# Patient Record
Sex: Male | Born: 1970 | Race: White | Hispanic: No | Smoking: Current every day smoker
Health system: Southern US, Community
[De-identification: ages and names within clinical notes are randomized; demographics above are authoritative.]

## PROBLEM LIST (undated history)

## (undated) DIAGNOSIS — R42 Dizziness and giddiness: Secondary | ICD-10-CM

## (undated) DIAGNOSIS — M359 Systemic involvement of connective tissue, unspecified: Secondary | ICD-10-CM

## (undated) DIAGNOSIS — T148XXA Other injury of unspecified body region, initial encounter: Secondary | ICD-10-CM

## (undated) DIAGNOSIS — M1990 Unspecified osteoarthritis, unspecified site: Secondary | ICD-10-CM

## (undated) DIAGNOSIS — Z8739 Personal history of other diseases of the musculoskeletal system and connective tissue: Secondary | ICD-10-CM

## (undated) DIAGNOSIS — I219 Acute myocardial infarction, unspecified: Secondary | ICD-10-CM

## (undated) DIAGNOSIS — I2510 Atherosclerotic heart disease of native coronary artery without angina pectoris: Secondary | ICD-10-CM

## (undated) DIAGNOSIS — J439 Emphysema, unspecified: Secondary | ICD-10-CM

## (undated) DIAGNOSIS — G629 Polyneuropathy, unspecified: Secondary | ICD-10-CM

## (undated) DIAGNOSIS — I2699 Other pulmonary embolism without acute cor pulmonale: Secondary | ICD-10-CM

## (undated) DIAGNOSIS — F329 Major depressive disorder, single episode, unspecified: Secondary | ICD-10-CM

## (undated) DIAGNOSIS — IMO0001 Reserved for inherently not codable concepts without codable children: Secondary | ICD-10-CM

## (undated) DIAGNOSIS — F32A Depression, unspecified: Secondary | ICD-10-CM

## (undated) DIAGNOSIS — I739 Peripheral vascular disease, unspecified: Secondary | ICD-10-CM

## (undated) DIAGNOSIS — I82409 Acute embolism and thrombosis of unspecified deep veins of unspecified lower extremity: Secondary | ICD-10-CM

## (undated) DIAGNOSIS — J45909 Unspecified asthma, uncomplicated: Secondary | ICD-10-CM

## (undated) HISTORY — DX: Peripheral vascular disease, unspecified: I73.9

## (undated) HISTORY — DX: Acute embolism and thrombosis of unspecified deep veins of unspecified lower extremity: I82.409

## (undated) HISTORY — PX: CORONARY ANGIOPLASTY WITH STENT PLACEMENT: SHX49

## (undated) HISTORY — PX: INGUINAL HERNIA REPAIR: SUR1180

## (undated) HISTORY — DX: Dizziness and giddiness: R42

## (undated) HISTORY — PX: CHEST TUBE INSERTION: SHX231

---

## 2004-04-23 ENCOUNTER — Emergency Department (HOSPITAL_COMMUNITY): Admission: EM | Admit: 2004-04-23 | Discharge: 2004-04-23 | Payer: Self-pay | Admitting: Emergency Medicine

## 2005-05-05 ENCOUNTER — Emergency Department (HOSPITAL_COMMUNITY): Admission: EM | Admit: 2005-05-05 | Discharge: 2005-05-05 | Payer: Self-pay | Admitting: Emergency Medicine

## 2010-04-16 DIAGNOSIS — I219 Acute myocardial infarction, unspecified: Secondary | ICD-10-CM

## 2010-04-16 HISTORY — PX: CARDIAC CATHETERIZATION: SHX172

## 2010-04-16 HISTORY — DX: Acute myocardial infarction, unspecified: I21.9

## 2010-09-28 ENCOUNTER — Inpatient Hospital Stay: Payer: Self-pay | Admitting: Internal Medicine

## 2010-09-28 DIAGNOSIS — R7989 Other specified abnormal findings of blood chemistry: Secondary | ICD-10-CM

## 2010-09-28 DIAGNOSIS — R079 Chest pain, unspecified: Secondary | ICD-10-CM

## 2010-09-29 DIAGNOSIS — I2510 Atherosclerotic heart disease of native coronary artery without angina pectoris: Secondary | ICD-10-CM

## 2010-10-06 ENCOUNTER — Ambulatory Visit (INDEPENDENT_AMBULATORY_CARE_PROVIDER_SITE_OTHER): Payer: Self-pay | Admitting: *Deleted

## 2010-10-06 ENCOUNTER — Encounter: Payer: Self-pay | Admitting: *Deleted

## 2010-10-06 ENCOUNTER — Telehealth: Payer: Self-pay | Admitting: Emergency Medicine

## 2010-10-06 VITALS — BP 132/88 | HR 59 | Ht 67.0 in | Wt 119.1 lb

## 2010-10-06 DIAGNOSIS — R0602 Shortness of breath: Secondary | ICD-10-CM

## 2010-10-06 DIAGNOSIS — I10 Essential (primary) hypertension: Secondary | ICD-10-CM

## 2010-10-06 NOTE — Telephone Encounter (Signed)
Spoke to pt, he had stent placed 1 week ago today after NSTEMI. Pt at this time c/o mild SOB, incr weakness, arms and legs feel slightly numb. Pt states last night he had chest pain, felt like he was having MI. Denies CP at this time. I have advised him to have his girlfriend bring him in to office now, will discuss with Dr. Shirlee Latch that is in our office today.

## 2010-10-06 NOTE — Patient Instructions (Signed)
Pt's BP WNL, EKG is unchanged from when in hospital NSR HR 59. Pt denies CP, SOB at this time. Does state he has some numbness "all over." Pt states last night he was cold/clammy and SOB. Pt was not on any BP meds prior to hospital. I have advised he purchase a BP machine and monitor over weekend, and if BP/HR low to hold Coreg. Pt states he will do this and will call back on Monday for follow up of symptoms. I have advised pt if CP/SOB return or he is unstable over weekend and NTG does not resolve that he call ambulance. Pt verbalized understanding and will continue ASA, Plavix, Lipitor and BP meds and will hold Coreg if low bp/hr. Pt is scheduled for f/u with Dr. Mariah Milling next Friday 10/13/10.

## 2010-10-06 NOTE — Telephone Encounter (Signed)
Pt stated that last night he felt like he was having another heart attack. Pt had a MI last week and Dr.Gollan saw him in hospital His vision was blurry, felt hot/sweating, chest pain and SOB. Pt stated girlfriend helped him to bed last night and he felt like he couldn't get there, stated may have blacked out. Pt doesn't remember much and he could not communicate with girlfriend on how he was feeling. Pt did not take any nitroglycerin last night.  Today patient feels very weak and SOB. Feels like legs are going numb.

## 2010-10-13 ENCOUNTER — Encounter: Payer: Self-pay | Admitting: Cardiovascular Disease

## 2010-10-13 ENCOUNTER — Ambulatory Visit (INDEPENDENT_AMBULATORY_CARE_PROVIDER_SITE_OTHER): Payer: Self-pay | Admitting: Cardiovascular Disease

## 2010-10-13 VITALS — BP 110/80 | HR 73 | Ht 67.0 in | Wt 121.0 lb

## 2010-10-13 DIAGNOSIS — I2510 Atherosclerotic heart disease of native coronary artery without angina pectoris: Secondary | ICD-10-CM

## 2010-10-13 DIAGNOSIS — F17200 Nicotine dependence, unspecified, uncomplicated: Secondary | ICD-10-CM | POA: Insufficient documentation

## 2010-10-13 MED ORDER — PRAVASTATIN SODIUM 20 MG PO TABS: 20.0000 mg | ORAL_TABLET | Freq: Every evening | ORAL | Status: DC

## 2010-10-13 NOTE — Assessment & Plan Note (Signed)
We have encouraged him to quit smoking. He smokes one half pack per day.

## 2010-10-13 NOTE — Assessment & Plan Note (Signed)
Recent stent placed to his proximal ramus branch. Bare metal stent placed as he does not have insurance. He continues to smoke. He is unable to afford Lipitor. We will start pravastatin 20 mg daily. Continue aspirin 325 for at least one month with Plavix indefinitely.

## 2010-10-13 NOTE — Progress Notes (Signed)
   [name], male    DOB: 04/12/1971, 40 y.o.   MRN: [id number]  HPI Comments: Edward [name] is a 40 year old gentleman who works in [employer], long smoking history who presented to [facility] June 14 with chest pain. Troponin was elevated to 0.78 with repeat troponin greater than 2. He was taken to the cardiac catheterization lab where he was found to have a severely stenotic 80% proximal ramus disease, 50% mid LAD disease. He had a bare-metal stent placed in his ramus vessel. He presents for routine followup to establish care in the clinic.  He reports that he had a difficult time tolerating Coreg and lisinopril. Initially he stopped his Coreg at the advice of our office and his lisinopril was cut in half. He was having dizziness and felt like he was going to pass out. He continued to feel poorly on half dose lisinopril. He stopped this several days ago and since then has felt back to normal. He is taking aspirin, Plavix, no cholesterol medicine as the Lipitor was too expensive.   He continues to smoke though has decreased the smoking from 2 packs a day to one half pack per day.  EKG shows normal sinus rhythm with rate 73 beats per minute with T wave abnormality in leads V3 through V6, one and aVL     Review of Systems  Constitutional: Negative.   HENT: Negative.   Eyes: Negative.   Respiratory: Negative.   Cardiovascular: Negative.   Gastrointestinal: Negative.   Musculoskeletal: Negative.   Skin: Negative.   Neurological: Negative.   Hematological: Negative.   Psychiatric/Behavioral: Negative.   All other systems reviewed and are negative.     BP 110/80  Pulse 73  Ht 5\' 7"  (1.702 m)  Wt 121 lb (54.885 kg)  BMI 18.95 kg/m2  Physical Exam  Nursing note and vitals reviewed. Constitutional: He is oriented to person, place, and time. He appears well-developed and well-nourished.  HENT:  Head: Normocephalic.  Nose: Nose normal.  Mouth/Throat: Oropharynx is  clear and moist.  Eyes: Conjunctivae are normal. Pupils are equal, round, and reactive to light.  Neck: Normal range of motion. Neck supple. No JVD present.  Cardiovascular: Normal rate, regular rhythm, S1 normal, S2 normal, normal heart sounds and intact distal pulses.  Exam reveals no gallop and no friction rub.   No murmur heard. Pulmonary/Chest: Effort normal and breath sounds normal. No respiratory distress. He has no wheezes. He has no rales. He exhibits no tenderness.  Abdominal: Soft. Bowel sounds are normal. He exhibits no distension. There is no tenderness.  Musculoskeletal: Normal range of motion. He exhibits no edema and no tenderness.  Lymphadenopathy:    He has no cervical adenopathy.  Neurological: He is alert and oriented to person, place, and time. Coordination normal.  Skin: Skin is warm and dry. No rash noted. No erythema.  Psychiatric: He has a normal mood and affect. His behavior is normal. Judgment and thought content normal.           Assessment and Plan

## 2010-10-13 NOTE — Patient Instructions (Signed)
You are doing well. Stop lipitor Start pravastatin 20 mg daily Decrease the aspirin to 81 mg x 2 in August, continue plavix Please call us if you have new issues that need to be addressed before your next appt.  We will call you for a follow up Appt. In 6 months

## 2010-10-20 ENCOUNTER — Encounter: Payer: Self-pay | Admitting: Cardiovascular Disease

## 2010-11-03 NOTE — Progress Notes (Signed)
  Pt's BP WNL, EKG is unchanged from when in hospital NSR HR 59. Pt denies CP, SOB at this time. Does state he has some numbness "all over." Pt states last night he was cold/clammy and SOB. Pt was not on any BP meds prior to hospital. I have advised he purchase a BP machine and monitor over weekend, and if BP/HR low to hold Coreg. Pt states he will do this and will call back on Monday for follow up of symptoms. I have advised pt if CP/SOB return or he is unstable over weekend and NTG does not resolve that he call ambulance. Pt verbalized understanding and will continue ASA, Plavix, Lipitor and BP meds and will hold Coreg if low bp/hr. Pt is scheduled for f/u with Dr. Mariah Milling next Friday 10/13/10.

## 2010-11-06 ENCOUNTER — Other Ambulatory Visit: Payer: Self-pay | Admitting: Cardiovascular Disease

## 2010-11-08 ENCOUNTER — Other Ambulatory Visit: Payer: Self-pay | Admitting: Cardiovascular Disease

## 2010-11-08 NOTE — Telephone Encounter (Signed)
Patient is completely out of this medication and states that he requested a refill several days ago.  Please call once this is complete.

## 2010-11-09 ENCOUNTER — Telehealth: Payer: Self-pay

## 2010-11-09 MED ORDER — CLOPIDOGREL BISULFATE 75 MG PO TABS: 75.0000 mg | ORAL_TABLET | Freq: Every day | ORAL | Status: DC

## 2010-11-09 NOTE — Telephone Encounter (Signed)
Needs a refill sent for plavix 75 mg one tablet daily.

## 2010-11-09 NOTE — Telephone Encounter (Signed)
Needs a refill on plavix 75 mg take one tablet daily.

## 2010-11-09 NOTE — Telephone Encounter (Signed)
Needs a refill on plavix.

## 2010-11-10 ENCOUNTER — Encounter: Payer: Self-pay | Admitting: Cardiovascular Disease

## 2011-05-02 ENCOUNTER — Ambulatory Visit (INDEPENDENT_AMBULATORY_CARE_PROVIDER_SITE_OTHER): Payer: Self-pay | Admitting: Cardiovascular Disease

## 2011-05-02 ENCOUNTER — Encounter: Payer: Self-pay | Admitting: Cardiovascular Disease

## 2011-05-02 VITALS — BP 120/82 | HR 74 | Ht 66.0 in | Wt 132.0 lb

## 2011-05-02 DIAGNOSIS — F17200 Nicotine dependence, unspecified, uncomplicated: Secondary | ICD-10-CM

## 2011-05-02 DIAGNOSIS — I2510 Atherosclerotic heart disease of native coronary artery without angina pectoris: Secondary | ICD-10-CM

## 2011-05-02 DIAGNOSIS — M109 Gout, unspecified: Secondary | ICD-10-CM | POA: Insufficient documentation

## 2011-05-02 DIAGNOSIS — I214 Non-ST elevation (NSTEMI) myocardial infarction: Secondary | ICD-10-CM

## 2011-05-02 DIAGNOSIS — E785 Hyperlipidemia, unspecified: Secondary | ICD-10-CM | POA: Insufficient documentation

## 2011-05-02 MED ORDER — COLCHICINE 0.6 MG PO TABS: 0.6000 mg | ORAL_TABLET | Freq: Three times a day (TID) | ORAL | Status: DC | PRN

## 2011-05-02 MED ORDER — CLOPIDOGREL BISULFATE 75 MG PO TABS: 75.0000 mg | ORAL_TABLET | Freq: Every day | ORAL | Status: DC

## 2011-05-02 MED ORDER — PRAVASTATIN SODIUM 20 MG PO TABS: 20.0000 mg | ORAL_TABLET | Freq: Every evening | ORAL | Status: DC

## 2011-05-02 NOTE — Assessment & Plan Note (Signed)
We have suggested we check his cholesterol and LFTs in the next few weeks at his convenience.

## 2011-05-02 NOTE — Assessment & Plan Note (Signed)
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 We have suggested he restart low-dose aspirin 81 mg daily with Plavix.  He does not want to take lisinopril or b-blockers. Risk and benefit was discussed with him.

## 2011-05-02 NOTE — Assessment & Plan Note (Signed)
He does report having a long history of gout in his big toe which is worse in the winter. He does not have a primary care physician. We have given him a prescription for colchicine to take p.r.n.. He did not tolerate NSAIDs such as aspirin in the past secondary to stomach irritation. We will avoid indomethacin. Also avoid steroids.

## 2011-05-02 NOTE — Assessment & Plan Note (Signed)
He continues to smoke one pack per day. We have encouraged him to continue to work on weaning his cigarettes and smoking cessation. He will continue to work on this and does not want any assistance with chantix.

## 2011-05-02 NOTE — Patient Instructions (Signed)
You are doing well. Please restart the aspirin 81 coated.  Please call us if you have new issues that need to be addressed before your next appt.  Your physician wants you to follow-up in: 6 months.  You will receive a reminder letter in the mail two months in advance. If you don't receive a letter, please call our office to schedule the follow-up appointment.

## 2011-05-02 NOTE — Progress Notes (Signed)
[name], male    DOB: Mar 01, 1971, 41 y.o.   MRN: [id number]  HPI Comments: Edward [name] is a 41 year old gentleman who works in [employer], long smoking history who presented to [facility] September 28 2010 with chest pain. Troponin was elevated to 0.78 with repeat troponin greater than 2. He was taken to the cardiac catheterization lab where he was found to have a severely stenotic 80% proximal ramus disease, 50% mid LAD disease. He had a bare-metal stent placed in his ramus vessel. He presents for routine followup.  He continues to smoke one pack per day. He does have occasional episodes of hand numbness and arm discomfort which was his previous anginal equivalent. It is not often but he does continue to have very mild symptoms. He is not very worried about the symptoms at this time. He continues to work long hours in [employer]. He stopped taking lisinopril secondary to not feeling well. He has not been continuing his Coreg despite our advice to continue a low dose.  He stopped aspirin as he reports it hurt his stomach. He has been taking Plavix, pravastatin 20 mg daily.  EKG shows normal sinus rhythm with rate 74 beats per minute with No significant ST or T wave changes   Outpatient Encounter Prescriptions as of 05/02/2011  Medication Sig Dispense Refill  . clopidogrel (PLAVIX) 75 MG tablet Take 1 tablet (75 mg total) by mouth daily.  30 tablet  12  . nitroGLYCERIN (NITROSTAT) 0.4 MG SL tablet Place 0.4 mg under the tongue every 5 (five) minutes as needed.        . pravastatin (PRAVACHOL) 20 MG tablet Take 1 tablet (20 mg total) by mouth every evening.  90 tablet  4     Review of Systems  Constitutional: Negative.   HENT: Negative.   Eyes: Negative.   Respiratory: Negative.   Cardiovascular: Negative.   Gastrointestinal: Negative.   Musculoskeletal: Negative.   Skin: Negative.   Neurological: Negative.   Hematological: Negative.   Psychiatric/Behavioral: Negative.    All other systems reviewed and are negative.     BP 120/82  Pulse 74  Ht 5\' 6"  (1.676 m)  Wt 132 lb (59.875 kg)  BMI 21.31 kg/m2  Physical Exam  Nursing note and vitals reviewed. Constitutional: He is oriented to person, place, and time. He appears well-developed and well-nourished.  HENT:  Head: Normocephalic.  Nose: Nose normal.  Mouth/Throat: Oropharynx is clear and moist.  Eyes: Conjunctivae are normal. Pupils are equal, round, and reactive to light.  Neck: Normal range of motion. Neck supple. No JVD present.  Cardiovascular: Normal rate, regular rhythm, S1 normal, S2 normal, normal heart sounds and intact distal pulses.  Exam reveals no gallop and no friction rub.   No murmur heard. Pulmonary/Chest: Effort normal and breath sounds normal. No respiratory distress. He has no wheezes. He has no rales. He exhibits no tenderness.  Abdominal: Soft. Bowel sounds are normal. He exhibits no distension. There is no tenderness.  Musculoskeletal: Normal range of motion. He exhibits no edema and no tenderness.  Lymphadenopathy:    He has no cervical adenopathy.  Neurological: He is alert and oriented to person, place, and time. Coordination normal.  Skin: Skin is warm and dry. No rash noted. No erythema.  Psychiatric: He has a normal mood and affect. His behavior is normal. Judgment and thought content normal.           Assessment and Plan

## 2011-06-22 ENCOUNTER — Emergency Department: Payer: Self-pay | Admitting: Emergency Medicine

## 2011-06-22 LAB — CBC
HCT: 46.3 % (ref 40.0–52.0)
HGB: 16.2 g/dL (ref 13.0–18.0)
MCH: 34 pg (ref 26.0–34.0)
MCHC: 34.9 g/dL (ref 32.0–36.0)
MCV: 97 fL (ref 80–100)
Platelet: 192 10*3/uL (ref 150–440)
RBC: 4.77 10*6/uL (ref 4.40–5.90)
RDW: 12.4 % (ref 11.5–14.5)
WBC: 6.7 10*3/uL (ref 3.8–10.6)

## 2011-06-22 LAB — BASIC METABOLIC PANEL
Anion Gap: 10 (ref 7–16)
BUN: 16 mg/dL (ref 7–18)
Calcium, Total: 9.1 mg/dL (ref 8.5–10.1)
Chloride: 106 mmol/L (ref 98–107)
Co2: 27 mmol/L (ref 21–32)
Creatinine: 1.11 mg/dL (ref 0.60–1.30)
EGFR (African American): >60
EGFR (Non-African Amer.): >60
Glucose: 84 mg/dL (ref 65–99)
Osmolality: 285 (ref 275–301)
Potassium: 3.9 mmol/L (ref 3.5–5.1)
Sodium: 143 mmol/L (ref 136–145)

## 2011-06-22 LAB — URINALYSIS, COMPLETE
Bilirubin,UR: NEGATIVE
Blood: NEGATIVE
Glucose,UR: NEGATIVE mg/dL (ref 0–75)
Ketone: NEGATIVE
Leukocyte Esterase: NEGATIVE
Nitrite: NEGATIVE
Ph: 7 (ref 4.5–8.0)
Protein: NEGATIVE
RBC,UR: NONE SEEN /HPF (ref 0–5)
Specific Gravity: 1.005 (ref 1.003–1.030)
Squamous Epithelial: <1
WBC UR: NONE SEEN /HPF (ref 0–5)

## 2011-07-03 ENCOUNTER — Telehealth: Payer: Self-pay | Admitting: Cardiovascular Disease

## 2011-07-03 NOTE — Telephone Encounter (Signed)
Will forward to Dr Gollan  

## 2011-07-03 NOTE — Telephone Encounter (Signed)
Pt needs surgical clearance for hernia repair. Per Dr Natale Lay ofc [phone] Fax  [phone]

## 2011-07-03 NOTE — Telephone Encounter (Signed)
Ok to have surgery, acceptable risk Will need to hold his Plavix 5 days before the surgery Would continue on aspirin through the surgery and postoperatively. Restart Plavix after surgery is complete

## 2011-07-04 NOTE — Telephone Encounter (Signed)
Faxed note to [facility].  N/A at pt number.

## 2011-07-05 NOTE — Telephone Encounter (Signed)
Spoke with Edward [name] regarding pt's surgical clearance. Dr. Windell Hummingbird note faxed to [phone].

## 2011-07-12 ENCOUNTER — Ambulatory Visit: Payer: Self-pay | Admitting: Surgery

## 2011-07-20 ENCOUNTER — Ambulatory Visit: Payer: Self-pay | Admitting: Surgery

## 2011-07-25 LAB — PATHOLOGY REPORT

## 2014-04-16 DIAGNOSIS — I2699 Other pulmonary embolism without acute cor pulmonale: Secondary | ICD-10-CM

## 2014-04-16 HISTORY — DX: Other pulmonary embolism without acute cor pulmonale: I26.99

## 2014-07-26 ENCOUNTER — Emergency Department (HOSPITAL_COMMUNITY)
Admission: EM | Admit: 2014-07-26 | Discharge: 2014-07-26 | Disposition: A | Discharge status: Home / Self Care | Payer: Self-pay | Attending: Emergency Medicine | Admitting: Emergency Medicine

## 2014-07-26 ENCOUNTER — Encounter (HOSPITAL_COMMUNITY): Payer: Self-pay | Admitting: Cardiology

## 2014-07-26 DIAGNOSIS — Z7902 Long term (current) use of antithrombotics/antiplatelets: Secondary | ICD-10-CM | POA: Insufficient documentation

## 2014-07-26 DIAGNOSIS — Z720 Tobacco use: Secondary | ICD-10-CM | POA: Insufficient documentation

## 2014-07-26 DIAGNOSIS — R202 Paresthesia of skin: Secondary | ICD-10-CM | POA: Insufficient documentation

## 2014-07-26 DIAGNOSIS — J439 Emphysema, unspecified: Secondary | ICD-10-CM | POA: Insufficient documentation

## 2014-07-26 DIAGNOSIS — Z79899 Other long term (current) drug therapy: Secondary | ICD-10-CM | POA: Insufficient documentation

## 2014-07-26 LAB — CBG MONITORING, ED: Glucose-Capillary: 69 mg/dL — ABNORMAL LOW (ref 70–99)

## 2014-07-26 NOTE — Discharge Instructions (Signed)
Your vital signs are within normal limits. There no gross neurologic or vascular changes noted on your examination at this time. Please see Dr. Romeo AppleHarrison for additional evaluation concerning the numbness sensation in your lower extremity. Paresthesia Paresthesia is a burning or prickling feeling. This feeling can happen in any part of the body. It often happens in the hands, arms, legs, or feet. HOME CARE  Avoid drinking alcohol.  Try massage or needle therapy (acupuncture) to help with your problems.  Keep all doctor visits as told. GET HELP RIGHT AWAY IF:   You feel weak.  You have trouble walking or moving.  You have problems speaking or seeing.  You feel confused.  You cannot control when you poop (bowel movement) or pee (urinate).  You lose feeling (numbness) after an injury.  You pass out (faint).  Your burning or prickling feeling gets worse when you walk.  You have pain, cramps, or feel dizzy.  You have a rash. MAKE SURE YOU:   Understand these instructions.  Will watch your condition.  Will get help right away if you are not doing well or get worse. Document Released: 03/15/2008 Document Revised: 06/25/2011 Document Reviewed: 12/22/2010 [facility] Patient Information 2015 [place]. This information is not intended to replace advice given to you by your health care provider. Make sure you discuss any questions you have with your health care provider.

## 2014-07-26 NOTE — ED Notes (Signed)
Right foot and lower leg pain since Saturday.  Denies any injury.

## 2014-07-26 NOTE — ED Provider Notes (Signed)
CSN: [id number]     Arrival date & time 07/26/14  1419 History  This chart was scribed for non-physician practitioner, Ivery Quale, working with Shon Baton, MD by Richarda Overlie, ED Scribe. This patient was seen in room APFT20/APFT20 and the patient's care was started at 2:44 PM.   Chief Complaint  Patient presents with  . Foot Pain   HPI HPI Comments: Edward [name] is a 44 y.o. male with a history of emphysema and MI who presents to the Emergency Department complaining of gradual, unchanged right foot pain that started 2 days ago. Pt describes his pain as throbbing. He denies any injury. Pt states that he started to experience a numbness sensation in his right calf and right foot at the time of onset that is still present. He reports that he has frequently experienced numbness in his hands and legs in the past but has never been diagnosed. Pt denies bowel incontinence during the onset of his symptoms. Pt denies any numbness in his rectum or pelvis. He reports no history of RLE surgery.   Past Medical History  Diagnosis Date  . Pneumothorax 2006    s/p chest tube placement x 2   . Tobacco abuse     25 year   . Acute bronchitis   . Emphysema    Past Surgical History  Procedure Laterality Date  . Cardiac catheterization  2012    s/p stent placement   History reviewed. No pertinent family history. History  Substance Use Topics  . Smoking status: Current Every Day Smoker -- 0.50 packs/day for 25 years    Types: Cigarettes  . Smokeless tobacco: Never Used  . Alcohol Use: No    Review of Systems  Musculoskeletal: Positive for arthralgias.  Neurological: Positive for numbness.  All other systems reviewed and are negative.  Allergies  Lisinopril  Home Medications   Prior to Admission medications   Medication Sig Start Date End Date Taking? Authorizing Provider  clopidogrel (PLAVIX) 75 MG tablet Take 1 tablet (75 mg total) by mouth daily. 05/02/11   Antonieta Iba, MD   colchicine 0.6 MG tablet Take 1 tablet (0.6 mg total) by mouth 3 (three) times daily as needed. 05/02/11 05/01/12  Antonieta Iba, MD  nitroGLYCERIN (NITROSTAT) 0.4 MG SL tablet Place 0.4 mg under the tongue every 5 (five) minutes as needed.      Historical Provider, MD  pravastatin (PRAVACHOL) 20 MG tablet Take 1 tablet (20 mg total) by mouth every evening. 05/02/11 05/01/12  Antonieta Iba, MD   BP 144/88 mmHg  Pulse 85  Temp(Src) 98.1 F (36.7 C) (Oral)  Resp 18  Ht  (1.676 m)  Wt 124 lb (56.246 kg)  BMI 20.02 kg/m2  SpO2 99%   Physical Exam  Constitutional: He is oriented to person, place, and time. He appears well-developed and well-nourished.  HENT:  Head: Normocephalic and atraumatic.  Eyes: Right eye exhibits no discharge. Left eye exhibits no discharge.  Neck: Neck supple. No tracheal deviation present.  Cardiovascular: Normal rate, regular rhythm and normal heart sounds.   Pulmonary/Chest: Effort normal. No respiratory distress. He has no wheezes. He has no rales.  Coarse breath sounds.   Abdominal: He exhibits no distension.  Musculoskeletal:  Right achilles tendon is intact. There are no temperature changes of RLE. No edema or redness. Good hair growth. Posterior tibial pulse and dorsalis pedis pulses are 2+. Capillary refill is less than 3 seconds. No temperature changes of the foot.  No lesions between the toes. No puncture wounds of the plantar surface.   Neurological: He is alert and oriented to person, place, and time.  Motor strength of lower extremities is symmetrical.  Skin: Skin is warm and dry.  Psychiatric: He has a normal mood and affect. His behavior is normal.  Nursing note and vitals reviewed.  ED Course  Procedures   DIAGNOSTIC STUDIES: Oxygen Saturation is 99% on RA, normal by my interpretation.    COORDINATION OF CARE: 2:56 PM Discussed treatment plan with pt at bedside and pt agreed to plan.   Labs Review Labs Reviewed - No data to  display  Imaging Review No results found.   EKG Interpretation None      MDM  No hx of DM. Hx of ETOH. 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 No medication changes. No injury or trauma. No redness or swelling of the Right Lower ext. Neg Homan's sign. Doubt DVT. DP and PT pulse 2+. No significant change if hair growth pattern.  No color changes or the lower extremities.Doubt circulation issue.  Pt to follow up with orthopedics for additional evaluation. He will return to ED if any changes or problem.    Final diagnoses:  None    **I have reviewed nursing notes, vital signs, and all appropriate lab and imaging results for this patient.     Ivery QualeHobson Jalani Cullifer, PA-C 07/28/14 [id number]  Ivery QualeHobson Duey Liller, PA-C 07/28/14 [id number]  Shon Batonourtney F Horton, MD 07/30/14 [phone]

## 2014-08-08 NOTE — Op Note (Signed)
PATIENT NAME:  Edward [name], Edward [name] MR#:  [id number] DATE OF BIRTH:  03/09/1971  DATE OF PROCEDURE:  07/20/2011  PREOPERATIVE DIAGNOSIS: Right inguinal hernia.   POSTOPERATIVE DIAGNOSIS: Small right indirect inguinal hernia.   PROCEDURE PERFORMED: Right groin exploration with high ligation of small indirect inguinal hernia AND resection of ilioinguinal nerve.   SURGEON: Natale LayMark Blythe Hartshorn, M.[name].   ANESTHESIA: General with LMA and 30 mL of 0.25% plain Marcaine.   SPECIMENS: As described above.   ESTIMATED BLOOD LOSS: Minimal.   DESCRIPTION OF PROCEDURE: With the patient in the supine position, general anesthesia was induced with LMA airway. The right groin was clipped of hair, prepped and draped with ChloraPrep followed by [name]. Time out procedure was observed. A short transversely oriented incision was fashioned in the right groin with scalpel and carried through Scarpa's fascia with electrocautery. Small bridging veins were controlled with point cautery. The external oblique aponeurosis was identified and incised laterally with scalpel. The incision was carried through the external ring. The spermatic cord was diminutive in size and elevated with blunt dissection, at the pubic tubercle. The floor of the inguinal canal was strong; no evidence of a direct inguinal hernia. Dissection of the cord structures demonstrated a diminutive lipoma. This was excised and discarded. Dissection in the area of an indirect inguinal hernia demonstrated a 2 cm hernia sac which was then opened, and approximately 5 mm in width, doubly ligated at its base, transected, and submitted as specimen. The course of the ilioinguinal nerve was identified, and a 4 cm segment was excised with the proximal aspect being suture ligated with 3-0 chromic suture. With lap and needle count correct x2, a total of 30 mL of 0.25% plain Marcaine was utilized during the case, including an ilioinguinal field block. The external oblique aponeurosis was then  reapproximated utilizing a running #0 Vicryl suture. Scarpa's fascia was reapproximated with 2-0 Vicryl suture. Deep dermal 3-0 Vicryl suture was placed in running fashion followed by running 4-0 Vicryl subcuticular in the skin, Benzoin, Steri-Strips, and an occlusive sterile dressing. The patient was then subsequently extubated and sent to the recovery room in stable and satisfactory condition by anesthesia services.  ____________________________ Redge GainerMark A. Egbert GaribaldiBird, MD mab:slb [name]: 07/20/2011 16:40:09 ET T: 07/21/2011 11:04:07 ET JOB#: [id number]  cc: Loraine LericheMark A. Egbert GaribaldiBird, MD, <Dictator> Raynald KempMARK A Zaniah Titterington MD ELECTRONICALLY SIGNED 07/21/2011 16:30

## 2014-09-17 ENCOUNTER — Emergency Department (HOSPITAL_COMMUNITY): Payer: PRIVATE HEALTH INSURANCE

## 2014-09-17 ENCOUNTER — Encounter (HOSPITAL_COMMUNITY): Payer: Self-pay

## 2014-09-17 ENCOUNTER — Inpatient Hospital Stay (HOSPITAL_COMMUNITY)
Admission: EM | Admit: 2014-09-17 | Discharge: 2014-09-18 | DRG: 176 | Disposition: A | Discharge status: Home / Self Care | Payer: PRIVATE HEALTH INSURANCE | Attending: Internal Medicine | Admitting: Internal Medicine

## 2014-09-17 DIAGNOSIS — F17210 Nicotine dependence, cigarettes, uncomplicated: Secondary | ICD-10-CM | POA: Diagnosis present

## 2014-09-17 DIAGNOSIS — E785 Hyperlipidemia, unspecified: Secondary | ICD-10-CM | POA: Diagnosis present

## 2014-09-17 DIAGNOSIS — I2699 Other pulmonary embolism without acute cor pulmonale: Principal | ICD-10-CM | POA: Diagnosis present

## 2014-09-17 DIAGNOSIS — I2510 Atherosclerotic heart disease of native coronary artery without angina pectoris: Secondary | ICD-10-CM | POA: Diagnosis present

## 2014-09-17 DIAGNOSIS — Z955 Presence of coronary angioplasty implant and graft: Secondary | ICD-10-CM | POA: Diagnosis not present

## 2014-09-17 DIAGNOSIS — R079 Chest pain, unspecified: Secondary | ICD-10-CM | POA: Diagnosis not present

## 2014-09-17 DIAGNOSIS — J439 Emphysema, unspecified: Secondary | ICD-10-CM | POA: Diagnosis present

## 2014-09-17 DIAGNOSIS — Z825 Family history of asthma and other chronic lower respiratory diseases: Secondary | ICD-10-CM

## 2014-09-17 DIAGNOSIS — I82409 Acute embolism and thrombosis of unspecified deep veins of unspecified lower extremity: Secondary | ICD-10-CM

## 2014-09-17 LAB — CBC WITH DIFFERENTIAL/PLATELET
Basophils Absolute: 0 10*3/uL (ref 0.0–0.1)
Basophils Relative: 0 % (ref 0–1)
Eosinophils Absolute: 0.2 10*3/uL (ref 0.0–0.7)
Eosinophils Relative: 2 % (ref 0–5)
HCT: 45.5 % (ref 39.0–52.0)
Hemoglobin: 15.2 g/dL (ref 13.0–17.0)
Lymphocytes Relative: 16 % (ref 12–46)
Lymphs Abs: 1.5 10*3/uL (ref 0.7–4.0)
MCH: 33.3 pg (ref 26.0–34.0)
MCHC: 33.4 g/dL (ref 30.0–36.0)
MCV: 99.8 fL (ref 78.0–100.0)
Monocytes Absolute: 1 10*3/uL (ref 0.1–1.0)
Monocytes Relative: 11 % (ref 3–12)
Neutro Abs: 6.8 10*3/uL (ref 1.7–7.7)
Neutrophils Relative %: 71 % (ref 43–77)
Platelets: 145 10*3/uL — ABNORMAL LOW (ref 150–400)
RBC: 4.56 MIL/uL (ref 4.22–5.81)
RDW: 13.5 % (ref 11.5–15.5)
WBC: 9.4 10*3/uL (ref 4.0–10.5)

## 2014-09-17 LAB — BASIC METABOLIC PANEL
Anion gap: 10 (ref 5–15)
BUN: 20 mg/dL (ref 6–20)
CO2: 26 mmol/L (ref 22–32)
Calcium: 8.6 mg/dL — ABNORMAL LOW (ref 8.9–10.3)
Chloride: 98 mmol/L — ABNORMAL LOW (ref 101–111)
Creatinine, Ser: 0.93 mg/dL (ref 0.61–1.24)
GFR calc Af Amer: >60 mL/min (ref >60)
GFR calc non Af Amer: >60 mL/min (ref >60)
Glucose, Bld: 101 mg/dL — ABNORMAL HIGH (ref 65–99)
Potassium: 3.7 mmol/L (ref 3.5–5.1)
Sodium: 134 mmol/L — ABNORMAL LOW (ref 135–145)

## 2014-09-17 LAB — TROPONIN I: Troponin I: <0.03 ng/mL (ref <0.031)

## 2014-09-17 MED ORDER — IOHEXOL 350 MG/ML SOLN
100.0000 mL | Freq: Once | INTRAVENOUS | Status: AC | PRN
  Administered 2014-09-17: 100 mL via INTRAVENOUS

## 2014-09-17 MED ORDER — AZITHROMYCIN 500 MG IV SOLR
500.0000 mg | INTRAVENOUS | Status: DC
  Administered 2014-09-17: 500 mg via INTRAVENOUS
  Filled 2014-09-17: qty 500

## 2014-09-17 MED ORDER — MORPHINE SULFATE 4 MG/ML IJ SOLN
6.0000 mg | Freq: Once | INTRAMUSCULAR | Status: AC
  Administered 2014-09-17: 6 mg via INTRAVENOUS
  Filled 2014-09-17: qty 2

## 2014-09-17 MED ORDER — DEXTROSE 5 % IV SOLN
1.0000 g | Freq: Once | INTRAVENOUS | Status: DC
  Administered 2014-09-17: 1 g via INTRAVENOUS
  Filled 2014-09-17: qty 10

## 2014-09-17 MED ORDER — HYDROMORPHONE HCL 1 MG/ML IJ SOLN
1.0000 mg | Freq: Once | INTRAMUSCULAR | Status: AC
  Administered 2014-09-17: 1 mg via INTRAVENOUS
  Filled 2014-09-17: qty 1

## 2014-09-17 MED ORDER — KETOROLAC TROMETHAMINE 30 MG/ML IJ SOLN
15.0000 mg | Freq: Once | INTRAMUSCULAR | Status: AC
  Administered 2014-09-17: 15 mg via INTRAVENOUS
  Filled 2014-09-17: qty 1

## 2014-09-17 MED ORDER — SODIUM CHLORIDE 0.9 % IV BOLUS (SEPSIS)
1000.0000 mL | Freq: Once | INTRAVENOUS | Status: AC
  Administered 2014-09-17: 1000 mL via INTRAVENOUS

## 2014-09-17 MED ORDER — ENOXAPARIN SODIUM 80 MG/0.8ML ~~LOC~~ SOLN
1.0000 mg/kg | Freq: Once | SUBCUTANEOUS | Status: AC
  Administered 2014-09-17: 55 mg via SUBCUTANEOUS
  Filled 2014-09-17: qty 0.8

## 2014-09-17 NOTE — ED Notes (Signed)
Pt reports was at work yesterday and had sudden onset of r upper back pain and sob.  Reports has had a cough, productive at times for the past week.  Denies fever.   Reports ago spit up blood.   Reports was recently on prednisone for foot pain.   Pt says has history of pneumothorax in left lung and says the pain is similar.

## 2014-09-17 NOTE — ED Notes (Signed)
MD at bedside. 

## 2014-09-17 NOTE — H&P (Signed)
PCP:   No PCP Per Patient   Chief Complaint:  Shortness of breath  HPI:  44 year old male who  has a past medical history of Pneumothorax (2006); Tobacco abuse; Acute bronchitis; and Emphysema. He presents to the ED with a chief complaint of right-sided chest pain and shortness of breath. Patient says the pain is worse on the bleeding. Started sudden onset yesterday. He did cough up blood twice today. Patient also complains of ongoing pain in the right lower extremity. Last months with swelling which has now resolved. Chest x-ray showed small right pleural effusion with small patchy infiltrate at the right lung base posteriorly, as there was a concern for possible PE so CT angiogram of the chest was done which didn't show pulmonary embolism. A shunt started on Lovenox per pharmacy consultation.  Patient denies recent surgery, long distance travel, family history of hypercoagulable state, no history of malignancy in the  patient He denies nausea vomiting diarrhea. Denies dysuria urgency frequency of urination. Denies fever.  Allergies:   Allergies  Allergen Reactions  . Lisinopril     "dropped my blood pressure too low"      Past Medical History  Diagnosis Date  . Pneumothorax 2006    s/p chest tube placement x 2   . Tobacco abuse     25 year   . Acute bronchitis   . Emphysema     Past Surgical History  Procedure Laterality Date  . Cardiac catheterization  2012    s/p stent placement  . Chest tube insertion      Prior to Admission medications   Medication Sig Start Date End Date Taking? Authorizing Provider  HYDROcodone-acetaminophen (NORCO/VICODIN) 5-325 MG per tablet Take 1 tablet by mouth every 4 (four) hours as needed. 08/31/14 09/29/14 Yes Historical Provider, MD  clopidogrel (PLAVIX) 75 MG tablet Take 1 tablet (75 mg total) by mouth daily. Patient not taking: Reported on 09/17/2014 05/02/11   Antonieta Iba, MD  nitroGLYCERIN (NITROSTAT) 0.4 MG SL tablet Place 0.4 mg  under the tongue every 5 (five) minutes as needed.      Historical Provider, MD    Social History:  reports that he has been smoking Cigarettes.  He has a 12.5 pack-year smoking history. He has never used smokeless tobacco. He reports that he does not drink alcohol or use illicit drugs.  Patient's father died of brain tumor, patient's mother has a history of COPD No family history of hypercoagulable state  All the positives are listed in BOLD  Review of Systems:  HEENT: Headache, blurred vision, runny nose, sore throat Neck: Hypothyroidism, hyperthyroidism,,lymphadenopathy Chest : Shortness of breath, history of COPD, Asthma Heart : Chest pain, history of coronary arterey disease GI:  Nausea, vomiting, diarrhea, constipation, GERD GU: Dysuria, urgency, frequency of urination, hematuria Neuro: Stroke, seizures, syncope Psych: Depression, anxiety, hallucinations   Physical Exam: Blood pressure 142/89, pulse 89, temperature 98.5 F (36.9 C), temperature source Oral, resp. rate 21, height  (1.676 m), weight 54.432 kg (120 lb), SpO2 98 %. Constitutional:   Patient is a well-developed and well-nourished male* in no acute distress and cooperative with exam. Head: Normocephalic and atraumatic Mouth: Mucus membranes moist Eyes: PERRL, EOMI, conjunctivae normal Neck: Supple, No Thyromegaly Cardiovascular: RRR, S1 normal, S2 normal Pulmonary/Chest: CTAB, no wheezes, rales, or rhonchi Abdominal: Soft. Non-tender, non-distended, bowel sounds are normal, no masses, organomegaly, or guarding present.  Neurological: A&O x3, Strength is normal and symmetric bilaterally, cranial nerve II-XII are grossly intact,  no focal motor deficit, sensory intact to light touch bilaterally.  Extremities : No Cyanosis, Clubbing or Edema. Homans sign positive in the left calf  Labs on Admission:  Basic Metabolic Panel:  Recent Labs Lab 09/17/14 1835  NA 134*  K 3.7  CL 98*  CO2 26  GLUCOSE 101*    BUN 20  CREATININE 0.93  CALCIUM 8.6*   CBC:  Recent Labs Lab 09/17/14 1835  WBC 9.4  NEUTROABS 6.8  HGB 15.2  HCT 45.5  MCV 99.8  PLT 145*   Cardiac Enzymes:  Recent Labs Lab 09/17/14 1835  TROPONINI <0.03     Radiological Exams on Admission: Dg Chest 2 View  09/17/2014   CLINICAL DATA:  Increasing shortness of breath. Right-sided chest pain. Previous pneumothorax.  EXAM: CHEST  2 VIEW  COMPARISON:  08/30/2005  FINDINGS: There is small right pleural effusion and on the lateral view there appears to be a small infiltrate posteriorly.  There is peribronchial thickening.  Heart size and pulmonary vascularity are normal. Blebs are present at the lung apices. No pneumothorax. No osseous abnormality. No infiltrates in the left lung.  IMPRESSION: Small right pleural effusion with small patchy infiltrate at the right lung base posteriorly. Bronchitic changes.   Electronically Signed   By: Francene Boyers M.D.   On: 09/17/2014 18:14   Ct Angio Chest Pe W/cm &/or Wo Cm  09/17/2014   CLINICAL DATA:  Right upper back pain.  Shortness of breath.  EXAM: CT ANGIOGRAPHY CHEST WITH CONTRAST  TECHNIQUE: Multidetector CT imaging of the chest was performed using the standard protocol during bolus administration of intravenous contrast. Multiplanar CT image reconstructions and MIPs were obtained to evaluate the vascular anatomy.  CONTRAST:  OMNIPAQUE IOHEXOL 350 MG/ML SOLN  COMPARISON:  Two-view chest x-ray from the same day.  FINDINGS: Pulmonary arterial opacification is satisfactory. Pulmonary emboli are noted within the posterior basilar pulmonary arteries in the right lower lobe. No other significant emboli are present on the right. There are no significant emboli on the left. Heart size is normal. There is no evidence for cardiac strain. No significant mediastinal or axillary adenopathy is present. Limited imaging of the upper abdomen is unremarkable.  Ill-defined airspace disease in the  posterior basilar segment of the right lower lobe is likely related to the embolus and ischemia. A small right pleural effusion is noted. A 3 mm nodule is present along the right major fissure. No other focal nodule, mass, or airspace disease is present.  The bone windows demonstrate no focal lytic or blastic lesions.  Review of the MIP images confirms the above findings.  IMPRESSION: 1. Posterior basilar segmental pulmonary emboli. 2. Ill-defined airspace disease within the posterior basilar segment of the right lower lobe likely represents ischemic change. 3. Small right pleural effusion. 4. 3 mm pleural-based nodule along the right major fissure. If the patient is at high risk for bronchogenic carcinoma, follow-up chest CT at 1 year is recommended. If the patient is at low risk, no follow-up is needed. This recommendation follows the consensus statement: Guidelines for Management of Small Pulmonary Nodules Detected on CT Scans: A Statement from the Fleischner Society as published in Radiology 2005; 237:395-400. These results were called by telephone at the time of interpretation on 09/17/2014 at 7:33 pm to Dr. Raeford Razor , who verbally acknowledged these results.   Electronically Signed   By: Marin Roberts M.D.   On: 09/17/2014 19:35    EKG: Independently reviewed. Sinus rhythm  Assessment/Plan Active Problems:   CAD (coronary artery disease)   Hyperlipidemia   Pulmonary embolism  Pulmonary embolism Patient come in with chest pain and CT angiogram showing posterior basilar segmental pulmonary emboli. Will start the patient on Lovenox per pharmacy consultation. As it is a concern for possible DVT would also obtain bilateral venous duplex and a.m. Hypercoagulable workup has been initiated in the ED. Follow labs Will need to discuss the pros and cons of starting Novel anticoagulant versus Coumadin.  Pleural-based nodule C deep ulcer shows 4.3 mm pleural-based nodule along the right major  fissure. Patient will need a follow-up CT chest in one year to assess the nodule.  Coronary artery disease Patient has a coronary stent in place, continue Plavix  Code status: Full code  Family discussion: No family present at bedside   Time Spent on Admission: 60 minutes  Jury Caserta S [facility] Pager: [phone] 09/17/2014, 9:25 PM  If 7PM-7AM, please contact night-coverage  [email or web address]  Password [email or web address]

## 2014-09-17 NOTE — ED Provider Notes (Signed)
CSN: [id number]     Arrival date & time 09/17/14  1720 History   First MD Initiated Contact with Patient 09/17/14 1730     Chief Complaint  Patient presents with  . Shortness of Breath     (Consider location/radiation/quality/duration/timing/severity/associated sxs/prior Treatment) HPI   44 year old male with chest pain. Right sided. Describes a sharp. Worse with coughing and deep breathing. Sudden onset yesterday. Persistent since then. Radiates to right upper back. Mild shortness of breath. No fevers or chills. Did cough up a small amount of blood shortly before arrival. Patient has a past history of spontaneous pneumothorax and states current symptoms feel pretty similar. He is a smoker. No history of DVT/PE. He does report ongoing right lower extremity pain for the past 2 months without clear etiology. Pain primarily in the foot and radiates up into his calf. He intermittently has some swelling in his right foot and ankle.  Past Medical History  Diagnosis Date  . Pneumothorax 2006    s/p chest tube placement x 2   . Tobacco abuse     27 year   . Acute bronchitis   . Emphysema    Past Surgical History  Procedure Laterality Date  . Cardiac catheterization  2012    s/p stent placement  . Chest tube insertion     No family history on file. History  Substance Use Topics  . Smoking status: Current Every Day Smoker -- 0.50 packs/day for 25 years    Types: Cigarettes  . Smokeless tobacco: Never Used  . Alcohol Use: No    Review of Systems  All systems reviewed and negative, other than as noted in HPI.   Allergies  Lisinopril  Home Medications   Prior to Admission medications   Medication Sig Start Date End Date Taking? Authorizing Provider  clopidogrel (PLAVIX) 75 MG tablet Take 1 tablet (75 mg total) by mouth daily. 05/02/11   Antonieta Ibaimothy J Gollan, MD  colchicine 0.6 MG tablet Take 1 tablet (0.6 mg total) by mouth 3 (three) times daily as needed. 05/02/11 05/01/12  Antonieta Ibaimothy J  Gollan, MD  nitroGLYCERIN (NITROSTAT) 0.4 MG SL tablet Place 0.4 mg under the tongue every 5 (five) minutes as needed.      Historical Provider, MD  pravastatin (PRAVACHOL) 20 MG tablet Take 1 tablet (20 mg total) by mouth every evening. 05/02/11 05/01/12  Antonieta Ibaimothy J Gollan, MD   BP 139/87 mmHg  Pulse 94  Temp(Src) 98.5 F (36.9 C) (Oral)  Resp 24  Ht 5\' 6"  (1.676 m)  Wt 120 lb (54.432 kg)  BMI 19.38 kg/m2  SpO2 100% Physical Exam  Constitutional: He appears well-developed and well-nourished. No distress.  HENT:  Head: Normocephalic and atraumatic.  Eyes: Conjunctivae are normal. Right eye exhibits no discharge. Left eye exhibits no discharge.  Neck: Neck supple.  Cardiovascular: Regular rhythm and normal heart sounds.  Exam reveals no gallop and no friction rub.   No murmur heard. Mild tachcyardia  Pulmonary/Chest: Effort normal and breath sounds normal. No respiratory distress.  Abdominal: Soft. He exhibits no distension. There is no tenderness.  Musculoskeletal: He exhibits no edema or tenderness.  Right lower extremity is grossly normal in appearance and symmetric as compared to the left. He does have some right calf tenderness. No palpable cords. Circumference R calf roughly the same as compared to the left.   Neurological: He is alert.  Skin: Skin is warm and dry.  Psychiatric: He has a normal mood and affect. His behavior is  normal. Thought content normal.  Nursing note and vitals reviewed.   ED Course  Procedures (including critical care time)  CRITICAL CARE Performed by: Raeford Razor Total critical care time: 35 minutes Critical care time was exclusive of separately billable procedures and treating other patients. Critical care was necessary to treat or prevent imminent or life-threatening deterioration. Critical care was time spent personally by me on the following activities: development of treatment plan with patient and/or surrogate as well as nursing, discussions  with consultants, evaluation of patient's response to treatment, examination of patient, obtaining history from patient or surrogate, ordering and performing treatments and interventions, ordering and review of laboratory studies, ordering and review of radiographic studies, pulse oximetry and re-evaluation of patient's condition.  Labs Review Labs Reviewed - No data to display  Imaging Review No results found.   EKG Interpretation   Date/Time:  Friday September 17 2014 17:37:52 EDT Ventricular Rate:  95 PR Interval:  119 QRS Duration: 81 QT Interval:  345 QTC Calculation: 434 R Axis:   102 Text Interpretation:  Sinus rhythm Borderline short PR interval Confirmed  by Juleen China  MD, Susano Cleckler ([id number]) on 09/17/2014 6:22:34 PM      MDM   Final diagnoses:  Pulmonary embolism    44 year old male with pleuritic right-sided chest pain. Past history of pneumothorax. Chest x-ray today without evidence of pneumothorax. Small right pleural effusion and patchy right-sided infiltrate. Consider pneumonia. Afebrile though and no leukocytosis. Also some concern for possible pulmonary embolism. Patient reports hemoptysis today. Can be seen with pneumonia as well though. He has a resting heart rate around 100. He also reports a two-month history of right foot and calf pain and intermittent swelling in his foot. This raises the question of possible DVT. Will CT to evaluate for possible PE.  His chest pain is atypical for ACS although he does have a known history of coronary artery disease. EKG does not show any acute changes. Will check a troponin.   CT angio does show PE. Infiltrate probably is from ischemic changes. Corresponds to location of PE. No fever or leukocytosis. Abx deferred at this time.    Raeford Razor, MD 09/17/14 [phone]

## 2014-09-18 ENCOUNTER — Encounter (HOSPITAL_COMMUNITY): Payer: Self-pay | Admitting: *Deleted

## 2014-09-18 DIAGNOSIS — I2699 Other pulmonary embolism without acute cor pulmonale: Principal | ICD-10-CM

## 2014-09-18 LAB — COMPREHENSIVE METABOLIC PANEL
ALT: 12 U/L — ABNORMAL LOW (ref 17–63)
AST: 14 U/L — ABNORMAL LOW (ref 15–41)
Albumin: 3.2 g/dL — ABNORMAL LOW (ref 3.5–5.0)
Alkaline Phosphatase: 62 U/L (ref 38–126)
Anion gap: 9 (ref 5–15)
BUN: 16 mg/dL (ref 6–20)
CO2: 27 mmol/L (ref 22–32)
Calcium: 8.1 mg/dL — ABNORMAL LOW (ref 8.9–10.3)
Chloride: 100 mmol/L — ABNORMAL LOW (ref 101–111)
Creatinine, Ser: 0.93 mg/dL (ref 0.61–1.24)
GFR calc Af Amer: >60 mL/min (ref >60)
GFR calc non Af Amer: >60 mL/min (ref >60)
Glucose, Bld: 93 mg/dL (ref 65–99)
Potassium: 4.1 mmol/L (ref 3.5–5.1)
Sodium: 136 mmol/L (ref 135–145)
Total Bilirubin: 0.8 mg/dL (ref 0.3–1.2)
Total Protein: 6.1 g/dL — ABNORMAL LOW (ref 6.5–8.1)

## 2014-09-18 LAB — CBC
HCT: 43.9 % (ref 39.0–52.0)
Hemoglobin: 14.4 g/dL (ref 13.0–17.0)
MCH: 33.1 pg (ref 26.0–34.0)
MCHC: 32.8 g/dL (ref 30.0–36.0)
MCV: 100.9 fL — ABNORMAL HIGH (ref 78.0–100.0)
Platelets: 169 10*3/uL (ref 150–400)
RBC: 4.35 MIL/uL (ref 4.22–5.81)
RDW: 13.5 % (ref 11.5–15.5)
WBC: 8 10*3/uL (ref 4.0–10.5)

## 2014-09-18 LAB — ANTITHROMBIN III: AntiThromb III Func: 91 % (ref 75–120)

## 2014-09-18 MED ORDER — RIVAROXABAN (XARELTO) VTE STARTER PACK (15 & 20 MG): ORAL_TABLET | ORAL | Status: DC

## 2014-09-18 MED ORDER — HYDROMORPHONE HCL 1 MG/ML IJ SOLN
1.0000 mg | INTRAMUSCULAR | Status: DC | PRN
  Administered 2014-09-18 (×3): 1 mg via INTRAVENOUS
  Filled 2014-09-18 (×3): qty 1

## 2014-09-18 MED ORDER — CLOPIDOGREL BISULFATE 75 MG PO TABS
75.0000 mg | ORAL_TABLET | Freq: Every day | ORAL | Status: DC
  Administered 2014-09-18: 75 mg via ORAL
  Filled 2014-09-18: qty 1

## 2014-09-18 MED ORDER — ACETAMINOPHEN 650 MG RE SUPP: 650.0000 mg | Freq: Four times a day (QID) | RECTAL | Status: DC | PRN

## 2014-09-18 MED ORDER — ACETAMINOPHEN 325 MG PO TABS: 650.0000 mg | ORAL_TABLET | Freq: Four times a day (QID) | ORAL | Status: DC | PRN

## 2014-09-18 MED ORDER — HYDROCODONE-ACETAMINOPHEN 5-325 MG PO TABS: 1.0000 | ORAL_TABLET | ORAL | Status: AC | PRN

## 2014-09-18 MED ORDER — ENOXAPARIN SODIUM 60 MG/0.6ML ~~LOC~~ SOLN
1.0000 mg/kg | Freq: Two times a day (BID) | SUBCUTANEOUS | Status: DC
  Administered 2014-09-18: 55 mg via SUBCUTANEOUS
  Filled 2014-09-18 (×2): qty 0.6

## 2014-09-18 MED ORDER — RIVAROXABAN 15 MG PO TABS: 15.0000 mg | ORAL_TABLET | Freq: Two times a day (BID) | ORAL | Status: DC

## 2014-09-18 MED ORDER — SODIUM CHLORIDE 0.9 % IV SOLN
INTRAVENOUS | Status: DC
  Administered 2014-09-18: 01:00:00 via INTRAVENOUS

## 2014-09-18 NOTE — Progress Notes (Signed)
NURSING PROGRESS NOTE  Bernette RedbirdKevin Alcala [id number] Discharge Data: 09/18/2014 1:30 PM Attending Provider: No att. providers found PCP:No PCP Per Patient   Bernette RedbirdKevin Welchel to be D/C'd Home per MD order.    All IV's discontinued and monitored for bleeding.  All belongings returned to patient for patient to take home.  AVS summary and prescriptions reviewed with patient.   Pharmacy came down to discuss Xarelto information with patient.  Patient left floor via wheelchair, escorted by NT.  Last Documented Vital Signs:  Blood pressure 123/66, pulse 102, temperature 99.4 F (37.4 C), temperature source Oral, resp. rate 20, height 5\' 6"  (1.676 m), weight 55.43 kg (122 lb 3.2 oz), SpO2 97 %.  Mertha BaarsHorine, Chrishon Martino D

## 2014-09-18 NOTE — Discharge Summary (Signed)
Physician Discharge Summary  Edward [name] ZOX:[id number] DOB: 03/30/1971 DOA: 09/17/2014  PCP: No PCP Per Patient  Admit date: 09/17/2014 Discharge date: 09/18/2014  Time spent: 45 minutes  Recommendations for Outpatient Follow-up:  -Will be discharged home today. -Will need follow-up with PCP in 2 weeks.   Discharge Diagnoses:  Active Problems:   CAD (coronary artery disease)   Hyperlipidemia   Pulmonary embolism   Discharge Condition: Stable and improved  Filed Weights   09/17/14 1731 09/17/14 2316  Weight: 54.432 kg (120 lb) 55.43 kg (122 lb 3.2 oz)    History of present illness:  Patient is a 44 year old man who comes to the ED with complaints of right-sided chest pain or shortness of breath. Pain is worse with deep inspiration. Started last evening all of a sudden, has had hemoptysis 2 today. Has also had ongoing pain in the right lower leg for the past 3 weeks with intermittent edema and calf pain. CT of the chest shows evidence for PE and he was admitted to the hospitalist service for further evaluation.  Hospital Course:   Pulmonary embolism -Patient is hemodynamically stable and does not have any current oxygen requirements. -Patient was admitted to the hospital and placed on therapeutic doses of Lovenox. -Modalities of anticoagulation discussed with patient and he has elected a novel oral anticoagulate instead of Coumadin. -I discussed with case management who confirms that patient is eligible and can afford Xarelto so this has been the blood thinner of choice. -Patient is agreeable to discharge today has not had further hemoptysis, is not short of breath. -Hypercoagulable panel has been drawn and results are pending at time of discharge.  Procedures:  None   Consultations:  None  Discharge Instructions  Discharge Instructions    Increase activity slowly    Complete by:  As directed             Medication List    STOP taking these medications          clopidogrel 75 MG tablet  Commonly known as:  PLAVIX      TAKE these medications        HYDROcodone-acetaminophen 5-325 MG per tablet  Commonly known as:  NORCO/VICODIN  Take 1 tablet by mouth every 4 (four) hours as needed.     nitroGLYCERIN 0.4 MG SL tablet  Commonly known as:  NITROSTAT  Place 0.4 mg under the tongue every 5 (five) minutes as needed.     Rivaroxaban 15 & 20 MG Tbpk  Commonly known as:  XARELTO STARTER PACK  Take as directed on package: Start with one  tablet by mouth twice a day with food. On Day 22, switch to one  tablet once a day with food.       Allergies  Allergen Reactions  . Lisinopril     "dropped my blood pressure too low"       Follow-up Information    Schedule an appointment as soon as possible for a visit in 2 weeks to follow up.   Why:  With your primary care provider       The results of significant diagnostics from this hospitalization (including imaging, microbiology, ancillary and laboratory) are listed below for reference.    Significant Diagnostic Studies: Dg Chest 2 View  09/17/2014   CLINICAL DATA:  Increasing shortness of breath. Right-sided chest pain. Previous pneumothorax.  EXAM: CHEST  2 VIEW  COMPARISON:  08/30/2005  FINDINGS: There is small right pleural effusion and on  the lateral view there appears to be a small infiltrate posteriorly.  There is peribronchial thickening.  Heart size and pulmonary vascularity are normal. Blebs are present at the lung apices. No pneumothorax. No osseous abnormality. No infiltrates in the left lung.  IMPRESSION: Small right pleural effusion with small patchy infiltrate at the right lung base posteriorly. Bronchitic changes.   Electronically Signed   By: Francene BoyersJames  Maxwell M.D.   On: 09/17/2014 18:14   Ct Angio Chest Pe W/cm &/or Wo Cm  09/17/2014   CLINICAL DATA:  Right upper back pain.  Shortness of breath.  EXAM: CT ANGIOGRAPHY CHEST WITH CONTRAST  TECHNIQUE: Multidetector CT imaging  of the chest was performed using the standard protocol during bolus administration of intravenous contrast. Multiplanar CT image reconstructions and MIPs were obtained to evaluate the vascular anatomy.  CONTRAST:  100mL OMNIPAQUE IOHEXOL 350 MG/ML SOLN  COMPARISON:  Two-view chest x-ray from the same day.  FINDINGS: Pulmonary arterial opacification is satisfactory. Pulmonary emboli are noted within the posterior basilar pulmonary arteries in the right lower lobe. No other significant emboli are present on the right. There are no significant emboli on the left. Heart size is normal. There is no evidence for cardiac strain. No significant mediastinal or axillary adenopathy is present. Limited imaging of the upper abdomen is unremarkable.  Ill-defined airspace disease in the posterior basilar segment of the right lower lobe is likely related to the embolus and ischemia. A small right pleural effusion is noted. A 3 mm nodule is present along the right major fissure. No other focal nodule, mass, or airspace disease is present.  The bone windows demonstrate no focal lytic or blastic lesions.  Review of the MIP images confirms the above findings.  IMPRESSION: 1. Posterior basilar segmental pulmonary emboli. 2. Ill-defined airspace disease within the posterior basilar segment of the right lower lobe likely represents ischemic change. 3. Small right pleural effusion. 4. 3 mm pleural-based nodule along the right major fissure. If the patient is at high risk for bronchogenic carcinoma, follow-up chest CT at 1 year is recommended. If the patient is at low risk, no follow-up is needed. This recommendation follows the consensus statement: Guidelines for Management of Small Pulmonary Nodules Detected on CT Scans: A Statement from the Fleischner Society as published in Radiology 2005; 237:395-400. These results were called by telephone at the time of interpretation on 09/17/2014 at 7:33 pm to Dr. Raeford RazorSTEPHEN KOHUT , who verbally  acknowledged these results.   Electronically Signed   By: Marin Robertshristopher  Mattern M.D.   On: 09/17/2014 19:35    Microbiology: No results found for this or any previous visit (from the past 240 hour(s)).   Labs: Basic Metabolic Panel:  Recent Labs Lab 09/17/14 1835 09/18/14 0557  NA 134* 136  K 3.7 4.1  CL 98* 100*  CO2 26 27  GLUCOSE 101* 93  BUN 20 16  CREATININE 0.93 0.93  CALCIUM 8.6* 8.1*   Liver Function Tests:  Recent Labs Lab 09/18/14 0557  AST 14*  ALT 12*  ALKPHOS 62  BILITOT 0.8  PROT 6.1*  ALBUMIN 3.2*   No results for input(s): LIPASE, AMYLASE in the last 168 hours. No results for input(s): AMMONIA in the last 168 hours. CBC:  Recent Labs Lab 09/17/14 1835 09/18/14 0557  WBC 9.4 8.0  NEUTROABS 6.8  --   HGB 15.2 14.4  HCT 45.5 43.9  MCV 99.8 100.9*  PLT 145* 169   Cardiac Enzymes:  Recent Labs Lab 09/17/14  1835  TROPONINI <0.03   BNP: BNP (last 3 results) No results for input(s): BNP in the last 8760 hours.  ProBNP (last 3 results) No results for input(s): PROBNP in the last 8760 hours.  CBG: No results for input(s): GLUCAP in the last 168 hours.     SignedChaya Jan  [facility] Pager: [phone] 09/18/2014, 4:16 PM

## 2014-09-18 NOTE — Progress Notes (Signed)
Patient c/o pain, doesn't have pain medication ordered at this time. Paged the admitting MD, will follow new orders given and continue to monitor patient.

## 2014-09-18 NOTE — Progress Notes (Signed)
CM communicated with patient regarding his insurance benefits coverage for medication coverage for Xarelto.  Patient has [employer] coverage plan provided by [employer].   Contacted [employer] [phone]  3 mos supply $90.00 1 mos supply will be  $45.00  [facility] [place] stated wanted to try 1 month.  Also, looking needing a primary care physician to follow up with. Also provided patient with Xarelto discount card to take to pharmacy for prescription. Communicated information with doctor regarding prescription and benefits.

## 2014-09-18 NOTE — Discharge Instructions (Signed)
Information on my medicine - XARELTO (rivaroxaban)  This medication education was reviewed with me or my healthcare representative as part of my discharge preparation.  The pharmacist that spoke with me during my hospital stay was:  Mady GemmaHayes, [place] Fan[place] R, [facility]  WHY WAS Carlena HurlXARELTO PRESCRIBED FOR YOU? Xarelto was prescribed to treat blood clots that may have been found in the veins of your legs (deep vein thrombosis) or in your lungs (pulmonary embolism) and to reduce the risk of them occurring again.  What do you need to know about Xarelto? The starting dose is one 15 mg tablet taken TWICE daily with food for the FIRST 21 DAYS then on (enter date)  10/09/14  the dose is changed to one 20 mg tablet taken ONCE A DAY with your evening meal.  DO NOT stop taking Xarelto without talking to the health care provider who prescribed the medication.  Refill your prescription for 20 mg tablets before you run out.  After discharge, you should have regular check-up appointments with your healthcare provider that is prescribing your Xarelto.  In the future your dose may need to be changed if your kidney function changes by a significant amount.  What do you do if you miss a dose? If you are taking Xarelto TWICE DAILY and you miss a dose, take it as soon as you remember. You may take two 15 mg tablets (total 30 mg) at the same time then resume your regularly scheduled 15 mg twice daily the next day.  If you are taking Xarelto ONCE DAILY and you miss a dose, take it as soon as you remember on the same day then continue your regularly scheduled once daily regimen the next day. Do not take two doses of Xarelto at the same time.   Important Safety Information Xarelto is a blood thinner medicine that can cause bleeding. You should call your healthcare provider right away if you experience any of the following: ? Bleeding from an injury or your nose that does not stop. ? Unusual colored urine (red or dark brown) or  unusual colored stools (red or black). ? Unusual bruising for unknown reasons. ? A serious fall or if you hit your head (even if there is no bleeding).  Some medicines may interact with Xarelto and might increase your risk of bleeding while on Xarelto. To help avoid this, consult your healthcare provider or pharmacist prior to using any new prescription or non-prescription medications, including herbals, vitamins, non-steroidal anti-inflammatory drugs (NSAIDs) and supplements.  This website has more information on Xarelto: [email or web address].xarelto.[email or web address].

## 2014-09-19 LAB — BETA-2-GLYCOPROTEIN I ABS, IGG/M/A
Beta-2 Glyco I IgG: <9 GPI IgG units (ref 0–20)
Beta-2-Glycoprotein I IgA: <9 GPI IgA units (ref 0–25)
Beta-2-Glycoprotein I IgM: <9 GPI IgM units (ref 0–32)

## 2014-09-19 LAB — HOMOCYSTEINE: Homocysteine: 7.8 umol/L (ref 0.0–15.0)

## 2014-09-21 LAB — CARDIOLIPIN ANTIBODIES, IGG, IGM, IGA
Anticardiolipin IgA: <9 APL U/mL (ref 0–11)
Anticardiolipin IgG: <9 GPL U/mL (ref 0–14)
Anticardiolipin IgM: <9 MPL U/mL (ref 0–12)

## 2014-09-22 LAB — PROTEIN C, TOTAL: Protein C, Total: 64 % — ABNORMAL LOW (ref 70–140)

## 2014-09-22 LAB — PROTHROMBIN GENE MUTATION

## 2014-09-23 LAB — FACTOR 5 LEIDEN

## 2014-09-24 LAB — PROTEIN S, TOTAL: Protein S Ag, Total: 112 % (ref 58–150)

## 2014-09-24 LAB — LUPUS ANTICOAGULANT PANEL
DRVVT: 59.3 s — ABNORMAL HIGH (ref 0.0–55.1)
PTT Lupus Anticoagulant: 59.3 s — ABNORMAL HIGH (ref 0.0–50.0)

## 2014-09-24 LAB — HEXAGONAL PHASE PHOSPHOLIPID: Hexagonal Phase Phospholipid: 19.7 s — ABNORMAL HIGH (ref 0.0–8.0)

## 2014-09-24 LAB — PROTEIN S ACTIVITY: Protein S Activity: 67 % (ref 60–145)

## 2014-09-24 LAB — PTT-LA INCUB MIX: PTT-LA Incub Mix: 53.9 s — ABNORMAL HIGH (ref 0.0–50.0)

## 2014-09-24 LAB — PTT-LA MIX: PTT-LA Mix: 47.4 s (ref 0.0–50.0)

## 2014-09-24 LAB — PROTEIN C ACTIVITY: Protein C Activity: 92 % (ref 74–151)

## 2014-09-24 LAB — DRVVT CONFIRM: dRVVT Confirm: 1.4 ratio (ref 0.0–1.4)

## 2014-09-24 LAB — DRVVT MIX: dRVVT Mix: 46 s — ABNORMAL HIGH (ref 0.0–45.4)

## 2014-10-25 ENCOUNTER — Other Ambulatory Visit (HOSPITAL_COMMUNITY): Payer: Self-pay | Admitting: Orthopaedic Surgery

## 2014-10-25 DIAGNOSIS — M545 Low back pain, unspecified: Secondary | ICD-10-CM

## 2014-10-27 ENCOUNTER — Telehealth: Payer: Self-pay | Admitting: *Deleted

## 2014-10-27 NOTE — Telephone Encounter (Signed)
lmov for patient for [facility] Nolene BernheimSamatha Jackson PA  So left message for us to schedule him an apt, has seen Dr Mariah MillingGollan in past.

## 2014-11-04 ENCOUNTER — Ambulatory Visit (HOSPITAL_COMMUNITY)
Admission: RE | Admit: 2014-11-04 | Discharge: 2014-11-04 | Disposition: A | Discharge status: Home / Self Care | Payer: PRIVATE HEALTH INSURANCE | Source: Ambulatory Visit | Attending: Orthopaedic Surgery | Admitting: Orthopaedic Surgery

## 2014-11-04 ENCOUNTER — Other Ambulatory Visit (HOSPITAL_COMMUNITY): Payer: Self-pay | Admitting: Orthopaedic Surgery

## 2014-11-04 DIAGNOSIS — M545 Low back pain, unspecified: Secondary | ICD-10-CM

## 2014-11-04 DIAGNOSIS — S0550XA Penetrating wound with foreign body of unspecified eyeball, initial encounter: Secondary | ICD-10-CM

## 2014-11-04 DIAGNOSIS — Z0389 Encounter for observation for other suspected diseases and conditions ruled out: Secondary | ICD-10-CM | POA: Diagnosis not present

## 2015-05-05 ENCOUNTER — Encounter (HOSPITAL_COMMUNITY): Payer: Self-pay | Admitting: Family Medicine

## 2015-05-05 ENCOUNTER — Emergency Department (HOSPITAL_COMMUNITY)
Admission: EM | Admit: 2015-05-05 | Discharge: 2015-05-05 | Disposition: A | Discharge status: Home / Self Care | Payer: PRIVATE HEALTH INSURANCE | Attending: Emergency Medicine | Admitting: Emergency Medicine

## 2015-05-05 ENCOUNTER — Other Ambulatory Visit: Payer: Self-pay

## 2015-05-05 ENCOUNTER — Emergency Department (EMERGENCY_DEPARTMENT_HOSPITAL)
Admit: 2015-05-05 | Discharge: 2015-05-05 | Disposition: A | Discharge status: Home / Self Care | Payer: PRIVATE HEALTH INSURANCE

## 2015-05-05 ENCOUNTER — Emergency Department (EMERGENCY_DEPARTMENT_HOSPITAL)
Admission: EM | Admit: 2015-05-05 | Discharge: 2015-05-05 | Disposition: A | Discharge status: Home / Self Care | Payer: PRIVATE HEALTH INSURANCE | Source: Home / Self Care | Attending: Emergency Medicine | Admitting: Emergency Medicine

## 2015-05-05 DIAGNOSIS — R6889 Other general symptoms and signs: Secondary | ICD-10-CM

## 2015-05-05 DIAGNOSIS — M79609 Pain in unspecified limb: Secondary | ICD-10-CM

## 2015-05-05 DIAGNOSIS — I739 Peripheral vascular disease, unspecified: Secondary | ICD-10-CM | POA: Insufficient documentation

## 2015-05-05 DIAGNOSIS — J439 Emphysema, unspecified: Secondary | ICD-10-CM | POA: Diagnosis not present

## 2015-05-05 DIAGNOSIS — R52 Pain, unspecified: Secondary | ICD-10-CM

## 2015-05-05 DIAGNOSIS — F17210 Nicotine dependence, cigarettes, uncomplicated: Secondary | ICD-10-CM | POA: Insufficient documentation

## 2015-05-05 DIAGNOSIS — R9439 Abnormal result of other cardiovascular function study: Secondary | ICD-10-CM | POA: Insufficient documentation

## 2015-05-05 DIAGNOSIS — M79661 Pain in right lower leg: Secondary | ICD-10-CM | POA: Diagnosis present

## 2015-05-05 LAB — CBC WITH DIFFERENTIAL/PLATELET
Basophils Absolute: 0 10*3/uL (ref 0.0–0.1)
Basophils Relative: 1 %
Eosinophils Absolute: 0.2 10*3/uL (ref 0.0–0.7)
Eosinophils Relative: 3 %
HCT: 44.9 % (ref 39.0–52.0)
Hemoglobin: 15 g/dL (ref 13.0–17.0)
Lymphocytes Relative: 41 %
Lymphs Abs: 2.2 10*3/uL (ref 0.7–4.0)
MCH: 32.7 pg (ref 26.0–34.0)
MCHC: 33.4 g/dL (ref 30.0–36.0)
MCV: 97.8 fL (ref 78.0–100.0)
Monocytes Absolute: 0.4 10*3/uL (ref 0.1–1.0)
Monocytes Relative: 7 %
Neutro Abs: 2.5 10*3/uL (ref 1.7–7.7)
Neutrophils Relative %: 48 %
Platelets: 171 10*3/uL (ref 150–400)
RBC: 4.59 MIL/uL (ref 4.22–5.81)
RDW: 13.2 % (ref 11.5–15.5)
WBC: 5.3 10*3/uL (ref 4.0–10.5)

## 2015-05-05 LAB — BASIC METABOLIC PANEL
Anion gap: 11 (ref 5–15)
BUN: 13 mg/dL (ref 6–20)
CO2: 28 mmol/L (ref 22–32)
Calcium: 9.2 mg/dL (ref 8.9–10.3)
Chloride: 102 mmol/L (ref 101–111)
Creatinine, Ser: 0.92 mg/dL (ref 0.61–1.24)
GFR calc Af Amer: >60 mL/min (ref >60)
GFR calc non Af Amer: >60 mL/min (ref >60)
Glucose, Bld: 91 mg/dL (ref 65–99)
Potassium: 4.4 mmol/L (ref 3.5–5.1)
Sodium: 141 mmol/L (ref 135–145)

## 2015-05-05 LAB — PROTIME-INR
INR: 1.12 (ref 0.00–1.49)
Prothrombin Time: 14.6 seconds (ref 11.6–15.2)

## 2015-05-05 MED ORDER — HYDROCODONE-ACETAMINOPHEN 5-325 MG PO TABS
2.0000 | ORAL_TABLET | ORAL | Status: DC | PRN
Start: 2015-05-05 — End: 2015-05-13

## 2015-05-05 MED ORDER — MORPHINE SULFATE (PF) 4 MG/ML IV SOLN
4.0000 mg | Freq: Once | INTRAVENOUS | Status: AC
  Administered 2015-05-05: 4 mg via INTRAVENOUS
  Filled 2015-05-05: qty 1

## 2015-05-05 NOTE — Progress Notes (Addendum)
VASCULAR LAB PRELIMINARY  PRELIMINARY  PRELIMINARY  PRELIMINARY  Right lower extremity venous duplex completed.    Preliminary report:  There is no DVT or SVT noted in the right lower extremity.  Incidentally, there is collateral flow noted in the distal femoral artery with >50% stenosis.  Severely dampened monophasic waveforms noted in the popliteal, posterior tibial, and anterior tibial arteries.   Austyn Seier, RVT 05/05/2015, 3:50 PM

## 2015-05-05 NOTE — ED Provider Notes (Signed)
CSN: [id number]     Arrival date & time 05/05/15  1435 History   First MD Initiated Contact with Patient 05/05/15 1701     Chief Complaint  Patient presents with  . Leg Pain     (Consider location/radiation/quality/duration/timing/severity/associated sxs/prior Treatment) HPI   58 y Judie Petit w PMH PE last June on xarelto who has been having RLE pain with ambulation since last April which has gradually worsened.  No injuries, no previous dvt, no sig leg swelling  The pain with ambulation comes on after he ambulates approx 200 ft at which point he has to stop and rest, this hasn't changed significantly as of recently.  Past Medical History  Diagnosis Date  . Pneumothorax 2006    s/p chest tube placement x 2   . Tobacco abuse     25 year   . Acute bronchitis   . Emphysema    Past Surgical History  Procedure Laterality Date  . Cardiac catheterization  2012    s/p stent placement  . Chest tube insertion    . Coronary angioplasty with stent placement     History reviewed. No pertinent family history. Social History  Substance Use Topics  . Smoking status: Current Every Day Smoker -- 0.50 packs/day for 25 years    Types: Cigarettes  . Smokeless tobacco: Never Used  . Alcohol Use: No    Review of Systems  Constitutional: Negative for fever and chills.  Eyes: Negative for redness.  Respiratory: Negative for cough and shortness of breath.   Cardiovascular: Negative for chest pain.  Gastrointestinal: Negative for nausea, vomiting, abdominal pain and diarrhea.  Genitourinary: Negative for dysuria.  Musculoskeletal:       RLE pain with ambulation   Skin: Negative for rash.  Neurological: Negative for headaches.  All other systems reviewed and are negative.     Allergies  Lisinopril  Home Medications   Prior to Admission medications   Medication Sig Start Date End Date Taking? Authorizing Provider  HYDROcodone-acetaminophen (NORCO/VICODIN) 5-325 MG tablet Take 2 tablets by  mouth every 4 (four) hours as needed. 05/05/15   Silas Flood, MD  nitroGLYCERIN (NITROSTAT) 0.4 MG SL tablet Place 0.4 mg under the tongue every 5 (five) minutes as needed.      Historical Provider, MD  Rivaroxaban (XARELTO STARTER PACK) 15 & 20 MG TBPK Take as directed on package: Start with one  tablet by mouth twice a day with food. On Day 22, switch to one  tablet once a day with food. 09/18/14   Henderson Cloud, MD   BP 118/84 mmHg  Pulse 72  Temp(Src) 98.7 F (37.1 C) (Oral)  Resp 11  SpO2 95% Physical Exam  Constitutional: He is oriented to person, place, and time. No distress.  HENT:  Head: Normocephalic and atraumatic.  Eyes: EOM are normal. Pupils are equal, round, and reactive to light.  Neck: Normal range of motion. Neck supple.  Cardiovascular: Normal rate.   Pulmonary/Chest: Effort normal. No respiratory distress.  Abdominal: Soft. There is no tenderness.  Musculoskeletal: Normal range of motion.  R foot is warm, unable to palpate R dp pulse but can palpate R femoral pulse.  Motor/sensory function intact in R foot  Neurological: He is alert and oriented to person, place, and time.  Skin: No rash noted. He is not diaphoretic.  Psychiatric: He has a normal mood and affect.    ED Course  Procedures (including critical care time) Labs Review Labs Reviewed  CBC  WITH DIFFERENTIAL/PLATELET  BASIC METABOLIC PANEL  PROTIME-INR    Imaging Review No results found. I have personally reviewed and evaluated these images and lab results as part of my medical decision-making.   EKG Interpretation None      MDM   Final diagnoses:  Claudication (HCC)  Abnormal ankle brachial index    27 y M w PMH PE last June on xarelto who has been having RLE pain with ambulation since last April which has gradually worsened.  No injuries, no previous dvt, no sig leg swelling  Exam as above. Unable to palpate R dp pulse but good femoral pulse and the R foot is warm.   Intact motor/sensory in the R foot.  ABI obtained and 0.39 on the R with dopplerable R dp pulses.  [place] neg for dvt.  I feel that patients sx are 2/2 claudication.l Spoke with vascular attending over the phone who recommended follow up in his office for further evaluation.  Will refer patient to vascular clinic  I have discussed the results, Dx and Tx plan with the patient. They expressed understanding and agree with the plan and were told to return to ED with any worsening of condition or concern.    Disposition: Discharge  Condition: Good  Discharge Medication List as of 05/05/2015  7:14 PM    START taking these medications   Details  HYDROcodone-acetaminophen (NORCO/VICODIN) 5-325 MG tablet Take 2 tablets by mouth every 4 (four) hours as needed., Starting 05/05/2015, Until Discontinued, Print        Follow Up: Chuck Hint, MD [address] [place] [address] [phone]   Please call the above number to schedule a follow up appointment in the next few weeks.  If that number doesn't work, you may call:  [phone]   Pt seen in conjunction with Dr. Marga Hoots, MD 05/06/15 0020  Arby Barrette, MD 05/09/15 1122

## 2015-05-05 NOTE — ED Notes (Signed)
Patient able to dress and ambulate independently 

## 2015-05-05 NOTE — ED Notes (Signed)
Pt here for RLE pain, swelling and burning. sts since April. sts toe nails are discolored. sts he thinks its a DVT.

## 2015-05-05 NOTE — Discharge Instructions (Signed)
Please follow up with vascular surgery within the next few weeks. Intermittent Claudication Intermittent claudication is pain in your leg that occurs when you walk or exercise and goes away when you rest. The pain can occur in one or both legs. CAUSES Intermittent claudication is caused by the buildup of plaque within the major arteries in the body (atherosclerosis). The plaque, which makes arteries stiff and narrow, prevents enough blood from reaching your leg muscles. The pain occurs when you walk or exercise because your muscles need more blood when you are moving and exercising. RISK FACTORS Risk factors include:  A family history of atherosclerosis.  A personal history of stroke or heart disease.  Older age.  Being inactive or overweight.  Smoking cigarettes.  Having another health condition such as:  Diabetes.  High blood pressure.  High cholesterol. SIGNS AND SYMPTOMS  Your hip or leg may:   Ache.  Cramp.  Feel tight.  Feel weak.  Feel heavy. Over time, you may feel pain in your calf, thigh, or hip. DIAGNOSIS  Your health care provider may diagnose intermittent claudication based on your symptoms and medical history. Your health care provider may also do tests to learn more about your condition. These may include:  Blood tests.  An ultrasound.  Imaging tests such as angiography, magnetic resonance angiography (MRA), and computed tomography angiography (CTA). TREATMENT You may be treated for problems such as:  High blood pressure.  High cholesterol.  Diabetes. Other treatments may include:  Lifestyle changes such as:  Starting an exercise program.  Losing weight.  Quitting smoking.  Medicines to help restore blood flow through your legs.  Blood vessel surgery (angioplasty) to restore blood flow if your intermittent claudication is caused by severe peripheral artery disease. HOME CARE INSTRUCTIONS  Manage any other health conditions you  have.  Eat a diet low in saturated fats and calories to maintain a healthy weight.  Quit smoking, if you smoke.  Take medicines only as directed by your health care provider.  If your health care provider recommended an exercise program for you, follow it as directed. Your exercise program may involve:  Walking three or more times a week.  Walking until you have certain symptoms of intermittent claudication.  Resting until symptoms go away.  Gradually increasing walking time to about 50 minutes a day. SEEK MEDICAL CARE IF: Your condition is not getting better or is getting worse. SEEK IMMEDIATE MEDICAL CARE IF:   You have chest pain.  You have difficulty breathing.  You develop arm weakness.  You have trouble speaking.  Your face begins to droop. MAKE SURE YOU:  Understand these instructions.  Will watch your condition.  Will get help if you are not doing well or get worse.   This information is not intended to replace advice given to you by your health care provider. Make sure you discuss any questions you have with your health care provider.   Document Released: 02/03/2004 Document Revised: 04/23/2014 Document Reviewed: 07/09/2013 Elsevier Interactive Patient Education 2016 Elsevier Inc.  Ankle-Brachial Index Test The ankle-brachial index (ABI) test is used to find peripheral vascular disease (PVD). PVD is also known as peripheral arterial disease (PAD). PVD is the blocking or hardening of the arteries anywhere within the circulatory system beyond the heart. PVD is caused by cholesterol deposits in your blood vessels (atherosclerosis). These deposits cause arteries to narrow. The delivery of oxygen to your tissues is impaired as a result. This can cause muscle pain and fatigue.  This is called claudication. PVD means there may also be buildup of cholesterol in your:  Heart. This increases the risk of heart attacks.  Brain. This increases the risk of strokes. The  ankle-brachial index test measures the blood flow in your arms and legs. This test also determines if blood vessels in your leg are narrowed by cholesterol deposits. There are additional causes of a reduced ankle-brachial index, such as inflammation of vessels or a clot in the vessels. However, these are much less common than narrowing due to cholesterol deposits. HOW IS THE ANKLE-BRACHIAL INDEX TEST DONE? The test is done while you are lying down and resting. Measurements are taken of the systolic pressure:  In your arm (brachial).  In your ankle at several points along your leg. Systolic pressure is the pressure inside your arteries when your heart pumps. The measurements are taken several times on both sides. Then, the highest systolic pressure of the ankle is divided by the highest brachial systolic pressure. The result is the ankle-brachial pressure ratio, or ABI. Sometimes this test is repeated after you have exercised on a treadmill for five minutes. You may have leg pain during the exercise portion of the test if you suffer from PAD. If the index number drops after exercise, this may show that PAD is present. A normal ABI ratio is between 0.9 and 1.4. A value below 0.9 is considered abnormal.    This information is not intended to replace advice given to you by your health care provider. Make sure you discuss any questions you have with your health care provider.   Document Released: 04/06/2004 Document Revised: 04/23/2014 Document Reviewed: 11/06/2013 Elsevier Interactive Patient Education [employer]

## 2015-05-05 NOTE — Progress Notes (Signed)
VASCULAR LAB PRELIMINARY  ARTERIAL  ABI completed:Right ABI indicates a SEVERE reduction in arterial blood flow to the right lower extremity at rest, with abnormal waveforms.  Left ABI is WNL.    RIGHT    LEFT    PRESSURE WAVEFORM  PRESSURE WAVEFORM  BRACHIAL 132 T BRACHIAL 131 T  DP 52 Severely Dampened DP 134 T  AT   AT    PT 51 Severely  Dampened PT 136 T  PER   PER    GREAT TOE  NA GREAT TOE  NA    RIGHT LEFT  ABI 0.39 1.03     Rowdy Guerrini, RVT 05/05/2015, 4:18 PM

## 2015-05-06 ENCOUNTER — Telehealth: Payer: Self-pay | Admitting: Vascular Surgery

## 2015-05-06 NOTE — Telephone Encounter (Signed)
-----   Message from Chuck Hint, MD sent at 05/05/2015  7:11 PM EST ----- Regarding: office visit This pt came to ED with claudication. He will call the office to arrange an outpt visit. I did not see him. He can be seen in 2-3 weeks. Thanks CD

## 2015-05-12 ENCOUNTER — Ambulatory Visit (INDEPENDENT_AMBULATORY_CARE_PROVIDER_SITE_OTHER): Payer: PRIVATE HEALTH INSURANCE | Admitting: Vascular Surgery

## 2015-05-12 ENCOUNTER — Encounter: Payer: Self-pay | Admitting: Vascular Surgery

## 2015-05-12 VITALS — BP 134/79 | HR 96 | Temp 97.9°F | Ht 66.0 in | Wt 122.0 lb

## 2015-05-12 DIAGNOSIS — I739 Peripheral vascular disease, unspecified: Secondary | ICD-10-CM

## 2015-05-12 MED ORDER — ENOXAPARIN SODIUM 40 MG/0.4ML ~~LOC~~ SOLN: 80.0000 mg | SUBCUTANEOUS | Status: DC

## 2015-05-12 NOTE — Progress Notes (Signed)
[facility] HISTORY AND PHYSICAL   Referring Physician: Samantha Jackson PA History of Present Illness:  Patient is a 44 y.o. male who presents for evaluation of rest pain right foot with ulceration.   The patient presented to the emergency room several weeks ago complaining of right foot pain. He had ABIs performed at that time which showed an ABI on the right side 0.39. Subsequently he is now developed ulcerations between his fourth and fifth toe on the right foot. He also has claudication symptoms after walking 50-60 feet. He is somewhat limited at work due to his claudication symptoms. He has also had pain at rest in the right foot about 8 months.  Other medical problems include  Coronary artery disease status post stenting several years ago. COPD which has been stable. Patient is a current everyday smoker. Greater than 3 minutes today spent regarding smoking cessation counseling.   He is also on Xarelto from a pulmonary embolus that he had in June 2016.  Past Medical History  Diagnosis Date  . Pneumothorax 2006    s/p chest tube placement x 2   . Tobacco abuse     25 year   . Acute bronchitis   . Emphysema     Past Surgical History  Procedure Laterality Date  . Cardiac catheterization  2012    s/p stent placement  . Chest tube insertion    . Coronary angioplasty with stent placement      Social History Social History  Substance Use Topics  . Smoking status: Current Every Day Smoker -- 0.50 packs/day for 25 years    Types: Cigarettes  . Smokeless tobacco: Never Used  . 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  Allergies  Allergies  Allergen Reactions  . Lisinopril     "dropped my blood pressure too low"     Current Outpatient Prescriptions  Medication Sig Dispense Refill  . Rivaroxaban (XARELTO  STARTER PACK) 15 & 20 MG TBPK Take as directed on package: Start with one 15mg tablet by mouth twice a day with food. On Day 22, switch to one 20mg tablet once a day with food. 51 each 0  . HYDROcodone-acetaminophen (NORCO/VICODIN) 5-325 MG tablet Take 2 tablets by mouth every 4 (four) hours as needed. (Patient not taking: Reported on 05/12/2015) 20 tablet 0  . nitroGLYCERIN (NITROSTAT) 0.4 MG SL tablet Place 0.4 mg under the tongue every 5 (five) minutes as needed. Reported on 05/12/2015     No current facility-administered medications for this visit.    ROS:   General:  No weight loss, Fever, chills  HEENT: No recent headaches, no nasal bleeding, no visual changes, no sore throat  Neurologic: No dizziness, blackouts, seizures. No recent symptoms of stroke or mini- stroke.  He does occasionally get slurred speech. Cardiac:  He gets chest pain at rest or with work approximate 1 time per month  Vascular: + history of rest pain in feet.  + history of claudication.  + history of non-healing ulcer  Pulmonary: No home oxygen, + productive cough, no hemoptysis,  No asthma or wheezing  Musculoskeletal:  [ ] Arthritis, [ ] Low back pain,  [ ] Joint pain  Hematologic:No history of hypercoagulable state.  No history of easy bleeding.  No history of anemia  Gastrointestinal: No   hematochezia or melena,  No gastroesophageal reflux, no trouble swallowing  Urinary:  chronic Kidney disease,  on HD -  MWF or  TTHS,  Burning with urination,  Frequent urination,  Difficulty urinating;   Skin: No rashes  Psychological: No history of anxiety,  No history of depression   Physical Examination  Filed Vitals:   05/12/15 1558  BP: 134/79  Pulse: 96  Temp: 97.9 F (36.6 C)  TempSrc: Oral  Height:  (1.676 m)  Weight: 122 lb (55.339 kg)  SpO2: 98%    Body mass index is 19.7 kg/(m^2).  General:  Alert and oriented, no acute distress HEENT: Normal Neck: No bruit or  JVD Pulmonary: Clear to auscultation bilaterally Cardiac: Regular Rate and Rhythm without murmur Abdomen: Soft, non-tender, non-distended, no mass, no scars Skin: No rash,  Kissing ulcers between toes 4 and 5 right foot Extremity Pulses:  2+ radial, brachial, 2+ bilateral femoral, 2+ left dorsalis pedis, posterior tibial pulses  Absent right popliteal and pedal pulses Musculoskeletal: No deformity or edema  Neurologic: Upper and lower extremity motor 5/5 and symmetric  DATA:   DVT ultrasound was performed at the same time as his ABIs which showed no evidence of DVT in the right or left leg. ABIs 0.39 on the right 1.03 on the left   ASSESSMENT:   Patient with rest pain and nonhealing wound right foot with decreased ABIs at risk for limb loss   PLAN:   Patient will be scheduled for aortogram bilateral extremity runoff possible intervention by my partner Dr. Myra Gianotti on 05/18/2015. Risks benefits possible palpitations and procedure details were discussed with the patient today. Since he is on Xarelto we will stop this on January 30. He will take 1 dose of therapeutic Lovenox on January 31. We will then proceed with his arteriogram on Wednesday, February 1. If the patient has a lesion that is not amenable to percutaneous intervention and requires open operation he will need cardiac risk stratification preoperatively. He probably would benefit from a cardiac evaluation anyway since he has had no follow-up with a cardiologist in several years and still gets chest pain.   the patient does not have a bruit on exam but does describe several episodes of slurred speech that have been witnessed. He probably would benefit from carotid duplex scan to make sure he does not have significant carotid stenosis as well. We will defer this until after his arteriogram.  Fabienne Bruns, MD [facility] Office: [phone] Pager: [phone]

## 2015-05-13 ENCOUNTER — Other Ambulatory Visit: Payer: Self-pay

## 2015-05-17 MED ORDER — SODIUM CHLORIDE 0.9 % IV SOLN
INTRAVENOUS | Status: DC
  Administered 2015-05-18: 07:00:00 via INTRAVENOUS

## 2015-05-18 ENCOUNTER — Ambulatory Visit (HOSPITAL_COMMUNITY)
Admission: RE | Admit: 2015-05-18 | Discharge: 2015-05-18 | Disposition: A | Discharge status: Home / Self Care | Payer: PRIVATE HEALTH INSURANCE | Source: Ambulatory Visit | Attending: Surgery | Admitting: Surgery

## 2015-05-18 ENCOUNTER — Encounter (HOSPITAL_COMMUNITY): Payer: Self-pay | Admitting: Surgery

## 2015-05-18 ENCOUNTER — Encounter (HOSPITAL_COMMUNITY)
Admission: RE | Disposition: A | Discharge status: Home / Self Care | Payer: PRIVATE HEALTH INSURANCE | Source: Ambulatory Visit | Attending: Surgery

## 2015-05-18 DIAGNOSIS — Z7901 Long term (current) use of anticoagulants: Secondary | ICD-10-CM | POA: Insufficient documentation

## 2015-05-18 DIAGNOSIS — Z86711 Personal history of pulmonary embolism: Secondary | ICD-10-CM | POA: Insufficient documentation

## 2015-05-18 DIAGNOSIS — J449 Chronic obstructive pulmonary disease, unspecified: Secondary | ICD-10-CM | POA: Insufficient documentation

## 2015-05-18 DIAGNOSIS — L97519 Non-pressure chronic ulcer of other part of right foot with unspecified severity: Secondary | ICD-10-CM | POA: Diagnosis present

## 2015-05-18 DIAGNOSIS — F17210 Nicotine dependence, cigarettes, uncomplicated: Secondary | ICD-10-CM | POA: Insufficient documentation

## 2015-05-18 DIAGNOSIS — I2510 Atherosclerotic heart disease of native coronary artery without angina pectoris: Secondary | ICD-10-CM | POA: Diagnosis not present

## 2015-05-18 DIAGNOSIS — I70235 Atherosclerosis of native arteries of right leg with ulceration of other part of foot: Secondary | ICD-10-CM | POA: Insufficient documentation

## 2015-05-18 DIAGNOSIS — Z955 Presence of coronary angioplasty implant and graft: Secondary | ICD-10-CM | POA: Insufficient documentation

## 2015-05-18 HISTORY — PX: PERIPHERAL VASCULAR CATHETERIZATION: SHX172C

## 2015-05-18 LAB — POCT I-STAT, CHEM 8
BUN: 19 mg/dL (ref 6–20)
Calcium, Ion: 1.13 mmol/L (ref 1.12–1.23)
Chloride: 103 mmol/L (ref 101–111)
Creatinine, Ser: 1 mg/dL (ref 0.61–1.24)
Glucose, Bld: 83 mg/dL (ref 65–99)
HCT: 48 % (ref 39.0–52.0)
Hemoglobin: 16.3 g/dL (ref 13.0–17.0)
Potassium: 4 mmol/L (ref 3.5–5.1)
Sodium: 140 mmol/L (ref 135–145)
TCO2: 29 mmol/L (ref 0–100)

## 2015-05-18 SURGERY — ABDOMINAL AORTOGRAM
Anesthesia: LOCAL

## 2015-05-18 MED ORDER — PHENOL 1.4 % MT LIQD: 1.0000 | OROMUCOSAL | Status: DC | PRN

## 2015-05-18 MED ORDER — LIDOCAINE HCL (PF) 1 % IJ SOLN
INTRAMUSCULAR | Status: DC | PRN
  Administered 2015-05-18: 09:00:00

## 2015-05-18 MED ORDER — ONDANSETRON HCL 4 MG/2ML IJ SOLN: 4.0000 mg | Freq: Four times a day (QID) | INTRAMUSCULAR | Status: DC | PRN

## 2015-05-18 MED ORDER — FENTANYL CITRATE (PF) 100 MCG/2ML IJ SOLN
INTRAMUSCULAR | Status: DC | PRN
  Administered 2015-05-18: 50 ug via INTRAVENOUS
  Administered 2015-05-18: 25 ug via INTRAVENOUS

## 2015-05-18 MED ORDER — LABETALOL HCL 5 MG/ML IV SOLN: 10.0000 mg | INTRAVENOUS | Status: DC | PRN

## 2015-05-18 MED ORDER — MIDAZOLAM HCL 2 MG/2ML IJ SOLN
INTRAMUSCULAR | Status: DC | PRN
  Administered 2015-05-18 (×2): 1 mg via INTRAVENOUS

## 2015-05-18 MED ORDER — HEPARIN (PORCINE) IN NACL 2-0.9 UNIT/ML-% IJ SOLN
INTRAMUSCULAR | Status: AC
  Filled 2015-05-18: qty 1000

## 2015-05-18 MED ORDER — GUAIFENESIN-DM 100-10 MG/5ML PO SYRP: 15.0000 mL | ORAL_SOLUTION | ORAL | Status: DC | PRN

## 2015-05-18 MED ORDER — IODIXANOL 320 MG/ML IV SOLN
INTRAVENOUS | Status: DC | PRN
  Administered 2015-05-18: 95 mL via INTRA_ARTERIAL

## 2015-05-18 MED ORDER — FENTANYL CITRATE (PF) 100 MCG/2ML IJ SOLN
INTRAMUSCULAR | Status: AC
  Filled 2015-05-18: qty 2

## 2015-05-18 MED ORDER — HYDRALAZINE HCL 20 MG/ML IJ SOLN: 5.0000 mg | INTRAMUSCULAR | Status: DC | PRN

## 2015-05-18 MED ORDER — ALUM & MAG HYDROXIDE-SIMETH 200-200-20 MG/5ML PO SUSP: 15.0000 mL | ORAL | Status: DC | PRN

## 2015-05-18 MED ORDER — MIDAZOLAM HCL 2 MG/2ML IJ SOLN
INTRAMUSCULAR | Status: AC
  Filled 2015-05-18: qty 2

## 2015-05-18 MED ORDER — LIDOCAINE HCL (PF) 1 % IJ SOLN
INTRAMUSCULAR | Status: AC
  Filled 2015-05-18: qty 30

## 2015-05-18 MED ORDER — ACETAMINOPHEN 325 MG RE SUPP: 325.0000 mg | RECTAL | Status: DC | PRN

## 2015-05-18 MED ORDER — ACETAMINOPHEN 325 MG PO TABS: 325.0000 mg | ORAL_TABLET | ORAL | Status: DC | PRN

## 2015-05-18 MED ORDER — MORPHINE SULFATE (PF) 4 MG/ML IV SOLN
INTRAVENOUS | Status: AC
  Filled 2015-05-18: qty 1

## 2015-05-18 MED ORDER — SODIUM CHLORIDE 0.9 % IV SOLN: 1.0000 mL/kg/h | INTRAVENOUS | Status: DC

## 2015-05-18 MED ORDER — MORPHINE SULFATE (PF) 10 MG/ML IV SOLN
2.0000 mg | INTRAVENOUS | Status: DC | PRN
  Administered 2015-05-18: 4 mg via INTRAVENOUS

## 2015-05-18 MED ORDER — OXYCODONE HCL 5 MG PO TABS
ORAL_TABLET | ORAL | Status: AC
  Filled 2015-05-18: qty 2

## 2015-05-18 MED ORDER — OXYCODONE HCL 5 MG PO TABS
5.0000 mg | ORAL_TABLET | ORAL | Status: DC | PRN
  Administered 2015-05-18: 10 mg via ORAL

## 2015-05-18 MED ORDER — DOCUSATE SODIUM 100 MG PO CAPS: 100.0000 mg | ORAL_CAPSULE | Freq: Every day | ORAL | Status: DC

## 2015-05-18 MED ORDER — METOPROLOL TARTRATE 1 MG/ML IV SOLN: 2.0000 mg | INTRAVENOUS | Status: DC | PRN

## 2015-05-18 SURGICAL SUPPLY — 11 items
CATH OMNI FLUSH 5F 65CM (CATHETERS) ×2 IMPLANT
COVER PRB 48X5XTLSCP FOLD TPE (BAG) ×1 IMPLANT
COVER PROBE 5X48 (BAG) ×1
DRAPE ZERO GRAVITY STERILE (DRAPES) ×2 IMPLANT
KIT MICROINTRODUCER STIFF 5F (SHEATH) ×2 IMPLANT
KIT PV (KITS) ×2 IMPLANT
SHEATH PINNACLE 5F 10CM (SHEATH) ×2 IMPLANT
SYR MEDRAD MARK V 150ML (SYRINGE) ×2 IMPLANT
TRANSDUCER W/STOPCOCK (MISCELLANEOUS) ×2 IMPLANT
TRAY PV CATH (CUSTOM PROCEDURE TRAY) ×2 IMPLANT
WIRE BENTSON .035X145CM (WIRE) ×2 IMPLANT

## 2015-05-18 NOTE — Op Note (Addendum)
    Patient name: Edward [name] MRN: [id number] DOB: 1970/10/30 Sex: male  05/18/2015 Pre-operative Diagnosis: Right Leg ulcer Post-operative diagnosis:  Same Surgeon:  Durene Cal Procedure Performed:  1.  Ultrasound-guided access, left femoral artery  2.  Abdominal aortogram  3.  Bilateral lower extremity runoff   4.  Second order catheterization    Indications:  This is a 45 year old gentleman with right foot ulcer.  He is here for angiogram  Procedure:  The patient was identified in the holding area and taken to room 8.  The patient was then placed supine on the table and prepped and draped in the usual sterile fashion.  A time out was called.  Ultrasound was used to evaluate the left common femoral artery.  It was patent .  A digital ultrasound image was acquired.  A micropuncture needle was used to access the left common femoral artery under ultrasound guidance.  An 018 wire was advanced without resistance and a micropuncture sheath was placed.  The 018 wire was removed and a benson wire was placed.  The micropuncture sheath was exchanged for a 5 french sheath.  An omniflush catheter was advanced over the wire to the level of L-1.  An abdominal angiogram was obtained.  Next, using the omniflush catheter and a benson wire, the aortic bifurcation was crossed and the catheter was placed into theright external iliac artery and right runoff was obtained.  left runoff was performed via retrograde sheath injections.  Findings:   Aortogram:  No significant renal artery stenosis.  Luminal irregularity within the mid aorta, possible retraining ulcer.  There is no pressure gradient across this area.  Bilateral common and external iliac arteries are widely patent  Right Lower Extremity:  The right common femoral and profunda femoral artery are widely patent.  The superficial femoral artery is patent however it occludes at the adductor canal.  There is a 10 cm segment occlusion with reconstitution of  the above-knee popliteal artery just proximal to the joint space.  The below knee popliteal artery is patent throughout it's course with 2 vessel runoff via the posterior tibial and peroneal artery  Left Lower Extremity:  The left common femoral profunda femoral and superficial femoral artery are widely patent.  The popliteal artery is widely patent.  There is three-vessel runoff.  Intervention:  None  Impression:  #1  luminal irregularity within the mid abdominal aorta, unable to be fully characterized by catheter-based angiography.  CT scan would be beneficial.  There is no pressure gradient across this lesion  #2  occluded right popliteal artery with reconstitution of the above-knee popliteal artery and two-vessel runoff via the posterior tibial and peroneal artery  #3  no significant left lower extremity stenosis    V. Durene Cal, M.D. [facility] Office: [phone] Pager:  [phone]

## 2015-05-18 NOTE — Discharge Instructions (Signed)

## 2015-05-18 NOTE — Progress Notes (Signed)
Site area: lt groin fa sheath Site Prior to Removal:  Level  0 Pressure Applied For:  25 minutes Manual:   yes Patient Status During Pull:  stable Post Pull Site:  Level  0 Post Pull Instructions Given:  yes Post Pull Pulses Present: yes Dressing Applied:  tegaderm Bedrest begins @  1005 Comments:

## 2015-05-18 NOTE — Interval H&P Note (Signed)
History and Physical Interval Note:  05/18/2015 8:33 AM  Edward [name]  has presented today for surgery, with the diagnosis of pvd with right foot ulcer  The various methods of treatment have been discussed with the patient and family. After consideration of risks, benefits and other options for treatment, the patient has consented to  Procedure(s): Abdominal Aortogram (N/A) as a surgical intervention .  The patient's history has been reviewed, patient examined, no change in status, stable for surgery.  I have reviewed the patient's chart and labs.  Questions were answered to the patient's satisfaction.     Durene Cal

## 2015-05-18 NOTE — H&P (View-Only) (Signed)
[facility] [place] HISTORY AND PHYSICAL   Referring Physician: Terie Purser PA History of Present Illness:  Patient is a 45 y.o. male who presents for evaluation of rest pain right foot with ulceration.   The patient presented to the emergency room several weeks ago complaining of right foot pain. He had ABIs performed at that time which showed an ABI on the right side 0.39. Subsequently he is now developed ulcerations between his fourth and fifth toe on the right foot. He also has claudication symptoms after walking 50-60 feet. He is somewhat limited at work due to his claudication symptoms. He has also had pain at rest in the right foot about 8 months.  Other medical problems include  Coronary artery disease status post stenting several years ago. COPD which has been stable. Patient is a current everyday smoker. Greater than 3 minutes today spent regarding smoking cessation counseling.   He is also on Xarelto from a pulmonary embolus that he had in June 2016.  Past Medical History  Diagnosis Date  . Pneumothorax 2006    s/p chest tube placement x 2   . Tobacco abuse     25 year   . Acute bronchitis   . Emphysema     Past Surgical History  Procedure Laterality Date  . Cardiac catheterization  2012    s/p stent placement  . Chest tube insertion    . Coronary angioplasty with stent placement      Social History Social History  Substance Use Topics  . Smoking status: Current Every Day Smoker -- 0.50 packs/day for 25 years    Types: Cigarettes  . Smokeless tobacco: Never Used  . 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  Allergies  Allergies  Allergen Reactions  . Lisinopril     "dropped my blood pressure too low"     Current Outpatient Prescriptions  Medication Sig Dispense Refill  . Rivaroxaban (XARELTO  STARTER PACK) 15 & 20 MG TBPK Take as directed on package: Start with one  tablet by mouth twice a day with food. On Day 22, switch to one  tablet once a day with food. 51 each 0  . HYDROcodone-acetaminophen (NORCO/VICODIN) 5-325 MG tablet Take 2 tablets by mouth every 4 (four) hours as needed. (Patient not taking: Reported on 05/12/2015) 20 tablet 0  . nitroGLYCERIN (NITROSTAT) 0.4 MG SL tablet Place 0.4 mg under the tongue every 5 (five) minutes as needed. Reported on 05/12/2015     No current facility-administered medications for this visit.    ROS:   General:  No weight loss, Fever, chills  HEENT: No recent headaches, no nasal bleeding, no visual changes, no sore throat  Neurologic: No dizziness, blackouts, seizures. No recent symptoms of stroke or mini- stroke.  He does occasionally get slurred speech. Cardiac:  He gets chest pain at rest or with work approximate 1 time per month  Vascular: + history of rest pain in feet.  + history of claudication.  + history of non-healing ulcer  Pulmonary: No home oxygen, + productive cough, no hemoptysis,  No asthma or wheezing  Musculoskeletal:   Arthritis,  Low back pain,   Joint pain  Hematologic:No history of hypercoagulable state.  No history of easy bleeding.  No history of anemia  Gastrointestinal: No  hematochezia or melena,  No gastroesophageal reflux, no trouble swallowing  Urinary:  chronic Kidney disease,  on HD -  MWF or  TTHS,  Burning with urination,  Frequent urination,  Difficulty urinating;   Skin: No rashes  Psychological: No history of anxiety,  No history of depression   Physical Examination  Filed Vitals:   05/12/15 1558  BP: 134/79  Pulse: 96  Temp: 97.9 F (36.6 C)  TempSrc: Oral  Height:  (1.676 m)  Weight: 122 lb (55.339 kg)  SpO2: 98%    Body mass index is 19.7 kg/(m^2).  General:  Alert and oriented, no acute distress HEENT: Normal Neck: No bruit or  JVD Pulmonary: Clear to auscultation bilaterally Cardiac: Regular Rate and Rhythm without murmur Abdomen: Soft, non-tender, non-distended, no mass, no scars Skin: No rash,  Kissing ulcers between toes 4 and 5 right foot Extremity Pulses:  2+ radial, brachial, 2+ bilateral femoral, 2+ left dorsalis pedis, posterior tibial pulses  Absent right popliteal and pedal pulses Musculoskeletal: No deformity or edema  Neurologic: Upper and lower extremity motor 5/5 and symmetric  DATA:   DVT ultrasound was performed at the same time as his ABIs which showed no evidence of DVT in the right or left leg. ABIs 0.39 on the right 1.03 on the left   ASSESSMENT:   Patient with rest pain and nonhealing wound right foot with decreased ABIs at risk for limb loss   PLAN:   Patient will be scheduled for aortogram bilateral extremity runoff possible intervention by my partner Dr. Myra Gianotti on 05/18/2015. Risks benefits possible palpitations and procedure details were discussed with the patient today. Since he is on Xarelto we will stop this on January 30. He will take 1 dose of therapeutic Lovenox on January 31. We will then proceed with his arteriogram on Wednesday, February 1. If the patient has a lesion that is not amenable to percutaneous intervention and requires open operation he will need cardiac risk stratification preoperatively. He probably would benefit from a cardiac evaluation anyway since he has had no follow-up with a cardiologist in several years and still gets chest pain.   the patient does not have a bruit on exam but does describe several episodes of slurred speech that have been witnessed. He probably would benefit from carotid duplex scan to make sure he does not have significant carotid stenosis as well. We will defer this until after his arteriogram.  Fabienne Bruns, MD [facility] [place] Office: [phone] Pager: [phone]

## 2015-05-19 ENCOUNTER — Other Ambulatory Visit: Payer: Self-pay | Admitting: *Deleted

## 2015-05-19 DIAGNOSIS — Z01810 Encounter for preprocedural cardiovascular examination: Secondary | ICD-10-CM

## 2015-05-19 DIAGNOSIS — I70219 Atherosclerosis of native arteries of extremities with intermittent claudication, unspecified extremity: Secondary | ICD-10-CM

## 2015-05-19 MED FILL — Morphine Sulfate Inj 4 MG/ML: INTRAMUSCULAR | Qty: 1 | Status: AC

## 2015-05-20 ENCOUNTER — Other Ambulatory Visit: Payer: Self-pay | Admitting: *Deleted

## 2015-05-23 ENCOUNTER — Encounter: Payer: Self-pay | Admitting: Cardiovascular Disease

## 2015-05-23 ENCOUNTER — Ambulatory Visit (INDEPENDENT_AMBULATORY_CARE_PROVIDER_SITE_OTHER): Payer: PRIVATE HEALTH INSURANCE | Admitting: Cardiovascular Disease

## 2015-05-23 VITALS — BP 110/80 | HR 75 | Ht 66.0 in | Wt 117.5 lb

## 2015-05-23 DIAGNOSIS — I214 Non-ST elevation (NSTEMI) myocardial infarction: Secondary | ICD-10-CM

## 2015-05-23 DIAGNOSIS — I2510 Atherosclerotic heart disease of native coronary artery without angina pectoris: Secondary | ICD-10-CM | POA: Diagnosis not present

## 2015-05-23 DIAGNOSIS — Z01810 Encounter for preprocedural cardiovascular examination: Secondary | ICD-10-CM

## 2015-05-23 DIAGNOSIS — R079 Chest pain, unspecified: Secondary | ICD-10-CM

## 2015-05-23 DIAGNOSIS — Z720 Tobacco use: Secondary | ICD-10-CM

## 2015-05-23 DIAGNOSIS — F17200 Nicotine dependence, unspecified, uncomplicated: Secondary | ICD-10-CM

## 2015-05-23 NOTE — Patient Instructions (Addendum)
Medication Instructions:  Your physician recommends that you continue on your current medications as directed. Please refer to the Current Medication list given to you today.   Labwork: none  Testing/Procedures: Your physician has requested that you have a lexiscan myoview. For further information please visit [email or web address] Please follow instruction sheet, as given.  [facility] MYOVIEW  Your caregiver has ordered a Stress Test with nuclear imaging. The purpose of this test is to evaluate the blood supply to your heart muscle. This procedure is referred to as a "Non-Invasive Stress Test." This is because other than having an IV started in your vein, nothing is inserted or "invades" your body. Cardiac stress tests are done to find areas of poor blood flow to the heart by determining the extent of coronary artery disease (CAD). Some patients exercise on a treadmill, which naturally increases the blood flow to your heart, while others who are  unable to walk on a treadmill due to physical limitations have a pharmacologic/chemical stress agent called Lexiscan . This medicine will mimic walking on a treadmill by temporarily increasing your coronary blood flow.   Please note: these test may take anywhere between 2-4 hours to complete  PLEASE REPORT TO [facility] MEDICAL MALL ENTRANCE  THE VOLUNTEERS AT THE FIRST DESK WILL DIRECT YOU WHERE TO GO  Date of Procedure:_______Wed, Feb 8_____________________  Arrival Time for Procedure:______7:45am_____________________  Instructions regarding medication:   You may take your morning medications with a sip of water.  PLEASE NOTIFY THE OFFICE AT LEAST 24 HOURS IN ADVANCE IF YOU ARE UNABLE TO KEEP YOUR APPOINTMENT.  [phone] AND  PLEASE NOTIFY NUCLEAR MEDICINE AT [facility] AT LEAST 24 HOURS IN ADVANCE IF YOU ARE UNABLE TO KEEP YOUR APPOINTMENT. [phone]  How to prepare for your Myoview test:  1. Do not eat or drink after midnight 2. No caffeine for 24  hours prior to test 3. No smoking 24 hours prior to test. 4. Your medication may be taken with water.  If your doctor stopped a medication because of this test, do not take that medication. 5. Ladies, please do not wear dresses.  Skirts or pants are appropriate. Please wear a short sleeve shirt. 6. No perfume, cologne or lotion. 7. Wear comfortable walking shoes. No heels!            Follow-Up: Your physician recommends that you schedule a follow-up appointment in: two months at [facility] office   Any Other Special Instructions Will Be Listed Below (If Applicable).     If you need a refill on your cardiac medications before your next appointment, please call your pharmacy.

## 2015-05-25 ENCOUNTER — Encounter: Payer: PRIVATE HEALTH INSURANCE | Admitting: Vascular Surgery

## 2015-05-25 ENCOUNTER — Encounter
Admission: RE | Admit: 2015-05-25 | Discharge: 2015-05-25 | Disposition: A | Discharge status: Home / Self Care | Payer: PRIVATE HEALTH INSURANCE | Source: Ambulatory Visit | Attending: Cardiovascular Disease | Admitting: Cardiovascular Disease

## 2015-05-25 DIAGNOSIS — R079 Chest pain, unspecified: Secondary | ICD-10-CM

## 2015-05-26 ENCOUNTER — Encounter (HOSPITAL_COMMUNITY)
Admission: RE | Admit: 2015-05-26 | Discharge: 2015-05-26 | Disposition: A | Discharge status: Home / Self Care | Payer: PRIVATE HEALTH INSURANCE | Source: Ambulatory Visit | Attending: Vascular Surgery | Admitting: Vascular Surgery

## 2015-05-26 ENCOUNTER — Encounter (HOSPITAL_COMMUNITY): Payer: Self-pay

## 2015-05-26 ENCOUNTER — Other Ambulatory Visit: Payer: Self-pay

## 2015-05-26 ENCOUNTER — Telehealth: Payer: Self-pay

## 2015-05-26 DIAGNOSIS — I739 Peripheral vascular disease, unspecified: Secondary | ICD-10-CM | POA: Diagnosis not present

## 2015-05-26 DIAGNOSIS — Z01810 Encounter for preprocedural cardiovascular examination: Secondary | ICD-10-CM | POA: Insufficient documentation

## 2015-05-26 DIAGNOSIS — Z79899 Other long term (current) drug therapy: Secondary | ICD-10-CM | POA: Insufficient documentation

## 2015-05-26 DIAGNOSIS — Z01818 Encounter for other preprocedural examination: Secondary | ICD-10-CM | POA: Diagnosis present

## 2015-05-26 DIAGNOSIS — Z0183 Encounter for blood typing: Secondary | ICD-10-CM | POA: Diagnosis not present

## 2015-05-26 DIAGNOSIS — Z7902 Long term (current) use of antithrombotics/antiplatelets: Secondary | ICD-10-CM | POA: Diagnosis not present

## 2015-05-26 DIAGNOSIS — Z955 Presence of coronary angioplasty implant and graft: Secondary | ICD-10-CM | POA: Diagnosis not present

## 2015-05-26 DIAGNOSIS — G629 Polyneuropathy, unspecified: Secondary | ICD-10-CM | POA: Diagnosis not present

## 2015-05-26 DIAGNOSIS — I252 Old myocardial infarction: Secondary | ICD-10-CM | POA: Insufficient documentation

## 2015-05-26 DIAGNOSIS — J439 Emphysema, unspecified: Secondary | ICD-10-CM | POA: Insufficient documentation

## 2015-05-26 DIAGNOSIS — Z86718 Personal history of other venous thrombosis and embolism: Secondary | ICD-10-CM | POA: Insufficient documentation

## 2015-05-26 DIAGNOSIS — Z86711 Personal history of pulmonary embolism: Secondary | ICD-10-CM

## 2015-05-26 DIAGNOSIS — I2510 Atherosclerotic heart disease of native coronary artery without angina pectoris: Secondary | ICD-10-CM | POA: Insufficient documentation

## 2015-05-26 DIAGNOSIS — Z01812 Encounter for preprocedural laboratory examination: Secondary | ICD-10-CM | POA: Insufficient documentation

## 2015-05-26 HISTORY — DX: Personal history of other diseases of the musculoskeletal system and connective tissue: Z87.39

## 2015-05-26 HISTORY — DX: Polyneuropathy, unspecified: G62.9

## 2015-05-26 HISTORY — DX: Other pulmonary embolism without acute cor pulmonale: I26.99

## 2015-05-26 HISTORY — DX: Reserved for inherently not codable concepts without codable children: IMO0001

## 2015-05-26 HISTORY — DX: Acute myocardial infarction, unspecified: I21.9

## 2015-05-26 HISTORY — DX: Dizziness and giddiness: R42

## 2015-05-26 HISTORY — DX: Peripheral vascular disease, unspecified: I73.9

## 2015-05-26 HISTORY — DX: Emphysema, unspecified: J43.9

## 2015-05-26 HISTORY — DX: Atherosclerotic heart disease of native coronary artery without angina pectoris: I25.10

## 2015-05-26 HISTORY — DX: Major depressive disorder, single episode, unspecified: F32.9

## 2015-05-26 HISTORY — DX: Depression, unspecified: F32.A

## 2015-05-26 LAB — TYPE AND SCREEN
ABO/RH(D): A POS
Antibody Screen: NEGATIVE

## 2015-05-26 LAB — BASIC METABOLIC PANEL
Anion gap: 9 (ref 5–15)
BUN: 17 mg/dL (ref 6–20)
CO2: 27 mmol/L (ref 22–32)
Calcium: 9.4 mg/dL (ref 8.9–10.3)
Chloride: 102 mmol/L (ref 101–111)
Creatinine, Ser: 0.97 mg/dL (ref 0.61–1.24)
GFR calc Af Amer: >60 mL/min (ref >60)
GFR calc non Af Amer: >60 mL/min (ref >60)
Glucose, Bld: 92 mg/dL (ref 65–99)
Potassium: 4.4 mmol/L (ref 3.5–5.1)
Sodium: 138 mmol/L (ref 135–145)

## 2015-05-26 LAB — APTT: aPTT: 29 seconds (ref 24–37)

## 2015-05-26 LAB — CBC
HCT: 46.4 % (ref 39.0–52.0)
Hemoglobin: 16 g/dL (ref 13.0–17.0)
MCH: 33.2 pg (ref 26.0–34.0)
MCHC: 34.5 g/dL (ref 30.0–36.0)
MCV: 96.3 fL (ref 78.0–100.0)
Platelets: 242 10*3/uL (ref 150–400)
RBC: 4.82 MIL/uL (ref 4.22–5.81)
RDW: 13 % (ref 11.5–15.5)
WBC: 7.7 10*3/uL (ref 4.0–10.5)

## 2015-05-26 LAB — PROTIME-INR
INR: 1.02 (ref 0.00–1.49)
Prothrombin Time: 13.6 seconds (ref 11.6–15.2)

## 2015-05-26 LAB — SURGICAL PCR SCREEN
MRSA, PCR: NEGATIVE
Staphylococcus aureus: NEGATIVE

## 2015-05-26 LAB — ABO/RH: ABO/RH(D): A POS

## 2015-05-26 MED ORDER — CHLORHEXIDINE GLUCONATE CLOTH 2 % EX PADS: 6.0000 | MEDICATED_PAD | Freq: Once | CUTANEOUS | Status: DC

## 2015-05-26 MED ORDER — ENOXAPARIN SODIUM 60 MG/0.6ML ~~LOC~~ SOLN: SUBCUTANEOUS | Status: DC

## 2015-05-26 NOTE — Progress Notes (Addendum)
Dr.Arida at [facility] is cardiologist and last visit in epic from 05-26-15  Heart cath 2012  Echo denies  Stress test has never had one but does have one scheduled for 05/27/15-to be done at [facility]  EKG in epic from 05-05-15  Denies CXR in past yr

## 2015-05-26 NOTE — Assessment & Plan Note (Signed)
The patient needs aggressive treatment of risk factors. He currently lives in [place] and thus I'm going to arrange for his follow-up in our [place] office. He reports no history of hyperlipidemia but given his coronary artery disease and peripheral arterial disease, there is a strong indication for treatment with a statin.

## 2015-05-26 NOTE — Assessment & Plan Note (Signed)
The patient has known history of coronary artery disease with previous bare-metal stent placement to the ramus and moderate mid LAD disease. He has atypical chest pain but does complain of exertional dyspnea which might be related to his tobacco use. I recommend evaluation with a pharmacologic nuclear stress test for risk stratification before surgery. He is not able to exercise on a treadmill due to peripheral arterial disease.

## 2015-05-26 NOTE — Telephone Encounter (Signed)
Rec'd verbal order from Dr. Darrick Penna to have pt. hold Xarelto starting 05/26/15, and to take Lovenox /kg, 1 dose, subcutaneously AM of 05/29/15, prior to surgery on 05/30/15.  Notified pt. Per phone of recommendation.  Verb. understanding of instructions and of how to inject the Lovenox.  Advised will send an order to the pharmacy for Lovenox.  Agreed.

## 2015-05-26 NOTE — Progress Notes (Signed)
Left a message with Kay,Dr.Fields nurse.Pt is having a fem-pop and no order for T&S in epic.Just wanting to verify that they do not want this.Awaiting return call.

## 2015-05-26 NOTE — Progress Notes (Signed)
HPI  This is a 45 year old male who was referred by Dr. Darrick Penna for preoperative cardiovascular evaluation before femoropopliteal bypass for nonhealing ulcers. The patient has known history of coronary artery disease and was most recently seen by Dr.Gollan in January 2013. He had a small myocardial infarction in June 2012. Cardiac catheterization showed 80% stenosis in the ramus artery and 50% stenosis in the mid LAD. He underwent a bare-metal stent placement to the ramus. He has prolonged history of tobacco use but is trying to cut down. He is down to half a pack per day but used to smoke 2 packs per day. He was diagnosed with pulmonary embolism in July 2016. He reports no hyperlipidemia. He has occasional left-sided chest aching at rest which is usually brief. He does complain of significant exertional dyspnea.  Allergies  Allergen Reactions  . Lisinopril     "dropped my blood pressure too low"     Current Outpatient Prescriptions on File Prior to Visit  Medication Sig Dispense Refill  . gabapentin (NEURONTIN) 300 MG capsule Take 600 mg by mouth daily.     . nitroGLYCERIN (NITROSTAT) 0.4 MG SL tablet Place 0.4 mg under the tongue every 5 (five) minutes as needed. Reported on 05/12/2015    . rivaroxaban (XARELTO) 20 MG TABS tablet Take 20 mg by mouth daily with supper.     No current facility-administered medications on file prior to visit.     Past Medical History  Diagnosis Date  . Pneumothorax 2006    s/p chest tube placement x 2   . Emphysema   . DVT (deep venous thrombosis) (HCC)     right leg-takes Xarelto daily bridging with Lovenox for surgery  . Coronary artery disease   . Peripheral vascular disease (HCC)   . Myocardial infarction (HCC) 2012  . Pulmonary embolism (HCC)   . Emphysema lung (HCC)   . Shortness of breath dyspnea     with exertion  . History of bronchitis   . Vertigo     rarely but doesn't take any meds  . Peripheral neuropathy (HCC)     takes  Gabapentin daily  . History of gout     not on any meds  . Depression     not on any meds     Past Surgical History  Procedure Laterality Date  . Chest tube insertion    . Peripheral vascular catheterization N/A 05/18/2015    Procedure: Abdominal Aortogram;  Surgeon: Nada Libman, MD;  Location: [facility] INVASIVE CV LAB;  Service: Cardiovascular;  Laterality: N/A;  . Cardiac catheterization  2012    s/p stent placement  . Coronary angioplasty with stent placement      1 stent  . Hernia repair Right     inguinal     Family History  Problem Relation Age of Onset  . Family history unknown: Yes     Social History   Social History  . Marital Status: Single    Spouse Name: N/A  . Number of Children: N/A  . Years of Education: N/A   Occupational History  . Not on file.   Social History Main Topics  . Smoking status: Current Every Day Smoker -- 0.50 packs/day for 25 years    Types: Cigarettes  . Smokeless tobacco: Never Used  . Alcohol Use: No  . Drug Use: No  . Sexual Activity: Not on file   Other Topics Concern  . Not on file   Social History Narrative  ROS A 10 point review of system was performed. It is negative other than that mentioned in the history of present illness.   PHYSICAL EXAM   BP 110/80 mmHg  Pulse 75  Ht  (1.676 m)  Wt 117 lb 8 oz (53.298 kg)  BMI 18.97 kg/m2 Constitutional: He is oriented to person, place, and time. He appears well-developed and well-nourished. No distress.  HENT: No nasal discharge.  Head: Normocephalic and atraumatic.  Eyes: Pupils are equal and round.  No discharge. Neck: Normal range of motion. Neck supple. No JVD present. No thyromegaly present.  Cardiovascular: Normal rate, regular rhythm, normal heart sounds. Exam reveals no gallop and no friction rub. No murmur heard.  Pulmonary/Chest: Effort normal and breath sounds normal. No stridor. No respiratory distress. He has no wheezes. He has no rales. He  exhibits no tenderness.  Abdominal: Soft. Bowel sounds are normal. He exhibits no distension. There is no tenderness. There is no rebound and no guarding.  Musculoskeletal: Normal range of motion. He exhibits no edema and no tenderness.  Neurological: He is alert and oriented to person, place, and time. Coordination normal.  Skin: Skin is warm and dry. No rash noted. He is not diaphoretic. No erythema. No pallor.  Psychiatric: He has a normal mood and affect. His behavior is normal. Judgment and thought content normal.       DGU:YQIHKV sinus rhythm with no significant ST or T wave changes.   ASSESSMENT AND PLAN

## 2015-05-26 NOTE — Assessment & Plan Note (Signed)
I had a prolonged discussion with him about the detrimental effect of continued smoking especially on his peripheral arterial disease with risk of progression to more advanced degree of ischemia requiring amputation. The patient is trying to quit smoking gradually and he is down to a half a pack per day. I explained to him that he needs to quit completely.

## 2015-05-27 ENCOUNTER — Encounter
Admission: RE | Admit: 2015-05-27 | Discharge: 2015-05-27 | Disposition: A | Discharge status: Home / Self Care | Payer: PRIVATE HEALTH INSURANCE | Source: Ambulatory Visit | Attending: Cardiovascular Disease | Admitting: Cardiovascular Disease

## 2015-05-27 ENCOUNTER — Other Ambulatory Visit (HOSPITAL_COMMUNITY): Payer: Self-pay | Admitting: *Deleted

## 2015-05-27 DIAGNOSIS — R079 Chest pain, unspecified: Secondary | ICD-10-CM | POA: Diagnosis present

## 2015-05-27 LAB — NM MYOCAR MULTI W/SPECT W/WALL MOTION / EF
Estimated workload: 1 METS
Exercise duration (min): 0 min
Exercise duration (sec): 0 s
LV dias vol: 84 mL
LV sys vol: 38 mL
MPHR: 178 {beats}/min
Peak HR: 105 {beats}/min
Percent HR: 58 %
Rest HR: 70 {beats}/min
SDS: 3
SRS: 4
SSS: 6
TID: 0.84

## 2015-05-27 MED ORDER — TECHNETIUM TC 99M SESTAMIBI GENERIC - CARDIOLITE
13.0000 | Freq: Once | INTRAVENOUS | Status: AC | PRN
  Administered 2015-05-27: 13.633 via INTRAVENOUS

## 2015-05-27 MED ORDER — SODIUM CHLORIDE 0.9 % IV SOLN
INTRAVENOUS | Status: DC
  Administered 2015-05-30: 16:00:00 via INTRAVENOUS

## 2015-05-27 MED ORDER — TECHNETIUM TC 99M SESTAMIBI - CARDIOLITE
32.6100 | Freq: Once | INTRAVENOUS | Status: AC | PRN
  Administered 2015-05-27: 32.61 via INTRAVENOUS

## 2015-05-27 MED ORDER — DEXTROSE 5 % IV SOLN
1.5000 g | INTRAVENOUS | Status: AC
  Administered 2015-05-30 (×2): 1.5 g via INTRAVENOUS
  Filled 2015-05-27: qty 1.5

## 2015-05-27 MED ORDER — REGADENOSON 0.4 MG/5ML IV SOLN
0.4000 mg | Freq: Once | INTRAVENOUS | Status: AC
  Administered 2015-05-27: 0.4 mg via INTRAVENOUS

## 2015-05-27 NOTE — Progress Notes (Signed)
Anesthesia chart review: Patient is a 45 year old male scheduled for right femoral to popliteal bypass graft on 05/30/2015 by Dr. Darrick Penna.  History includes smoking, emphysema, PTX s/p chest tube '06, PAD, RLE DVT/PE 09/2014, CAD/MI s/p BMS Ramus '12, exertional dyspnea, peripheral neuropathy, gout, depression, vertigo, right inguinal hernia repair. PCP is listed as Terie Purser, PA-C. He was sent to cardiologist Dr. Kirke Corin for a pre-operative evaluation. Stress test ordered for 05/27/15.  Meds include Neurontin, Nitro, Xarelto (last dose 05/25/15), Lovenox bridge (last dose 05/29/15).   05/05/15 EKG: SR, consider RVH.  05/27/15 Nuclear stress test:  There was no ST segment deviation noted during stress.  Defect 1: There is a small defect of mild severity present in the apical lateral and apex location. Wall motion is normal. This is likely due to artifact.  No T wave inversion was noted during stress.  The study is normal. The study is suboptimal due to extracardiac activity.  This is a low risk study.  The left ventricular ejection fraction is normal (55-65%).  09/29/2010 Cardiac cath [facility]): 50% mid LAD. 80% ramus. 80% proximal ramus. S/P BMS RAMUS with 0% residual.  09/17/14 Chest CTA: IMPRESSION: 1. Posterior basilar segmental pulmonary emboli. 2. Ill-defined airspace disease within the posterior basilar segment of the right lower lobe likely represents ischemic change. 3. Small right pleural effusion. 4. 3 mm pleural-based nodule along the right major fissure. If the patient is at high risk for bronchogenic carcinoma, follow-up chest CT at 1 year is recommended. If the patient is at low risk, no follow-up is needed. This recommendation follows the consensus statement: Guidelines for Management of Small Pulmonary Nodules Detected on CT Scans: A Statement from the Fleischner Society as published in Radiology 2005; 237:395-400.  Preoperative labs noted.   Today's stress test was  low risk with normal EF. Unless otherwise specified by Dr. Kirke Corin, I would anticipate that he could proceed as planned.  Velna Ochs [facility] Short Stay Center/Anesthesiology Phone [phone] 05/27/2015 3:52 PM

## 2015-05-29 NOTE — Anesthesia Preprocedure Evaluation (Addendum)
Anesthesia Evaluation  Patient identified by MRN, date of birth, ID band Patient awake    Reviewed: Allergy & Precautions, NPO status , Patient's Chart, lab work & pertinent test results  History of Anesthesia Complications Negative for: history of anesthetic complications  Airway Mallampati: II  TM Distance: >3 FB Neck ROM: Full    Dental  (+) Teeth Intact, Dental Advisory Given, Poor Dentition, Chipped,    Pulmonary shortness of breath, COPD, Current Smoker,    breath sounds clear to auscultation       Cardiovascular + CAD, + Past MI and + Peripheral Vascular Disease   Rhythm:Regular Rate:Normal     Neuro/Psych  Neuromuscular disease    GI/Hepatic negative GI ROS, Neg liver ROS,   Endo/Other  negative endocrine ROS  Renal/GU negative Renal ROS     Musculoskeletal   Abdominal Normal abdominal exam  (+)   Peds  Hematology negative hematology ROS (+)   Anesthesia Other Findings   Reproductive/Obstetrics                           Anesthesia Physical Anesthesia Plan  ASA: III  Anesthesia Plan: General   Post-op Pain Management:    Induction: Intravenous  Airway Management Planned: Oral ETT  Additional Equipment:   Intra-op Plan:   Post-operative Plan: Extubation in OR  Informed Consent: I have reviewed the patients History and Physical, chart, labs and discussed the procedure including the risks, benefits and alternatives for the proposed anesthesia with the patient or authorized representative who has indicated his/her understanding and acceptance.   Dental advisory given  Plan Discussed with: CRNA and Surgeon  Anesthesia Plan Comments:         Anesthesia Quick Evaluation

## 2015-05-30 ENCOUNTER — Inpatient Hospital Stay (HOSPITAL_COMMUNITY): Payer: PRIVATE HEALTH INSURANCE | Admitting: Vascular Surgery

## 2015-05-30 ENCOUNTER — Encounter (HOSPITAL_COMMUNITY)
Admission: RE | Disposition: A | Discharge status: Home Health | Payer: Self-pay | Source: Ambulatory Visit | Attending: Vascular Surgery

## 2015-05-30 ENCOUNTER — Inpatient Hospital Stay (HOSPITAL_COMMUNITY): Payer: PRIVATE HEALTH INSURANCE | Admitting: Anesthesiology

## 2015-05-30 ENCOUNTER — Inpatient Hospital Stay (HOSPITAL_COMMUNITY): Payer: PRIVATE HEALTH INSURANCE

## 2015-05-30 ENCOUNTER — Inpatient Hospital Stay (HOSPITAL_COMMUNITY)
Admission: RE | Admit: 2015-05-30 | Discharge: 2015-06-03 | DRG: 253 | Disposition: A | Discharge status: Home Health | Payer: PRIVATE HEALTH INSURANCE | Source: Ambulatory Visit | Attending: Vascular Surgery | Admitting: Vascular Surgery

## 2015-05-30 ENCOUNTER — Encounter (HOSPITAL_COMMUNITY): Payer: Self-pay | Admitting: *Deleted

## 2015-05-30 DIAGNOSIS — F17210 Nicotine dependence, cigarettes, uncomplicated: Secondary | ICD-10-CM | POA: Diagnosis present

## 2015-05-30 DIAGNOSIS — J449 Chronic obstructive pulmonary disease, unspecified: Secondary | ICD-10-CM | POA: Diagnosis present

## 2015-05-30 DIAGNOSIS — L97519 Non-pressure chronic ulcer of other part of right foot with unspecified severity: Secondary | ICD-10-CM | POA: Diagnosis present

## 2015-05-30 DIAGNOSIS — Z86711 Personal history of pulmonary embolism: Secondary | ICD-10-CM | POA: Diagnosis not present

## 2015-05-30 DIAGNOSIS — Y832 Surgical operation with anastomosis, bypass or graft as the cause of abnormal reaction of the patient, or of later complication, without mention of misadventure at the time of the procedure: Secondary | ICD-10-CM | POA: Diagnosis not present

## 2015-05-30 DIAGNOSIS — M79671 Pain in right foot: Secondary | ICD-10-CM | POA: Diagnosis present

## 2015-05-30 DIAGNOSIS — I70209 Unspecified atherosclerosis of native arteries of extremities, unspecified extremity: Secondary | ICD-10-CM | POA: Diagnosis present

## 2015-05-30 DIAGNOSIS — T82898A Other specified complication of vascular prosthetic devices, implants and grafts, initial encounter: Secondary | ICD-10-CM | POA: Diagnosis not present

## 2015-05-30 DIAGNOSIS — Z7901 Long term (current) use of anticoagulants: Secondary | ICD-10-CM

## 2015-05-30 DIAGNOSIS — I252 Old myocardial infarction: Secondary | ICD-10-CM | POA: Diagnosis not present

## 2015-05-30 DIAGNOSIS — I70235 Atherosclerosis of native arteries of right leg with ulceration of other part of foot: Principal | ICD-10-CM

## 2015-05-30 DIAGNOSIS — Z955 Presence of coronary angioplasty implant and graft: Secondary | ICD-10-CM

## 2015-05-30 DIAGNOSIS — I70238 Atherosclerosis of native arteries of right leg with ulceration of other part of lower right leg: Secondary | ICD-10-CM

## 2015-05-30 DIAGNOSIS — I70239 Atherosclerosis of native arteries of right leg with ulceration of unspecified site: Secondary | ICD-10-CM

## 2015-05-30 DIAGNOSIS — I70249 Atherosclerosis of native arteries of left leg with ulceration of unspecified site: Secondary | ICD-10-CM

## 2015-05-30 DIAGNOSIS — I2510 Atherosclerotic heart disease of native coronary artery without angina pectoris: Secondary | ICD-10-CM | POA: Diagnosis present

## 2015-05-30 DIAGNOSIS — T82868A Thrombosis of vascular prosthetic devices, implants and grafts, initial encounter: Secondary | ICD-10-CM

## 2015-05-30 HISTORY — PX: INTRAOPERATIVE ARTERIOGRAM: SHX5157

## 2015-05-30 HISTORY — PX: FEMORAL-POPLITEAL BYPASS GRAFT: SHX937

## 2015-05-30 HISTORY — PX: VEIN HARVEST: SHX6363

## 2015-05-30 SURGERY — BYPASS GRAFT FEMORAL-POPLITEAL ARTERY
Anesthesia: General | Site: Leg Upper | Laterality: Right

## 2015-05-30 MED ORDER — ROCURONIUM BROMIDE 50 MG/5ML IV SOLN
INTRAVENOUS | Status: AC
  Filled 2015-05-30: qty 1

## 2015-05-30 MED ORDER — THROMBIN 20000 UNITS EX SOLR
CUTANEOUS | Status: AC
  Filled 2015-05-30: qty 20000

## 2015-05-30 MED ORDER — GUAIFENESIN-DM 100-10 MG/5ML PO SYRP: 15.0000 mL | ORAL_SOLUTION | ORAL | Status: DC | PRN

## 2015-05-30 MED ORDER — ALUM & MAG HYDROXIDE-SIMETH 200-200-20 MG/5ML PO SUSP: 15.0000 mL | ORAL | Status: DC | PRN

## 2015-05-30 MED ORDER — FENTANYL CITRATE (PF) 250 MCG/5ML IJ SOLN
INTRAMUSCULAR | Status: AC
  Filled 2015-05-30: qty 5

## 2015-05-30 MED ORDER — ONDANSETRON HCL 4 MG/2ML IJ SOLN: 4.0000 mg | Freq: Four times a day (QID) | INTRAMUSCULAR | Status: DC | PRN

## 2015-05-30 MED ORDER — PANTOPRAZOLE SODIUM 40 MG PO TBEC
40.0000 mg | DELAYED_RELEASE_TABLET | Freq: Every day | ORAL | Status: DC
  Administered 2015-05-30 – 2015-06-02 (×4): 40 mg via ORAL
  Filled 2015-05-30 (×4): qty 1

## 2015-05-30 MED ORDER — LIDOCAINE HCL (CARDIAC) 20 MG/ML IV SOLN
INTRAVENOUS | Status: AC
  Filled 2015-05-30: qty 5

## 2015-05-30 MED ORDER — ONDANSETRON HCL 4 MG/2ML IJ SOLN
INTRAMUSCULAR | Status: AC
  Filled 2015-05-30: qty 2

## 2015-05-30 MED ORDER — ROCURONIUM BROMIDE 100 MG/10ML IV SOLN
INTRAVENOUS | Status: DC | PRN
  Administered 2015-05-30: 50 mg via INTRAVENOUS

## 2015-05-30 MED ORDER — NEOSTIGMINE METHYLSULFATE 10 MG/10ML IV SOLN
INTRAVENOUS | Status: AC
  Filled 2015-05-30: qty 1

## 2015-05-30 MED ORDER — MIDAZOLAM HCL 2 MG/2ML IJ SOLN: 0.5000 mg | Freq: Once | INTRAMUSCULAR | Status: DC | PRN

## 2015-05-30 MED ORDER — OXYCODONE-ACETAMINOPHEN 5-325 MG PO TABS
ORAL_TABLET | ORAL | Status: AC
Start: 2015-05-30 — End: 2015-05-30
  Administered 2015-05-30: 2 via ORAL
  Filled 2015-05-30: qty 2

## 2015-05-30 MED ORDER — PROPOFOL 10 MG/ML IV BOLUS
INTRAVENOUS | Status: DC | PRN
  Administered 2015-05-30: 150 mg via INTRAVENOUS

## 2015-05-30 MED ORDER — PHENOL 1.4 % MT LIQD: 1.0000 | OROMUCOSAL | Status: DC | PRN

## 2015-05-30 MED ORDER — PROPOFOL 10 MG/ML IV BOLUS
INTRAVENOUS | Status: DC | PRN
  Administered 2015-05-30: 80 mg via INTRAVENOUS
  Administered 2015-05-30: 120 mg via INTRAVENOUS

## 2015-05-30 MED ORDER — MIDAZOLAM HCL 5 MG/5ML IJ SOLN
INTRAMUSCULAR | Status: DC | PRN
  Administered 2015-05-30: 2 mg via INTRAVENOUS

## 2015-05-30 MED ORDER — ONDANSETRON HCL 4 MG/2ML IJ SOLN
INTRAMUSCULAR | Status: DC | PRN
  Administered 2015-05-30: 4 mg via INTRAVENOUS

## 2015-05-30 MED ORDER — LACTATED RINGERS IV SOLN
INTRAVENOUS | Status: DC | PRN
  Administered 2015-05-30: 07:00:00 via INTRAVENOUS

## 2015-05-30 MED ORDER — GLYCOPYRROLATE 0.2 MG/ML IJ SOLN
INTRAMUSCULAR | Status: AC
  Filled 2015-05-30: qty 2

## 2015-05-30 MED ORDER — NITROGLYCERIN 0.4 MG SL SUBL: 0.4000 mg | SUBLINGUAL_TABLET | SUBLINGUAL | Status: DC | PRN

## 2015-05-30 MED ORDER — ACETAMINOPHEN 325 MG RE SUPP
325.0000 mg | RECTAL | Status: DC | PRN
  Filled 2015-05-30: qty 2

## 2015-05-30 MED ORDER — DEXTROSE 5 % IV SOLN
10.0000 mg | INTRAVENOUS | Status: DC | PRN
  Administered 2015-05-30: 30 ug/min via INTRAVENOUS

## 2015-05-30 MED ORDER — GABAPENTIN 300 MG PO CAPS
600.0000 mg | ORAL_CAPSULE | Freq: Every day | ORAL | Status: DC
Start: 2015-05-30 — End: 2015-06-03
  Administered 2015-06-01 – 2015-06-02 (×2): 600 mg via ORAL
  Filled 2015-05-30 (×3): qty 2

## 2015-05-30 MED ORDER — HYDROMORPHONE HCL 1 MG/ML IJ SOLN
INTRAMUSCULAR | Status: AC
  Filled 2015-05-30: qty 1

## 2015-05-30 MED ORDER — FENTANYL CITRATE (PF) 100 MCG/2ML IJ SOLN
INTRAMUSCULAR | Status: DC | PRN
  Administered 2015-05-30: 50 ug via INTRAVENOUS
  Administered 2015-05-30: 100 ug via INTRAVENOUS
  Administered 2015-05-30 (×3): 50 ug via INTRAVENOUS
  Administered 2015-05-30: 100 ug via INTRAVENOUS
  Administered 2015-05-30 (×2): 50 ug via INTRAVENOUS

## 2015-05-30 MED ORDER — ALBUTEROL SULFATE HFA 108 (90 BASE) MCG/ACT IN AERS
INHALATION_SPRAY | RESPIRATORY_TRACT | Status: AC
  Filled 2015-05-30: qty 6.7

## 2015-05-30 MED ORDER — PROMETHAZINE HCL 25 MG/ML IJ SOLN: 6.2500 mg | INTRAMUSCULAR | Status: DC | PRN

## 2015-05-30 MED ORDER — SODIUM CHLORIDE 0.9 % IV SOLN
INTRAVENOUS | Status: DC | PRN
  Administered 2015-05-30: 500 mL

## 2015-05-30 MED ORDER — DEXTROSE 5 % IV SOLN
1.5000 g | Freq: Two times a day (BID) | INTRAVENOUS | Status: AC
  Administered 2015-05-30 – 2015-05-31 (×2): 1.5 g via INTRAVENOUS
  Filled 2015-05-30 (×2): qty 1.5

## 2015-05-30 MED ORDER — IOHEXOL 300 MG/ML  SOLN
INTRAMUSCULAR | Status: DC | PRN
Start: 2015-05-30 — End: 2015-05-30
  Administered 2015-05-30: 35 mL via INTRA_ARTERIAL

## 2015-05-30 MED ORDER — HYDRALAZINE HCL 20 MG/ML IJ SOLN: 5.0000 mg | INTRAMUSCULAR | Status: DC | PRN

## 2015-05-30 MED ORDER — MAGNESIUM SULFATE 2 GM/50ML IV SOLN
2.0000 g | Freq: Every day | INTRAVENOUS | Status: DC | PRN
  Filled 2015-05-30: qty 50

## 2015-05-30 MED ORDER — ACETAMINOPHEN 325 MG PO TABS: 325.0000 mg | ORAL_TABLET | ORAL | Status: DC | PRN

## 2015-05-30 MED ORDER — OXYCODONE-ACETAMINOPHEN 5-325 MG PO TABS
1.0000 | ORAL_TABLET | ORAL | Status: DC | PRN
  Administered 2015-05-30 – 2015-06-03 (×14): 2 via ORAL
  Filled 2015-05-30 (×14): qty 2

## 2015-05-30 MED ORDER — FENTANYL CITRATE (PF) 100 MCG/2ML IJ SOLN
INTRAMUSCULAR | Status: DC | PRN
  Administered 2015-05-30 (×2): 50 ug via INTRAVENOUS

## 2015-05-30 MED ORDER — GLYCOPYRROLATE 0.2 MG/ML IJ SOLN
INTRAMUSCULAR | Status: DC | PRN
  Administered 2015-05-30: 0.3 mg via INTRAVENOUS

## 2015-05-30 MED ORDER — DEXTROSE 5 % IV SOLN
1.5000 g | INTRAVENOUS | Status: DC
  Filled 2015-05-30: qty 1.5

## 2015-05-30 MED ORDER — POTASSIUM CHLORIDE CRYS ER 20 MEQ PO TBCR: 20.0000 meq | EXTENDED_RELEASE_TABLET | Freq: Every day | ORAL | Status: DC | PRN

## 2015-05-30 MED ORDER — LIDOCAINE HCL (CARDIAC) 20 MG/ML IV SOLN
INTRAVENOUS | Status: AC
Start: 2015-05-30 — End: 2015-05-30
  Filled 2015-05-30: qty 5

## 2015-05-30 MED ORDER — HYDROMORPHONE HCL 1 MG/ML IJ SOLN
0.2500 mg | INTRAMUSCULAR | Status: DC | PRN
Start: 2015-05-30 — End: 2015-05-30
  Administered 2015-05-30 (×6): 0.5 mg via INTRAVENOUS

## 2015-05-30 MED ORDER — MORPHINE SULFATE (PF) 2 MG/ML IV SOLN
INTRAVENOUS | Status: AC
  Filled 2015-05-30: qty 1

## 2015-05-30 MED ORDER — DEXAMETHASONE SODIUM PHOSPHATE 10 MG/ML IJ SOLN
INTRAMUSCULAR | Status: AC
  Filled 2015-05-30: qty 1

## 2015-05-30 MED ORDER — MEPERIDINE HCL 25 MG/ML IJ SOLN: 6.2500 mg | INTRAMUSCULAR | Status: DC | PRN

## 2015-05-30 MED ORDER — HYDROMORPHONE HCL 1 MG/ML IJ SOLN: 0.2500 mg | INTRAMUSCULAR | Status: DC | PRN

## 2015-05-30 MED ORDER — CEFAZOLIN SODIUM 1-5 GM-% IV SOLN
INTRAVENOUS | Status: DC | PRN
  Administered 2015-05-30: 2 g via INTRAVENOUS

## 2015-05-30 MED ORDER — DEXAMETHASONE SODIUM PHOSPHATE 10 MG/ML IJ SOLN
INTRAMUSCULAR | Status: DC | PRN
  Administered 2015-05-30: 10 mg via INTRAVENOUS

## 2015-05-30 MED ORDER — LACTATED RINGERS IV SOLN
INTRAVENOUS | Status: DC | PRN
  Administered 2015-05-30 (×2): via INTRAVENOUS

## 2015-05-30 MED ORDER — ARTIFICIAL TEARS OP OINT
TOPICAL_OINTMENT | OPHTHALMIC | Status: AC
  Filled 2015-05-30: qty 3.5

## 2015-05-30 MED ORDER — BISACODYL 10 MG RE SUPP: 10.0000 mg | Freq: Every day | RECTAL | Status: DC | PRN

## 2015-05-30 MED ORDER — DOCUSATE SODIUM 100 MG PO CAPS
100.0000 mg | ORAL_CAPSULE | Freq: Every day | ORAL | Status: DC
  Administered 2015-05-31 – 2015-06-03 (×4): 100 mg via ORAL
  Filled 2015-05-30 (×4): qty 1

## 2015-05-30 MED ORDER — HEPARIN SODIUM (PORCINE) 1000 UNIT/ML IJ SOLN
INTRAMUSCULAR | Status: DC | PRN
  Administered 2015-05-30: 6 mL via INTRAVENOUS

## 2015-05-30 MED ORDER — MORPHINE SULFATE (PF) 2 MG/ML IV SOLN
1.0000 mg | INTRAVENOUS | Status: DC | PRN
  Administered 2015-05-30 – 2015-06-01 (×8): 2 mg via INTRAVENOUS
  Filled 2015-05-30 (×8): qty 1

## 2015-05-30 MED ORDER — NEOSTIGMINE METHYLSULFATE 10 MG/10ML IV SOLN
INTRAVENOUS | Status: DC | PRN
  Administered 2015-05-30: 2 mg via INTRAVENOUS

## 2015-05-30 MED ORDER — HYDROMORPHONE HCL 1 MG/ML IJ SOLN
INTRAMUSCULAR | Status: AC
  Administered 2015-05-30: 0.5 mg via INTRAVENOUS
  Filled 2015-05-30: qty 1

## 2015-05-30 MED ORDER — LIDOCAINE HCL (CARDIAC) 20 MG/ML IV SOLN
INTRAVENOUS | Status: DC | PRN
  Administered 2015-05-30: 100 mg via INTRAVENOUS

## 2015-05-30 MED ORDER — PROPOFOL 10 MG/ML IV BOLUS
INTRAVENOUS | Status: AC
  Filled 2015-05-30: qty 40

## 2015-05-30 MED ORDER — SODIUM CHLORIDE 0.9 % IV SOLN
INTRAVENOUS | Status: DC
  Administered 2015-05-31 (×2): via INTRAVENOUS

## 2015-05-30 MED ORDER — METOPROLOL TARTRATE 1 MG/ML IV SOLN: 2.0000 mg | INTRAVENOUS | Status: DC | PRN

## 2015-05-30 MED ORDER — CEFAZOLIN SODIUM-DEXTROSE 2-3 GM-% IV SOLR
INTRAVENOUS | Status: AC
  Filled 2015-05-30: qty 50

## 2015-05-30 MED ORDER — THROMBIN 20000 UNITS EX SOLR
CUTANEOUS | Status: DC | PRN
  Administered 2015-05-30: 20 mL via TOPICAL

## 2015-05-30 MED ORDER — PHENYLEPHRINE HCL 10 MG/ML IJ SOLN
INTRAMUSCULAR | Status: DC | PRN
  Administered 2015-05-30: 40 ug via INTRAVENOUS
  Administered 2015-05-30: 80 ug via INTRAVENOUS
  Administered 2015-05-30 (×4): 40 ug via INTRAVENOUS

## 2015-05-30 MED ORDER — LABETALOL HCL 5 MG/ML IV SOLN
10.0000 mg | INTRAVENOUS | Status: DC | PRN
Start: 2015-05-30 — End: 2015-06-03

## 2015-05-30 MED ORDER — ALBUTEROL SULFATE HFA 108 (90 BASE) MCG/ACT IN AERS
INHALATION_SPRAY | RESPIRATORY_TRACT | Status: DC | PRN
  Administered 2015-05-30: 5 via RESPIRATORY_TRACT

## 2015-05-30 MED ORDER — HEPARIN SODIUM (PORCINE) 1000 UNIT/ML IJ SOLN
INTRAMUSCULAR | Status: AC
  Filled 2015-05-30: qty 1

## 2015-05-30 MED ORDER — LIDOCAINE HCL (CARDIAC) 20 MG/ML IV SOLN
INTRAVENOUS | Status: DC | PRN
  Administered 2015-05-30: 20 mg via INTRAVENOUS
  Administered 2015-05-30 (×2): 50 mg via INTRAVENOUS

## 2015-05-30 MED ORDER — PHENYLEPHRINE 40 MCG/ML (10ML) SYRINGE FOR IV PUSH (FOR BLOOD PRESSURE SUPPORT)
PREFILLED_SYRINGE | INTRAVENOUS | Status: AC
  Filled 2015-05-30: qty 10

## 2015-05-30 MED ORDER — 0.9 % SODIUM CHLORIDE (POUR BTL) OPTIME
TOPICAL | Status: DC | PRN
  Administered 2015-05-30: 1000 mL

## 2015-05-30 MED ORDER — IOHEXOL 300 MG/ML  SOLN
INTRAMUSCULAR | Status: DC | PRN
  Administered 2015-05-30: 38 mL via INTRAVENOUS

## 2015-05-30 MED ORDER — 0.9 % SODIUM CHLORIDE (POUR BTL) OPTIME
TOPICAL | Status: DC | PRN
  Administered 2015-05-30: 2000 mL

## 2015-05-30 MED ORDER — SUCCINYLCHOLINE CHLORIDE 20 MG/ML IJ SOLN
INTRAMUSCULAR | Status: DC | PRN
  Administered 2015-05-30: 100 mg via INTRAVENOUS

## 2015-05-30 MED ORDER — GLYCOPYRROLATE 0.2 MG/ML IJ SOLN
INTRAMUSCULAR | Status: AC
  Filled 2015-05-30: qty 3

## 2015-05-30 MED ORDER — PROPOFOL 10 MG/ML IV BOLUS
INTRAVENOUS | Status: AC
  Filled 2015-05-30: qty 20

## 2015-05-30 MED ORDER — PHENYLEPHRINE HCL 10 MG/ML IJ SOLN
INTRAMUSCULAR | Status: DC | PRN
  Administered 2015-05-30 (×2): 80 ug via INTRAVENOUS

## 2015-05-30 MED ORDER — MAGNESIUM HYDROXIDE 400 MG/5ML PO SUSP
30.0000 mL | Freq: Every day | ORAL | Status: DC | PRN
  Administered 2015-06-02: 30 mL via ORAL
  Filled 2015-05-30: qty 30

## 2015-05-30 MED ORDER — ARTIFICIAL TEARS OP OINT
TOPICAL_OINTMENT | OPHTHALMIC | Status: DC | PRN
  Administered 2015-05-30: 1 via OPHTHALMIC

## 2015-05-30 MED ORDER — SODIUM CHLORIDE 0.9 % IV SOLN: 500.0000 mL | Freq: Once | INTRAVENOUS | Status: DC | PRN

## 2015-05-30 MED ORDER — MIDAZOLAM HCL 2 MG/2ML IJ SOLN
INTRAMUSCULAR | Status: AC
  Filled 2015-05-30: qty 2

## 2015-05-30 MED ORDER — HEPARIN SODIUM (PORCINE) 1000 UNIT/ML IJ SOLN
INTRAMUSCULAR | Status: DC | PRN
  Administered 2015-05-30 (×2): 6 mL via INTRAVENOUS

## 2015-05-30 SURGICAL SUPPLY — 62 items
BAG ISOLATION DRAPE 18X18 (DRAPES) ×1 IMPLANT
BANDAGE ESMARK 6X9 LF (GAUZE/BANDAGES/DRESSINGS) IMPLANT
BNDG ESMARK 6X9 LF (GAUZE/BANDAGES/DRESSINGS)
CANISTER SUCTION 2500CC (MISCELLANEOUS) ×2 IMPLANT
CANNULA VESSEL 3MM 2 BLNT TIP (CANNULA) ×6 IMPLANT
CATH EMB 3FR 80CM (CATHETERS) ×2 IMPLANT
CLIP TI MEDIUM 24 (CLIP) ×4 IMPLANT
CLIP TI WIDE RED SMALL 24 (CLIP) ×4 IMPLANT
COVER DOME SNAP 22 D (MISCELLANEOUS) ×2 IMPLANT
CUFF TOURNIQUET SINGLE 24IN (TOURNIQUET CUFF) IMPLANT
CUFF TOURNIQUET SINGLE 34IN LL (TOURNIQUET CUFF) IMPLANT
CUFF TOURNIQUET SINGLE 44IN (TOURNIQUET CUFF) IMPLANT
DERMABOND ADVANCED (GAUZE/BANDAGES/DRESSINGS) ×2
DERMABOND ADVANCED .7 DNX12 (GAUZE/BANDAGES/DRESSINGS) ×2 IMPLANT
DRAIN SNY WOU (WOUND CARE) IMPLANT
DRAPE ISOLATION BAG 18X18 (DRAPES) ×1
DRAPE PROXIMA HALF (DRAPES) ×2 IMPLANT
DRAPE X-RAY CASS 24X20 (DRAPES) IMPLANT
ELECT REM PT RETURN 9FT ADLT (ELECTROSURGICAL) ×2
ELECTRODE REM PT RTRN 9FT ADLT (ELECTROSURGICAL) ×1 IMPLANT
EVACUATOR SILICONE 100CC (DRAIN) IMPLANT
GLOVE BIO SURGEON STRL SZ 6.5 (GLOVE) ×4 IMPLANT
GLOVE BIO SURGEON STRL SZ7.5 (GLOVE) ×2 IMPLANT
GLOVE BIOGEL PI IND STRL 6.5 (GLOVE) ×3 IMPLANT
GLOVE BIOGEL PI IND STRL 7.0 (GLOVE) ×1 IMPLANT
GLOVE BIOGEL PI INDICATOR 6.5 (GLOVE) ×3
GLOVE BIOGEL PI INDICATOR 7.0 (GLOVE) ×1
GLOVE ECLIPSE 6.5 STRL STRAW (GLOVE) ×4 IMPLANT
GLOVE SURG SS PI 6.0 STRL IVOR (GLOVE) ×2 IMPLANT
GOWN STRL REUS W/ TWL LRG LVL3 (GOWN DISPOSABLE) ×4 IMPLANT
GOWN STRL REUS W/TWL LRG LVL3 (GOWN DISPOSABLE) ×4
GRAFT PROPATEN THIN WALL 6X80 (Vascular Products) ×2 IMPLANT
KIT BASIN OR (CUSTOM PROCEDURE TRAY) ×2 IMPLANT
KIT ROOM TURNOVER OR (KITS) ×2 IMPLANT
LIQUID BAND (GAUZE/BANDAGES/DRESSINGS) ×2 IMPLANT
NS IRRIG 1000ML POUR BTL (IV SOLUTION) ×4 IMPLANT
PACK PERIPHERAL VASCULAR (CUSTOM PROCEDURE TRAY) ×2 IMPLANT
PAD ARMBOARD 7.5X6 YLW CONV (MISCELLANEOUS) ×4 IMPLANT
PADDING CAST COTTON 6X4 STRL (CAST SUPPLIES) IMPLANT
SET COLLECT BLD 21X3/4 12 (NEEDLE) ×2 IMPLANT
SLEEVE SURGEON STRL (DRAPES) ×2 IMPLANT
SPONGE SURGIFOAM ABS GEL 100 (HEMOSTASIS) IMPLANT
STAPLER VISISTAT 35W (STAPLE) IMPLANT
STOPCOCK 4 WAY LG BORE MALE ST (IV SETS) ×2 IMPLANT
SUT PROLENE 5 0 C 1 24 (SUTURE) ×2 IMPLANT
SUT PROLENE 6 0 CC (SUTURE) ×6 IMPLANT
SUT PROLENE 7 0 BV 1 (SUTURE) ×4 IMPLANT
SUT PROLENE 7 0 BV1 MDA (SUTURE) IMPLANT
SUT SILK 2 0 SH (SUTURE) ×4 IMPLANT
SUT SILK 3 0 (SUTURE)
SUT SILK 3-0 18XBRD TIE 12 (SUTURE) IMPLANT
SUT VIC AB 2-0 SH 27 (SUTURE) ×1
SUT VIC AB 2-0 SH 27XBRD (SUTURE) ×1 IMPLANT
SUT VIC AB 3-0 SH 27 (SUTURE) ×3
SUT VIC AB 3-0 SH 27X BRD (SUTURE) ×3 IMPLANT
SUT VIC AB 4-0 PS2 27 (SUTURE) ×2 IMPLANT
SYR 3ML LL SCALE MARK (SYRINGE) ×2 IMPLANT
TAPE UMBILICAL COTTON 1/8X30 (MISCELLANEOUS) IMPLANT
TRAY FOLEY W/METER SILVER 16FR (SET/KITS/TRAYS/PACK) IMPLANT
TUBING EXTENTION W/L.L. (IV SETS) ×2 IMPLANT
UNDERPAD 30X30 INCONTINENT (UNDERPADS AND DIAPERS) ×2 IMPLANT
WATER STERILE IRR 1000ML POUR (IV SOLUTION) ×2 IMPLANT

## 2015-05-30 SURGICAL SUPPLY — 55 items
BANDAGE ESMARK 6X9 LF (GAUZE/BANDAGES/DRESSINGS) IMPLANT
BNDG ESMARK 6X9 LF (GAUZE/BANDAGES/DRESSINGS)
CANISTER SUCTION 2500CC (MISCELLANEOUS) ×3 IMPLANT
CANNULA VESSEL 3MM 2 BLNT TIP (CANNULA) ×6 IMPLANT
CLIP TI MEDIUM 24 (CLIP) ×3 IMPLANT
CLIP TI WIDE RED SMALL 24 (CLIP) ×3 IMPLANT
CUFF TOURNIQUET SINGLE 24IN (TOURNIQUET CUFF) IMPLANT
CUFF TOURNIQUET SINGLE 34IN LL (TOURNIQUET CUFF) IMPLANT
CUFF TOURNIQUET SINGLE 44IN (TOURNIQUET CUFF) IMPLANT
DRAIN SNY WOU (WOUND CARE) IMPLANT
DRAPE PROXIMA HALF (DRAPES) IMPLANT
DRAPE X-RAY CASS 24X20 (DRAPES) IMPLANT
ELECT REM PT RETURN 9FT ADLT (ELECTROSURGICAL) ×3
ELECTRODE REM PT RTRN 9FT ADLT (ELECTROSURGICAL) ×2 IMPLANT
EVACUATOR SILICONE 100CC (DRAIN) IMPLANT
GLOVE BIO SURGEON STRL SZ 6.5 (GLOVE) ×6 IMPLANT
GLOVE BIO SURGEON STRL SZ7.5 (GLOVE) ×6 IMPLANT
GLOVE BIOGEL PI IND STRL 6.5 (GLOVE) ×6 IMPLANT
GLOVE BIOGEL PI IND STRL 7.0 (GLOVE) ×6 IMPLANT
GLOVE BIOGEL PI INDICATOR 6.5 (GLOVE) ×3
GLOVE BIOGEL PI INDICATOR 7.0 (GLOVE) ×3
GLOVE ECLIPSE 6.5 STRL STRAW (GLOVE) ×6 IMPLANT
GLOVE SURG SS PI 7.0 STRL IVOR (GLOVE) ×6 IMPLANT
GOWN STRL REUS W/ TWL LRG LVL3 (GOWN DISPOSABLE) ×10 IMPLANT
GOWN STRL REUS W/TWL LRG LVL3 (GOWN DISPOSABLE) ×5
GOWN STRL REUS W/TWL XL LVL3 (GOWN DISPOSABLE) ×6 IMPLANT
KIT BASIN OR (CUSTOM PROCEDURE TRAY) ×3 IMPLANT
KIT ROOM TURNOVER OR (KITS) ×3 IMPLANT
LIQUID BAND (GAUZE/BANDAGES/DRESSINGS) ×15 IMPLANT
NS IRRIG 1000ML POUR BTL (IV SOLUTION) ×6 IMPLANT
PACK PERIPHERAL VASCULAR (CUSTOM PROCEDURE TRAY) ×3 IMPLANT
PAD ARMBOARD 7.5X6 YLW CONV (MISCELLANEOUS) ×6 IMPLANT
PADDING CAST COTTON 6X4 STRL (CAST SUPPLIES) IMPLANT
SET COLLECT BLD 21X3/4 12 (NEEDLE) IMPLANT
SET COLLECT BLD 21X3/4 12 PB (MISCELLANEOUS) ×3 IMPLANT
SPONGE SURGIFOAM ABS GEL 100 (HEMOSTASIS) IMPLANT
STAPLER VISISTAT 35W (STAPLE) IMPLANT
STOPCOCK 4 WAY LG BORE MALE ST (IV SETS) ×3 IMPLANT
SUT PROLENE 5 0 C 1 24 (SUTURE) IMPLANT
SUT PROLENE 6 0 CC (SUTURE) ×6 IMPLANT
SUT PROLENE 7 0 BV 1 (SUTURE) ×12 IMPLANT
SUT PROLENE 7 0 BV1 MDA (SUTURE) IMPLANT
SUT SILK 2 0 SH (SUTURE) ×3 IMPLANT
SUT SILK 3 0 (SUTURE) ×2
SUT SILK 3-0 18XBRD TIE 12 (SUTURE) ×4 IMPLANT
SUT VIC AB 2-0 SH 27 (SUTURE) ×1
SUT VIC AB 2-0 SH 27XBRD (SUTURE) ×2 IMPLANT
SUT VIC AB 3-0 SH 27 (SUTURE) ×5
SUT VIC AB 3-0 SH 27X BRD (SUTURE) ×10 IMPLANT
SUT VIC AB 4-0 PS2 27 (SUTURE) ×12 IMPLANT
TAPE UMBILICAL COTTON 1/8X30 (MISCELLANEOUS) ×3 IMPLANT
TRAY FOLEY W/METER SILVER 16FR (SET/KITS/TRAYS/PACK) ×3 IMPLANT
TUBING EXTENTION W/L.L. (IV SETS) ×3 IMPLANT
UNDERPAD 30X30 INCONTINENT (UNDERPADS AND DIAPERS) ×3 IMPLANT
WATER STERILE IRR 1000ML POUR (IV SOLUTION) ×3 IMPLANT

## 2015-05-30 NOTE — Op Note (Signed)
Procedure: Redo right SFA to below knee popliteal bypass with 6 mm Propaten distal vein patch, angiogram  Preop: acute occlusion right fem pop bypass Postop: same  Anesthesia: General Findings: occluded bypass  Asst: Wyvonnia Dusky, Waynetta Sandy PA student  Operative details: After obtaining consent from the patient's family.  He was taken back to the operating room.  After induction of anesthesia and endotracheal intubation, the right lower extremity was prepped and draped in usual fashion.  The below knee incision was reopened down to the level of the popliteal artery.  The graft was occluded. The patient was given 6000 units of heparin. The anastomosis was taken down.  There was some backbleeding from the distal anastomosis and some from the native proximal popliteal.  This was controlled with fine bulldog clamps.  I then passed a #3 Fogarty catheter into the graft but could not get this to pass all the way proximally.  I then opened the proximal thigh incision and attempted to thrombectomize it from the proximal aspect.  This was also unsuccessful.  At this point I assumed there was a problem with the vein and I removed it.  The native SFA was controlled proximally and distally with fine bulldog clamps.  A 6 mm propaten graft was brought on the field and spatulated and sewn end to side to the SFA with a running 6 0 prolene suture.  The graft was clamped at the anastomosis and flow restored to the native SFA. A 3 fogarty was passed down the distal popliteal and no clot was retrieved.  A piece of the old vein was then fashioned into a patch by opening it longitudinally and sewing it as a patch to the below knee popliteal artery using a running 6 0 prolene suture.  The patient was given an additional 6000 units of heparin.  The propaten was then cut to length and spatulated and sewn end of graft to side of patch using a running 6 0 prolene suture.  Everything was forebled backbled and thoroughly  flushed.  The anastomosis was secured and clamps released.  There was a pulse in the PT at the ankle and distal to the anastomosis.  There was monophasic DP and biphasic PT flow.  A completion angio was obtained through the proximal graft with a butterfly needle.  This showed a patent distal anastomosis with dominant PT runoff but with filling of the peroneal and AT.  Hemostasis was obtained.  Both incisions were closed with multiple layers of 3 0 vicryl with a 4 0 vicryl subcuticular in the skin.  The instrument sponge and needle counts were correct.  The patient was taken to PACU stable  Fabienne Bruns, MD [facility] [place] Office: [phone] Pager: [phone]

## 2015-05-30 NOTE — Anesthesia Postprocedure Evaluation (Signed)
Anesthesia Post Note  Patient: Marshel Golubski  Procedure(s) Performed: Procedure(s) (LRB): RIGHT BYPASS GRAFT FEMORAL-POPLITEAL ARTERY USING PROPATEN 75mmx80cm  GRAFT; WITH VEIN CUFF (Right) INTRA OPERATIVE ARTERIOGRAM (Right)  Patient location during evaluation: PACU Anesthesia Type: General Level of consciousness: awake and alert, oriented and patient cooperative Pain management: pain level controlled Vital Signs Assessment: post-procedure vital signs reviewed and stable Respiratory status: spontaneous breathing, nonlabored ventilation and respiratory function stable Cardiovascular status: blood pressure returned to baseline and stable Postop Assessment: no signs of nausea or vomiting Anesthetic complications: no    Last Vitals:  Filed Vitals:   05/30/15 2015 05/30/15 2030  BP: 118/80 108/62  Pulse: 76 71  Temp:    Resp: 13 11    Last Pain:  Filed Vitals:   05/30/15 2038  PainSc: Asleep                 [place] Ramone,E. [place]

## 2015-05-30 NOTE — Progress Notes (Signed)
Bypass looks occluded on duplex.  Probably bad spot in vein jor redundant/kinked as angio looked good at completion.  Family updated.  Fabienne Bruns, MD [facility] [place] Office: [phone] Pager: [phone]

## 2015-05-30 NOTE — Anesthesia Preprocedure Evaluation (Signed)
Anesthesia Evaluation  Patient identified by MRN, date of birth, ID band Patient awake    Reviewed: Allergy & Precautions, NPO status , Patient's Chart, lab work & pertinent test results  History of Anesthesia Complications Negative for: history of anesthetic complications  Airway Mallampati: II  TM Distance: >3 FB Neck ROM: Full    Dental  (+) Teeth Intact, Dental Advisory Given, Poor Dentition, Chipped,    Pulmonary shortness of breath, COPD, Current Smoker,    breath sounds clear to auscultation       Cardiovascular + CAD, + Past MI and + Peripheral Vascular Disease   Rhythm:Regular Rate:Normal     Neuro/Psych  Neuromuscular disease    GI/Hepatic negative GI ROS, Neg liver ROS,   Endo/Other  negative endocrine ROS  Renal/GU negative Renal ROS     Musculoskeletal   Abdominal Normal abdominal exam  (+)   Peds  Hematology negative hematology ROS (+)   Anesthesia Other Findings   Reproductive/Obstetrics                             Anesthesia Physical Anesthesia Plan  ASA: III  Anesthesia Plan: General   Post-op Pain Management:    Induction: Intravenous  Airway Management Planned: Oral ETT  Additional Equipment:   Intra-op Plan:   Post-operative Plan: Extubation in OR  Informed Consent: I have reviewed the patients History and Physical, chart, labs and discussed the procedure including the risks, benefits and alternatives for the proposed anesthesia with the patient or authorized representative who has indicated his/her understanding and acceptance.     Plan Discussed with: CRNA and Surgeon  Anesthesia Plan Comments: (This surgery just done this am , now for redo )        Anesthesia Quick Evaluation

## 2015-05-30 NOTE — Transfer of Care (Signed)
Immediate Anesthesia Transfer of Care Note  Patient: Edward [name]  Procedure(s) Performed: Procedure(s): RIGHT SUPERFICIAL FEMORAL TO BELOW KNEE POPLITEAL ARTERY BYPASS GRAFT (Right) RIGHT GREAT SAPHENOUS VEIN HARVEST (Right) INTRA OPERATIVE ARTERIOGRAM (Right)  Patient Location: PACU  Anesthesia Type:General  Level of Consciousness: awake, oriented, patient cooperative and responds to stimulation  Airway & Oxygen Therapy: Patient Spontanous Breathing and Patient connected to nasal cannula oxygen  Post-op Assessment: Report given to RN and Post -op Vital signs reviewed and stable  Post vital signs: Reviewed and stable  Last Vitals:  Filed Vitals:   05/30/15 0553  BP: 134/90  Pulse: 73  Temp: 36.3 C  Resp: 18    Complications: No apparent anesthesia complications

## 2015-05-30 NOTE — Progress Notes (Signed)
VASCULAR LAB PRELIMINARY  PRELIMINARY  PRELIMINARY  PRELIMINARY  Right GSV fem-pop graft ultrasound completed.    Preliminary report:  Real time imaging with Dr Darrick Penna present.  Probable occlusion of graft at the origin.  Adi Doro, RVT 05/30/2015, 4:05 PM

## 2015-05-30 NOTE — Transfer of Care (Signed)
Immediate Anesthesia Transfer of Care Note  Patient: Edward [name]  Procedure(s) Performed: Procedure(s): RIGHT BYPASS GRAFT FEMORAL-POPLITEAL ARTERY USING PROPATEN 51mmx80cm  GRAFT; WITH VEIN CUFF (Right) INTRA OPERATIVE ARTERIOGRAM (Right)  Patient Location: PACU  Anesthesia Type:General  Level of Consciousness: awake, alert , oriented and patient cooperative  Airway & Oxygen Therapy: Patient Spontanous Breathing and Patient connected to nasal cannula oxygen  Post-op Assessment: Report given to RN, Post -op Vital signs reviewed and stable and Patient moving all extremities  Post vital signs: Reviewed and stable  Last Vitals:  Filed Vitals:   05/30/15 1600 05/30/15 1615  BP: 109/83 116/81  Pulse: 91 61  Temp:    Resp: 17 13    Complications: No apparent anesthesia complications

## 2015-05-30 NOTE — Op Note (Signed)
Procedure: Right superficial femoral artery to below knee popliteal bypass with reversed ipsilateral great saphenous vein, intraop agram  Preoperative diagnosis: Non healing wound right foot  Postoperative diagnosis: Same  Anesthesia: General  Asst.: Karsten Ro, PA-C, Waynetta Sandy PA student  Operative findings:     saphenous vein 3 mm, 3 vessel runoff PT dominant  Operative details: After obtaining informed consent, the patient was taken to the operating room. The patient was placed in supine position on the operating room table. After induction of general anesthesia and endotracheal intubation, a Foley catheter was placed. Next, the patient's entire right lower extremity was prepped and draped in the usual sterile fashion. A longitudinal incision was then made in the right mid thigh and carried down through the subcutaneous tissues to expose the right superficial femoral artery.   The superficial femoral artery was dissected free circumferentially. There was a pulse within the artery.   Next the greater saphenous was identified in the medial portion of the upper thigh incision and this was harvested through several skip incisions on the medial aspect of the leg.  Side branches were ligated and divided between silk ties or clips.  The vein was of good quality 3 mm diameter  The vein harvest incision was deepened into the fascia at the below knee segment and the below knee popliteal space was entered.  The popliteal artery was dissected free circumferentially.  It was soft on palpation.  A tunnel was then created between the heads of the gastrocnemius muscle subsartorial up to the upper thigh.  The vein was ligated distally and at the saphenofemoral junction with a 2 0 silk tie.  The vein was gently distended with heparinized saline and inspected for hemostasis.    The patient was given 6000 units of heparin.  After appropriate circulation time, the superficial femoral artery was controlled with  a small bulldog clamp proximally and distally.   A longitudinal opening was made in the superficial femoral artery on its anterior surface.  The vessel was soft. The vein was placed in a reversed configuration.  The arteriotomy was extended with Pott's scissors.  The vein was spatulated and sewn end to side to the artery using a running 6 0 Prolene.  Just prior to completion anastomosis everything was forebled backbled and thoroughly flushed. Proximal clamp and distal clamps were removed and there was good pulsatile flow in the graft immediately.   The graft was then brought through the subsartorial tunnel down to the below-knee popliteal artery after marking for orientation. The below-knee popliteal artery was controlled proximally and distally with small bulldog clamps.  There was some spasm. A longitudinal opening was made in the distal below-knee popliteal artery in an area that was fairly free of calcification. The graft was then cut to length and spatulated and sewn end of graft to side of artery using running 6-0 Prolene suture.  At completion of the anastomosis everything was forebled backbled and thoroughly flushed. The remainder of the anastomosis was completed and all clamps were removed restoring pulsatile flow to the below-knee popliteal artery. An intraoperative arteriogram was obtained with a butterfly in the proximal vein graft.  These images are stored in PACS as the case was done in room 16.  The patient had biphasic Doppler flow in the posterior tibial and monophasic flow in the dorsalis pedis areas of the foot. He also had a palpable PT pulse.  One repair stitch was placed in the lateral wall of the proximal anastomosis and  at the medial heel of the distal anastomosis.    After hemostasis was obtained, the deep layers and subcutaneous layers of the below-knee popliteal incision were closed with running 3-0 Vicryl suture. The skin was closed with a 4 0 vicryl subcuticular stitch.   The  saphenectomy incisions were closed with running 3 0 vicryl follow by 4 0 vicryl.  The high thigh incision was inspected and found to be hemostatic. This was then closed in multiple layers of running 3-0 Vicryl suture and 4-0 subcuticular stitch. The patient tolerated the procedure well and there were no complications. Instrument sponge and needle counts correct in the case. Patient was taken to the recovery in stable condition.  Fabienne Bruns, MD [facility] [place] Office: [phone] Pager: [phone]

## 2015-05-30 NOTE — Anesthesia Postprocedure Evaluation (Signed)
Anesthesia Post Note  Patient: Edward [name]  Procedure(s) Performed: Procedure(s) (LRB): RIGHT SUPERFICIAL FEMORAL TO BELOW KNEE POPLITEAL ARTERY BYPASS GRAFT (Right) RIGHT GREAT SAPHENOUS VEIN HARVEST (Right) INTRA OPERATIVE ARTERIOGRAM (Right)  Patient location during evaluation: PACU Anesthesia Type: General Level of consciousness: awake and alert Pain management: pain level controlled Vital Signs Assessment: post-procedure vital signs reviewed and stable Respiratory status: spontaneous breathing, nonlabored ventilation, respiratory function stable and patient connected to nasal cannula oxygen Cardiovascular status: blood pressure returned to baseline and stable Postop Assessment: no signs of nausea or vomiting Anesthetic complications: no    Last Vitals:  Filed Vitals:   05/30/15 1400 05/30/15 1415  BP: 111/69 106/67  Pulse: 87 97  Temp:    Resp: 12 15    Last Pain:  Filed Vitals:   05/30/15 1424  PainSc: Asleep                 Rickita Forstner,JAMES TERRILL

## 2015-05-30 NOTE — Anesthesia Procedure Notes (Signed)
Procedure Name: Intubation Performed by: Everlene Balls T Pre-anesthesia Checklist: Patient identified, Emergency Drugs available, Suction available, Patient being monitored and Timeout performed Patient Re-evaluated:Patient Re-evaluated prior to inductionOxygen Delivery Method: Circle system utilized Intubation Type: IV induction Ventilation: Mask ventilation without difficulty Laryngoscope Size: 4 and Mac Grade View: Grade I Tube type: Oral Tube size: 7.5 mm Number of attempts: 1 Airway Equipment and Method: Stylet Placement Confirmation: ETT inserted through vocal cords under direct vision,  positive ETCO2,  CO2 detector and breath sounds checked- equal and bilateral Secured at: 23 cm Tube secured with: Tape Dental Injury: Teeth and Oropharynx as per pre-operative assessment

## 2015-05-30 NOTE — H&P (View-Only) (Signed)
[facility] [place] HISTORY AND PHYSICAL   Referring Physician: Samantha Jackson PA History of Present Illness:  Patient is a 45 y.o. male who presents for evaluation of rest pain right foot with ulceration.   The patient presented to the emergency room several weeks ago complaining of right foot pain. He had ABIs performed at that time which showed an ABI on the right side 0.39. Subsequently he is now developed ulcerations between his fourth and fifth toe on the right foot. He also has claudication symptoms after walking 50-60 feet. He is somewhat limited at work due to his claudication symptoms. He has also had pain at rest in the right foot about 8 months.  Other medical problems include  Coronary artery disease status post stenting several years ago. COPD which has been stable. Patient is a current everyday smoker. Greater than 3 minutes today spent regarding smoking cessation counseling.   He is also on Xarelto from a pulmonary embolus that he had in June 2016.  Past Medical History  Diagnosis Date  . Pneumothorax 2006    s/p chest tube placement x 2   . Tobacco abuse     25 year   . Acute bronchitis   . Emphysema     Past Surgical History  Procedure Laterality Date  . Cardiac catheterization  2012    s/p stent placement  . Chest tube insertion    . Coronary angioplasty with stent placement      Social History Social History  Substance Use Topics  . Smoking status: Current Every Day Smoker -- 0.50 packs/day for 25 years    Types: Cigarettes  . Smokeless tobacco: Never Used  . 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  Allergies  Allergies  Allergen Reactions  . Lisinopril     "dropped my blood pressure too low"     Current Outpatient Prescriptions  Medication Sig Dispense Refill  . Rivaroxaban (XARELTO  STARTER PACK) 15 & 20 MG TBPK Take as directed on package: Start with one 15mg tablet by mouth twice a day with food. On Day 22, switch to one 20mg tablet once a day with food. 51 each 0  . HYDROcodone-acetaminophen (NORCO/VICODIN) 5-325 MG tablet Take 2 tablets by mouth every 4 (four) hours as needed. (Patient not taking: Reported on 05/12/2015) 20 tablet 0  . nitroGLYCERIN (NITROSTAT) 0.4 MG SL tablet Place 0.4 mg under the tongue every 5 (five) minutes as needed. Reported on 05/12/2015     No current facility-administered medications for this visit.    ROS:   General:  No weight loss, Fever, chills  HEENT: No recent headaches, no nasal bleeding, no visual changes, no sore throat  Neurologic: No dizziness, blackouts, seizures. No recent symptoms of stroke or mini- stroke.  He does occasionally get slurred speech. Cardiac:  He gets chest pain at rest or with work approximate 1 time per month  Vascular: + history of rest pain in feet.  + history of claudication.  + history of non-healing ulcer  Pulmonary: No home oxygen, + productive cough, no hemoptysis,  No asthma or wheezing  Musculoskeletal:  [ ] Arthritis, [ ] Low back pain,  [ ] Joint pain  Hematologic:No history of hypercoagulable state.  No history of easy bleeding.  No history of anemia  Gastrointestinal: No   hematochezia or melena,  No gastroesophageal reflux, no trouble swallowing  Urinary:  chronic Kidney disease,  on HD -  MWF or  TTHS,  Burning with urination,  Frequent urination,  Difficulty urinating;   Skin: No rashes  Psychological: No history of anxiety,  No history of depression   Physical Examination  Filed Vitals:   05/12/15 1558  BP: 134/79  Pulse: 96  Temp: 97.9 F (36.6 C)  TempSrc: Oral  Height:  (1.676 m)  Weight: 122 lb (55.339 kg)  SpO2: 98%    Body mass index is 19.7 kg/(m^2).  General:  Alert and oriented, no acute distress HEENT: Normal Neck: No bruit or  JVD Pulmonary: Clear to auscultation bilaterally Cardiac: Regular Rate and Rhythm without murmur Abdomen: Soft, non-tender, non-distended, no mass, no scars Skin: No rash,  Kissing ulcers between toes 4 and 5 right foot Extremity Pulses:  2+ radial, brachial, 2+ bilateral femoral, 2+ left dorsalis pedis, posterior tibial pulses  Absent right popliteal and pedal pulses Musculoskeletal: No deformity or edema  Neurologic: Upper and lower extremity motor 5/5 and symmetric  DATA:   DVT ultrasound was performed at the same time as his ABIs which showed no evidence of DVT in the right or left leg. ABIs 0.39 on the right 1.03 on the left   ASSESSMENT:   Patient with rest pain and nonhealing wound right foot with decreased ABIs at risk for limb loss   PLAN:   Patient will be scheduled for aortogram bilateral extremity runoff possible intervention by my partner Dr. Myra Gianotti on 05/18/2015. Risks benefits possible palpitations and procedure details were discussed with the patient today. Since he is on Xarelto we will stop this on January 30. He will take 1 dose of therapeutic Lovenox on January 31. We will then proceed with his arteriogram on Wednesday, February 1. If the patient has a lesion that is not amenable to percutaneous intervention and requires open operation he will need cardiac risk stratification preoperatively. He probably would benefit from a cardiac evaluation anyway since he has had no follow-up with a cardiologist in several years and still gets chest pain.   the patient does not have a bruit on exam but does describe several episodes of slurred speech that have been witnessed. He probably would benefit from carotid duplex scan to make sure he does not have significant carotid stenosis as well. We will defer this until after his arteriogram.  Fabienne Bruns, MD [facility] [place] Office: [phone] Pager: [phone]

## 2015-05-30 NOTE — Progress Notes (Addendum)
  [facility] Day of Surgery Note  Subjective:  Patient seen in PACU. Says his foot feels "different" from earlier. Has been really sleepy.   Filed Vitals:   05/30/15 1400 05/30/15 1415  BP: 111/69 106/67  Pulse: 87 97  Temp:    Resp: 12 15    Incisions:  Right leg incisions clean and intact. No hematoma.  Extremities:  Nonpalpable right pedal pulses. Brisk monophasic right PT doppler flow. Very faint right DP doppler signal.    Assessment/Plan:  This is a 45 y.o. male who is s/p right superificial femoral to below knee popliteal bypass with right great saphenous vein.   Right PT previously easily palpable in OR, now nonpalpable. Will get arterial duplex to evaluate bypass patency.    Maris Berger, [place] Pager: [phone] 05/30/2015 2:31 PM   Agree with above.  Has biphasic PT doppler flow and monophasic DP.  This is different from intraop.  Angio intraop showed no narrowing.  Fabienne Bruns, MD [facility] [place] Office: [phone] Pager: [phone]

## 2015-05-30 NOTE — Interval H&P Note (Signed)
History and Physical Interval Note:  05/30/2015 7:20 AM  Edward [name]  has presented today for surgery, with the diagnosis of peripheral vascular disease with claudication  The various methods of treatment have been discussed with the patient and family. After consideration of risks, benefits and other options for treatment, the patient has consented to  Procedure(s): BYPASS GRAFT SUPERFICIAL FEMORAL- BELOW KNEE POPLITEAL ARTERY (Right) as a surgical intervention .  The patient's history has been reviewed, patient examined, no change in status, stable for surgery.  I have reviewed the patient's chart and labs.  Questions were answered to the patient's satisfaction.     Fabienne Bruns

## 2015-05-30 NOTE — Progress Notes (Signed)
Edward [name]. In PACU @ 1420 to assess patient. Pulses diminished from immediate O.R. Assessment at end of case. Ultrasound ordered. Family in PACU @ 1555 after being updated by Dr. Darrick Penna. Consent signed by sister to take pt back to O.R.

## 2015-05-30 NOTE — Anesthesia Procedure Notes (Signed)
Procedure Name: Intubation Date/Time: 05/30/2015 4:27 PM Performed by: Roney Mans P Pre-anesthesia Checklist: Patient identified, Emergency Drugs available, Suction available, Patient being monitored and Timeout performed Patient Re-evaluated:Patient Re-evaluated prior to inductionOxygen Delivery Method: Circle system utilized Preoxygenation: Pre-oxygenation with 100% oxygen Intubation Type: IV induction, Cricoid Pressure applied and Rapid sequence Laryngoscope Size: Mac and 4 Grade View: Grade I Tube type: Oral Tube size: 7.5 mm Number of attempts: 1 Airway Equipment and Method: Stylet Placement Confirmation: ETT inserted through vocal cords under direct vision,  positive ETCO2 and breath sounds checked- equal and bilateral Secured at: 21 cm Tube secured with: Tape Dental Injury: Teeth and Oropharynx as per pre-operative assessment

## 2015-05-31 ENCOUNTER — Encounter (HOSPITAL_COMMUNITY): Payer: Self-pay

## 2015-05-31 ENCOUNTER — Ambulatory Visit: Payer: PRIVATE HEALTH INSURANCE | Admitting: Cardiovascular Disease

## 2015-05-31 ENCOUNTER — Ambulatory Visit (HOSPITAL_COMMUNITY): Payer: PRIVATE HEALTH INSURANCE

## 2015-05-31 DIAGNOSIS — I70235 Atherosclerosis of native arteries of right leg with ulceration of other part of foot: Secondary | ICD-10-CM

## 2015-05-31 LAB — BASIC METABOLIC PANEL
Anion gap: 10 (ref 5–15)
BUN: 8 mg/dL (ref 6–20)
CO2: 26 mmol/L (ref 22–32)
Calcium: 8.2 mg/dL — ABNORMAL LOW (ref 8.9–10.3)
Chloride: 103 mmol/L (ref 101–111)
Creatinine, Ser: 0.85 mg/dL (ref 0.61–1.24)
GFR calc Af Amer: >60 mL/min (ref >60)
GFR calc non Af Amer: >60 mL/min (ref >60)
Glucose, Bld: 129 mg/dL — ABNORMAL HIGH (ref 65–99)
Potassium: 3.7 mmol/L (ref 3.5–5.1)
Sodium: 139 mmol/L (ref 135–145)

## 2015-05-31 LAB — POCT I-STAT 4, (NA,K, GLUC, HGB,HCT)
Glucose, Bld: 141 mg/dL — ABNORMAL HIGH (ref 65–99)
HCT: 39 % (ref 39.0–52.0)
Hemoglobin: 13.3 g/dL (ref 13.0–17.0)
Potassium: 4.9 mmol/L (ref 3.5–5.1)
Sodium: 138 mmol/L (ref 135–145)

## 2015-05-31 LAB — CBC
HCT: 36.5 % — ABNORMAL LOW (ref 39.0–52.0)
Hemoglobin: 12.5 g/dL — ABNORMAL LOW (ref 13.0–17.0)
MCH: 32.3 pg (ref 26.0–34.0)
MCHC: 34.2 g/dL (ref 30.0–36.0)
MCV: 94.3 fL (ref 78.0–100.0)
Platelets: 110 10*3/uL — ABNORMAL LOW (ref 150–400)
RBC: 3.87 MIL/uL — ABNORMAL LOW (ref 4.22–5.81)
RDW: 13.1 % (ref 11.5–15.5)
WBC: 10.6 10*3/uL — ABNORMAL HIGH (ref 4.0–10.5)

## 2015-05-31 MED ORDER — ENOXAPARIN SODIUM 40 MG/0.4ML ~~LOC~~ SOLN
40.0000 mg | SUBCUTANEOUS | Status: DC
  Administered 2015-05-31: 40 mg via SUBCUTANEOUS
  Filled 2015-05-31: qty 0.4

## 2015-05-31 MED ORDER — RIVAROXABAN 20 MG PO TABS
20.0000 mg | ORAL_TABLET | Freq: Every day | ORAL | Status: DC
  Administered 2015-06-01 – 2015-06-02 (×2): 20 mg via ORAL
  Filled 2015-05-31 (×2): qty 1

## 2015-05-31 MED ORDER — POTASSIUM CHLORIDE CRYS ER 20 MEQ PO TBCR
40.0000 meq | EXTENDED_RELEASE_TABLET | Freq: Once | ORAL | Status: AC
  Administered 2015-05-31: 40 meq via ORAL
  Filled 2015-05-31: qty 2

## 2015-05-31 NOTE — Progress Notes (Signed)
VASCULAR LAB PRELIMINARY  PRELIMINARY  PRELIMINARY  PRELIMINARY  VASCULAR LAB PRELIMINARY  ARTERIAL  ABI completed:    RIGHT    LEFT    PRESSURE WAVEFORM  PRESSURE WAVEFORM  BRACHIAL 124 triphasic BRACHIAL 140 triphasic  DP 103 monophasic DP 126 triphasic  PT 116 monophasic PT 128 triphasic    RIGHT LEFT  ABI 0.83 0.90    ABI's is suggestive of mild arterial insufficiency disease at rest-bilaterally. Right ABI indicates more bloodflow due post surgery.  Jenetta Loges, RVT 05/31/2015, 5:30 PM      Ziomara Birenbaum, RVT, RDMS 05/31/2015, 5:30 PM

## 2015-05-31 NOTE — Progress Notes (Signed)
Patient transferred to room 2w18, called report to Tanya,RN. Patient stable. Patient transferred on monitor and tolerated well without complaints. Central tele notified of patient transfer. Patient significant other at bedside with patient belongings.

## 2015-05-31 NOTE — Progress Notes (Addendum)
[facility] Progress Note  Subjective  - POD #1  Pain with incisions. Right foot feels better.   Objective Filed Vitals:   05/31/15 0333 05/31/15 0340  BP:  101/63  Pulse:  66  Temp: 97.9 F (36.6 C)   Resp:  12    Intake/Output Summary (Last 24 hours) at 05/31/15 0733 Last data filed at 05/31/15 0600  Gross per 24 hour  Intake   4730 ml  Output   4040 ml  Net    690 ml    Right leg incisions c/d/i Right foot with ulceration between 4th and 5th toes Biphasic DP and PT doppler flow. Unable to palpate PT pulse.   Assessment/Planning: 45 y.o. male is s/p: Redo right SFA to below knee popliteal bypass with 6 mm propaten distal vein patch, acute occlusion of right fem pop bypass with GSV  1 Day Post-Op   Patient had acute occlusion of right fem pop bypass with GSV Doppler exam with biphasic PT and DP doppler flow. However, unable to palpate right PT pulse as found in the OR yesterday. May be secondary to marginal blood pressures. Will recheck later today.  Hold on Xarelto for now.  Lovenox for DVT prophylaxis.  Hgb stable Creatinine stable.  Replete K+ Will check on patient later today and transfer to 2W.   Raymond Gurney 05/31/2015 7:33 AM --  Laboratory CBC    Component Value Date/Time   WBC 10.6* 05/30/2015 2353   WBC 6.7 06/22/2011 1506   HGB 12.5* 05/30/2015 2353   HGB 16.2 06/22/2011 1506   HCT 36.5* 05/30/2015 2353   HCT 46.3 06/22/2011 1506   PLT 110* 05/30/2015 2353   PLT 192 06/22/2011 1506    BMET    Component Value Date/Time   NA 139 05/31/2015 0520   NA 143 06/22/2011 1506   K 3.7 05/31/2015 0520   K 3.9 06/22/2011 1506   CL 103 05/31/2015 0520   CL 106 06/22/2011 1506   CO2 26 05/31/2015 0520   CO2 27 06/22/2011 1506   GLUCOSE 129* 05/31/2015 0520   GLUCOSE 84 06/22/2011 1506   BUN 8 05/31/2015 0520   BUN 16 06/22/2011 1506   CREATININE 0.85 05/31/2015 0520   CREATININE 1.11 06/22/2011 1506   CALCIUM 8.2*  05/31/2015 0520   CALCIUM 9.1 06/22/2011 1506   GFRNONAA >60 05/31/2015 0520   GFRNONAA >60 06/22/2011 1506   GFRAA >60 05/31/2015 0520   GFRAA >60 06/22/2011 1506    COAG Lab Results  Component Value Date   INR 1.02 05/26/2015   INR 1.12 05/05/2015   No results found for: PTT  Antibiotics Anti-infectives    Start     Dose/Rate Route Frequency Ordered Stop   05/30/15 2300  cefUROXime (ZINACEF) 1.5 g in dextrose 5 % 50 mL IVPB     1.5 g 100 mL/hr over 30 Minutes Intravenous Every 12 hours 05/30/15 1502 05/31/15 2259   05/30/15 1645  cefUROXime (ZINACEF) 1.5 g in dextrose 5 % 50 mL IVPB  Status:  Discontinued     1.5 g 100 mL/hr over 30 Minutes Intravenous To Surgery 05/30/15 1641 05/30/15 2121   05/30/15 1130  cefUROXime (ZINACEF) 1.5 g in dextrose 5 % 50 mL IVPB  Status:  Discontinued     1.5 g 100 mL/hr over 30 Minutes Intravenous To [facility] 05/30/15 1131 05/30/15 2107   05/30/15 0645  cefUROXime (ZINACEF) 1.5 g in dextrose 5 % 50 mL IVPB  1.5 g 100 mL/hr over 30 Minutes Intravenous To [facility] 05/27/15 1610 05/30/15 1134       Maris Berger, [place] [facility] Office: [phone] Pager: [phone] 05/31/2015 7:33 AM     Incisions all look good Doppler signal in PT and DP Restart Xaralto Transfer to floor   [employer]

## 2015-05-31 NOTE — Progress Notes (Signed)
Utilization review completed. Wilber Fini, RN, BSN. 

## 2015-05-31 NOTE — Progress Notes (Signed)
PT Evaluation Note  Assessment: Patient is s/p surgery listed below resulting in functional limitations due to the deficits listed below (see PT Problem List).  Pt demonstrated safe use of crutches for ambulation, requiring min guard assist for safety.  He completed stair training w/ min guard assist.  Pt will need to focus on placing weight on Rt LE which he improved on w/ increased ambulatory distance.  Patient will benefit from skilled PT to increase their independence and safety with mobility to allow discharge to the venue listed below.    Michail Jewels PT, DPT [phone] Pager: [phone]   05/31/15 1100  PT Visit Information  Last PT Received On 05/31/15  Assistance Needed +1  History of Present Illness S/P R fem-pop bypass graft, requiring redo due to occlusion. PMH: smoker, emphysema, PAD, R LE DVT/PE, CAD/MI, peripheral neuropathy, gout, depression, vertigo.  Precautions  Precautions Fall  Restrictions  Weight Bearing Restrictions No  Home Living  Family/patient expects to be discharged to: Private residence  Living Arrangements Alone  Available Help at Discharge Family;Available PRN/intermittently  Type of Home Mobile home (double wide)  Home Access Stairs to enter  Entrance Stairs-Number of Steps 3  Entrance Stairs-Rails Can reach both;Left;Right  Home Layout One level  Home Equipment Crutches;Shower seat;Adaptive equipment  Prior Function  Level of Independence Independent with assistive device(s)  Comments working full time job.  Using crutches for practice, not for pain  Communication  Communication No difficulties  Pain Assessment  Pain Assessment 0-10  Pain Score 8  Pain Location Rt LE  Pain Descriptors / Indicators Operative site guarding;Tightness;Grimacing  Pain Intervention(s) Limited activity within patient's tolerance;Monitored during session;Repositioned;Premedicated before session  Cognition  Arousal/Alertness Awake/alert  Behavior During Therapy WFL for  tasks assessed/performed  Overall Cognitive Status Within Functional Limits for tasks assessed  Upper Extremity Assessment  Upper Extremity Assessment Defer to OT evaluation  Lower Extremity Assessment  Lower Extremity Assessment RLE deficits/detail  RLE Deficits / Details limited ROM and strength due to surgery listed above  RLE Unable to fully assess due to pain  Cervical / Trunk Assessment  Cervical / Trunk Assessment Normal  Bed Mobility  General bed mobility comments Pt sitting in recliner chair upon PT arrival  Transfers  Overall transfer level Needs assistance  Equipment used Crutches  Transfers Sit to/from Stand  Sit to Stand Min guard  General transfer comment Demonstration and cues for proper technique for sit<>stand using crutches.  Pt does not place WB on Rt foot due to pain.  Ambulation/Gait  Ambulation/Gait assistance Min guard  Ambulation Distance (Feet) 100 Feet  Assistive device Crutches  Gait Pattern/deviations Step-to pattern;Decreased weight shift to right;Antalgic;Trunk flexed  General Gait Details Pt initially refusing to place weight on Rt LE due to pain but eventually transitions to 3 point gait pattern placing Rt foot flat on floor.  Pt steady using crutches.   Gait velocity interpretation Below normal speed for age/gender  Stairs Yes  Stairs assistance Min guard  Stair Management One rail Right;Step to pattern;Sideways  Number of Stairs 3  General stair comments Cues for technique and close min guard for pt's safety.    Balance  Overall balance assessment Needs assistance  Sitting-balance support No upper extremity supported;Feet supported  Sitting balance-Leahy Scale Good  Standing balance support Bilateral upper extremity supported;During functional activity  Standing balance-Leahy Scale Poor  Standing balance comment Relies on crutches for support  General Comments  General comments (skin integrity, edema, etc.) Pt reports he bought  crutches  intentionally prior to his surgery to pracitce using them.  Exercises  Exercises General Lower Extremity;Other exercises  General Exercises - Lower Extremity  Ankle Circles/Pumps AROM;Both;10 reps;Seated  Long Arc Quad AROM;Both;10 reps;Seated  Other Exercises  Other Exercises Pt encouraged to ambulate w/ nursing staff at least 2 more time today and 3x/day thereafter  PT - End of Session  Equipment Utilized During Treatment Gait belt  Activity Tolerance Patient limited by pain  Patient left in chair;with call bell/phone within reach;with family/visitor present  Nurse Communication Mobility status  PT Assessment  PT Therapy Diagnosis  Difficulty walking;Acute pain  PT Recommendation/Assessment Patient needs continued PT services  PT Problem List Decreased strength;Decreased range of motion;Decreased activity tolerance;Decreased balance;Decreased mobility;Decreased knowledge of use of DME;Decreased safety awareness;Pain  Barriers to Discharge Decreased caregiver support  Barriers to Discharge Comments family available prn/intermittently; steps to enter home  PT Plan  PT Frequency (ACUTE ONLY) Min 3X/week  PT Treatment/Interventions (ACUTE ONLY) DME instruction;Gait training;Stair training;Functional mobility training;Therapeutic activities;Therapeutic exercise;Balance training;Patient/family education;Modalities  PT Recommendation  Follow Up Recommendations Home health PT;Supervision for mobility/OOB  PT equipment None recommended by PT  Individuals Consulted  Consulted and Agree with Results and Recommendations Patient  Acute Rehab PT Goals  Patient Stated Goal decreased pain  PT Goal Formulation With patient  Time For Goal Achievement 06/14/15  Potential to Achieve Goals Good  PT Time Calculation  PT Start Time (ACUTE ONLY) 1114  PT Stop Time (ACUTE ONLY) 1149  PT Time Calculation (min) (ACUTE ONLY) 35 min  PT General Charges  $$ ACUTE PT VISIT 1 Procedure  PT Evaluation  $PT  Eval Moderate Complexity 1 Procedure  PT Treatments  $Gait Training 8-22 mins  Written Expression  Dominant Hand Right

## 2015-05-31 NOTE — Care Management Note (Signed)
Case Management Note  Patient Details  Name: Edward [name] MRN: [id number] Date of Birth: 02/21/1971  Subjective/Objective:     Date: 05/31/15 Spoke with patient at the bedside.  Introduced self as Sports coach and explained role in discharge planning and how to be reached.  Verified patient lives in alone, pta indep. Expressed potential need for no  DME.  Verified patient anticipates to go home alone, at time of discharge and will have  part-time supervision by family (mother lives beside him) at this time to best of their knowledge.  Patient  denied needing help with their medication.  Patient drives and is driven by family to MD appointments.  Verified patient has PCP Terie Purser.  Per ot eval - no ot needs.  Await pt eval.   Plan: CM will continue to follow for discharge planning and [facility] resources.                Action/Plan:   Expected Discharge Date:                  Expected Discharge Plan:  Home/Self Care  In-House Referral:     Discharge planning Services  CM Consult  Post Acute Care Choice:    Choice offered to:     DME Arranged:    DME Agency:     HH Arranged:    HH Agency:     Status of Service:  Completed, signed off  Medicare Important Message Given:    Date Medicare IM Given:    Medicare IM give by:    Date Additional Medicare IM Given:    Additional Medicare Important Message give by:     If discussed at Long Length of Stay Meetings, dates discussed:    Additional Comments:  Leone Haven, RN 05/31/2015, 11:03 AM

## 2015-05-31 NOTE — Evaluation (Signed)
Occupational Therapy Evaluation Patient Details Name: Edward [name] MRN: [id number] DOB: 12-02-1970 Today's Date: 05/31/2015    History of Present Illness S/P R fem-pop bypass graft, requiring redo due to occlusion. PMH: smoker, emphysema, PAD, R LE DVT/PE, CAD/MI, peripheral neuropathy, gout, depression, vertigo.   Clinical Impression   Pt was independent prior to admission. Presents with R LE post operative pain and impaired balance interfering with ability to perform at his baseline.  Pt requiring min guard assist this visit and not able to tolerate weight bearing on his R LE. Pt lives beside his mother and will have as much help as needed at home.  Do not anticipate pt will need post acute OT.    Follow Up Recommendations  No OT follow up    Equipment Recommendations  None recommended by OT    Recommendations for Other Services       Precautions / Restrictions Precautions Precautions: Fall Restrictions Weight Bearing Restrictions: No      Mobility Bed Mobility               General bed mobility comments: pt in chair  Transfers Overall transfer level: Needs assistance Equipment used: Rolling walker (2 wheeled) Transfers: Sit to/from Stand Sit to Stand: Min guard              Balance                                            ADL Overall ADL's : Needs assistance/impaired Eating/Feeding: Independent;Sitting   Grooming: Min guard;Standing   Upper Body Bathing: Set up;Sitting   Lower Body Bathing: Minimal assistance;Sit to/from stand   Upper Body Dressing : Set up;Sitting   Lower Body Dressing: Minimal assistance;Sit to/from stand Lower Body Dressing Details (indicate cue type and reason): instructed to dress operated leg first, family has AE he can borrow Toilet Transfer: Min guard;RW   Toileting- Architect and Hygiene: Min guard;Sit to/from stand       Functional mobility during ADLs: Min guard;Rolling  walker;Cueing for sequencing       Vision     Perception     Praxis      Pertinent Vitals/Pain Pain Assessment: 0-10 Pain Score: 7  Pain Location: R groin Pain Descriptors / Indicators: Operative site guarding;Sore;Grimacing Pain Intervention(s): Premedicated before session;Repositioned;Monitored during session     Hand Dominance Right   Extremity/Trunk Assessment Upper Extremity Assessment Upper Extremity Assessment: Overall WFL for tasks assessed   Lower Extremity Assessment Lower Extremity Assessment: Defer to PT evaluation   Cervical / Trunk Assessment Cervical / Trunk Assessment: Normal   Communication Communication Communication: No difficulties   Cognition Arousal/Alertness: Awake/alert Behavior During Therapy: WFL for tasks assessed/performed Overall Cognitive Status: Within Functional Limits for tasks assessed                     General Comments       Exercises       Shoulder Instructions      Home Living Family/patient expects to be discharged to:: Private residence Living Arrangements: Alone Available Help at Discharge: Family;Available PRN/intermittently Type of Home: Mobile home (double wide) Home Access: Stairs to enter Entrance Stairs-Number of Steps: 3 Entrance Stairs-Rails: Right;Left;Can reach both Home Layout: One level     Bathroom Shower/Tub: Producer, television/film/video: Standard     Home Equipment: Crutches;Shower seat;Adaptive  equipment Adaptive Equipment: Reacher;Sock aid (can borrow both from family members)        Prior Functioning/Environment Level of Independence: Independent        Comments: working full time job on his feet up until sx    OT Diagnosis: Generalized weakness;Acute pain   OT Problem List: Decreased strength;Decreased activity tolerance;Impaired balance (sitting and/or standing);Decreased knowledge of use of DME or AE;Pain   OT Treatment/Interventions: Self-care/ADL training;DME  and/or AE instruction;Patient/family education    OT Goals(Current goals can be found in the care plan section) Acute Rehab OT Goals Patient Stated Goal: stop smoking OT Goal Formulation: With patient Time For Goal Achievement: 06/07/15 Potential to Achieve Goals: Good ADL Goals Pt Will Perform Grooming: with modified independence;standing Pt Will Perform Lower Body Bathing: with modified independence;with adaptive equipment;sit to/from stand Pt Will Perform Lower Body Dressing: with modified independence;with adaptive equipment;sit to/from stand Pt Will Transfer to Toilet: with modified independence;ambulating;regular height toilet Pt Will Perform Toileting - Clothing Manipulation and hygiene: with modified independence;sit to/from stand Pt Will Perform Tub/Shower Transfer: Shower transfer;with modified independence;shower seat;ambulating  OT Frequency: Min 2X/week   Barriers to D/C:            Co-evaluation              End of Session Equipment Utilized During Treatment: Rolling walker  Activity Tolerance: Patient tolerated treatment well Patient left: in chair;with call bell/phone within reach;with family/visitor present   Time: 1610-9604 OT Time Calculation (min): 25 min Charges:  OT General Charges $OT Visit: 1 Procedure OT Evaluation $OT Eval Low Complexity: 1 Procedure OT Treatments $Self Care/Home Management : 8-22 mins G-Codes:    Evern Bio 05/31/2015, 10:22 AM  [phone]

## 2015-05-31 NOTE — Progress Notes (Signed)
Patient transferred from 3S, oriented to room. Tele notified and verified.  Girlfriend at bedside, call light with in reach.

## 2015-06-01 ENCOUNTER — Telehealth: Payer: Self-pay | Admitting: Vascular Surgery

## 2015-06-01 MED ORDER — DIPHENHYDRAMINE HCL 50 MG/ML IJ SOLN: 12.5000 mg | Freq: Four times a day (QID) | INTRAMUSCULAR | Status: DC | PRN

## 2015-06-01 MED ORDER — SODIUM CHLORIDE 0.9% FLUSH
3.0000 mL | Freq: Two times a day (BID) | INTRAVENOUS | Status: DC
  Administered 2015-06-01 – 2015-06-03 (×3): 3 mL via INTRAVENOUS

## 2015-06-01 MED ORDER — MORPHINE SULFATE 2 MG/ML IV SOLN
INTRAVENOUS | Status: DC
  Administered 2015-06-02: 4 mg via INTRAVENOUS
  Administered 2015-06-02 (×2): 3 mg via INTRAVENOUS
  Administered 2015-06-02: 7 mg via INTRAVENOUS
  Administered 2015-06-03 (×2): 3 mg via INTRAVENOUS
  Filled 2015-06-01: qty 25

## 2015-06-01 MED ORDER — SODIUM CHLORIDE 0.9% FLUSH: 3.0000 mL | INTRAVENOUS | Status: DC | PRN

## 2015-06-01 MED ORDER — NALOXONE HCL 0.4 MG/ML IJ SOLN: 0.4000 mg | INTRAMUSCULAR | Status: DC | PRN

## 2015-06-01 MED ORDER — ONDANSETRON HCL 4 MG/2ML IJ SOLN: 4.0000 mg | Freq: Four times a day (QID) | INTRAMUSCULAR | Status: DC | PRN

## 2015-06-01 MED ORDER — SODIUM CHLORIDE 0.9 % IV SOLN: 250.0000 mL | INTRAVENOUS | Status: DC | PRN

## 2015-06-01 MED ORDER — SODIUM CHLORIDE 0.9% FLUSH: 9.0000 mL | INTRAVENOUS | Status: DC | PRN

## 2015-06-01 MED ORDER — DIPHENHYDRAMINE HCL 12.5 MG/5ML PO ELIX: 12.5000 mg | ORAL_SOLUTION | Freq: Four times a day (QID) | ORAL | Status: DC | PRN

## 2015-06-01 NOTE — Progress Notes (Signed)
[facility]  Subjective  - still with significant incisional pain right leg, no pain in foot   Objective 132/79 86 97.4 F (36.3 C) (Oral) 18 98%  Intake/Output Summary (Last 24 hours) at 06/01/15 1504 Last data filed at 06/01/15 1329  Gross per 24 hour  Intake    480 ml  Output   1500 ml  Net  -1020 ml   Right foot pink and warm with biphasic PT doppler, DP monophasic Incisions clean Some edema right leg  ABI 0.8 compared to 0.4 preop  Assessment/Planning: Still with post op incisional pain not controlled with PO pain meds Will give PCA today.  Home when pain controlled with oral meds and ambulatory  Edward [name] 06/01/2015 3:04 PM --  Laboratory Lab Results:  Recent Labs  05/30/15 1820 05/30/15 2353  WBC  --  10.6*  HGB 13.3 12.5*  HCT 39.0 36.5*  PLT  --  110*   BMET  Recent Labs  05/30/15 1820 05/31/15 0520  NA 138 139  K 4.9 3.7  CL  --  103  CO2  --  26  GLUCOSE 141* 129*  BUN  --  8  CREATININE  --  0.85  CALCIUM  --  8.2*    COAG Lab Results  Component Value Date   INR 1.02 05/26/2015   INR 1.12 05/05/2015   No results found for: PTT

## 2015-06-01 NOTE — Progress Notes (Signed)
   06/01/15 1144  PT Visit Information  Reason Eval/Treat Not Completed Pain limiting ability to participate (Pt reports he will not get OOB today despite education and encouragement.  Pt refused supine therapeutic exercise as well.  RN informed.  )

## 2015-06-01 NOTE — Telephone Encounter (Signed)
I called Clydie Braun per instructions with disability form.  She is the girlfriend for the patient and she said he is still in the hospital. She wanted me to just fax the form in or she would pick it up. I explained to her that I could not send the form to the employer without the patient's  permission and the cost is $29.  I asked that she have him call me.  Also, the form is very detailed and I have placed it on Dr. Evelina Dun desk per Okey Regal for him to complete.

## 2015-06-01 NOTE — Progress Notes (Signed)
Occupational Therapy Treatment Patient Details Name: Chrystopher Stangl MRN: [id number] DOB: [birth year] Today's Date: 06/01/2015    History of present illness S/P R fem-pop bypass graft, requiring redo due to occlusion. PMH: smoker, emphysema, PAD, R LE DVT/PE, CAD/MI, peripheral neuropathy, gout, depression, vertigo.   OT comments  Pt with increased pain today and stating medications are helping minimally.  Focus of session on educating pt in LB ADL using adaptive equipment of which he has available at home.  Pt verbalizing understanding of use.  Pt squat pivoting without assist from bed<>chair. Encouraged him to ambulate to bathroom with staff and use of crutches.  Follow Up Recommendations  No OT follow up    Equipment Recommendations  None recommended by OT    Recommendations for Other Services      Precautions / Restrictions Precautions Precautions: Fall Restrictions Weight Bearing Restrictions: No       Mobility Bed Mobility Overal bed mobility: Modified Independent                Transfers     Transfers: Squat Pivot Transfers     Squat pivot transfers: Modified independent (Device/Increase time)     General transfer comment: pt squat-pivoted from chair back to bed    Balance     Sitting balance-Leahy Scale: Good       Standing balance-Leahy Scale: Poor Standing balance comment: decreased tolerance of weight on R foot                   ADL Overall ADL's : Needs assistance/impaired     Grooming: Set up;Sitting         Lower Body Bathing Details (indicate cue type and reason): educated in use of long handled bath sponge and reacher for washing and drying R foot     Lower Body Dressing: Min guard;Sit to/from stand Lower Body Dressing Details (indicate cue type and reason): instructed in use of sock aid and reacher, reinforced dressing operated leg first                      Vision                     Perception      Praxis      Cognition   Behavior During Therapy: Flat affect Overall Cognitive Status: Within Functional Limits for tasks assessed                       Extremity/Trunk Assessment               Exercises     Shoulder Instructions       General Comments      Pertinent Vitals/ Pain       Pain Assessment: Faces Faces Pain Scale: Hurts whole lot Pain Location: R LE Pain Descriptors / Indicators: Grimacing;Operative site guarding Pain Intervention(s): Premedicated before session;Repositioned;Monitored during session;Limited activity within patient's tolerance  Home Living                                          Prior Functioning/Environment              Frequency Min 2X/week     Progress Toward Goals  OT Goals(current goals can now be found in the care plan section)  Progress towards OT goals: Progressing toward goals  Acute Rehab OT Goals Patient Stated Goal: decreased pain  Plan Discharge plan remains appropriate    Co-evaluation                 End of Session     Activity Tolerance Patient limited by pain   Patient Left in bed;with call bell/phone within reach   Nurse Communication          Time: 1610-9604 OT Time Calculation (min): 20 min  Charges: OT General Charges $OT Visit: 1 Procedure OT Treatments $Self Care/Home Management : 8-22 mins  Evern Bio 06/01/2015, 10:48 AM [phone]

## 2015-06-01 NOTE — Progress Notes (Signed)
Patient lying in bed, no distress or needs expressed at this time. On PCA pump at this time, call light within reach.

## 2015-06-02 MED ORDER — BISACODYL 5 MG PO TBEC
10.0000 mg | DELAYED_RELEASE_TABLET | Freq: Every day | ORAL | Status: DC | PRN
  Administered 2015-06-03: 10 mg via ORAL
  Filled 2015-06-02 (×2): qty 2

## 2015-06-02 NOTE — Progress Notes (Addendum)
[facility]  Subjective  - He had a bad day yesterday.  He has increased pain when he puts weight on his right LE.   Objective 122/75 79 98.6 F (37 C) (Oral) 14 98%  Intake/Output Summary (Last 24 hours) at 06/02/15 0736 Last data filed at 06/02/15 9604  Gross per 24 hour  Intake    240 ml  Output   2175 ml  Net  -1935 ml    Right LE incisions clean and dry without hematoma Doppler signals PT/DP/peroneal intact Min-mod edema right foot    Assessment/Planning: POD # 3 Redo right SFA to below knee popliteal bypass with 6 mm Propaten distal vein patch, angiogram  PCA will add PO pain medication for better pain control Elevate the right LE when at rest, increase mobility when pain controlled better.  Clinton Gallant [facility] 06/02/2015 7:36 AM -- Agree with above.  Patent bypass. Pain control still an issue.  Ambulate today. Fabienne Bruns, MD [facility] [place] Office: [phone] Pager: [phone]  Laboratory Lab Results:  Recent Labs  05/30/15 1820 05/30/15 2353  WBC  --  10.6*  HGB 13.3 12.5*  HCT 39.0 36.5*  PLT  --  110*   BMET  Recent Labs  05/30/15 1820 05/31/15 0520  NA 138 139  K 4.9 3.7  CL  --  103  CO2  --  26  GLUCOSE 141* 129*  BUN  --  8  CREATININE  --  0.85  CALCIUM  --  8.2*    COAG Lab Results  Component Value Date   INR 1.02 05/26/2015   INR 1.12 05/05/2015   No results found for: PTT

## 2015-06-02 NOTE — Progress Notes (Signed)
Physical Therapy Treatment Patient Details Name: Edward [name] MRN: [id number] DOB: 12-31-1970 Today's Date: 06/02/2015    History of Present Illness S/P R fem-pop bypass graft, requiring redo due to occlusion. PMH: smoker, emphysema, PAD, R LE DVT/PE, CAD/MI, peripheral neuropathy, gout, depression, vertigo.    PT Comments    Pt ambulated increased gait distance and tolerated stair training.  HR elevated to 140s bpmafter gait training whenn standing to void, during gait training HR elevated to 120s-130s bpm.  After return to bed HR decreased with in functional limits.  RN aware.  Pt required min guard to supervision with all mobility.    Follow Up Recommendations  Home health PT;Supervision for mobility/OOB     Equipment Recommendations  None recommended by PT    Recommendations for Other Services       Precautions / Restrictions Precautions Precautions: Fall Restrictions Weight Bearing Restrictions: No    Mobility  Bed Mobility Overal bed mobility: Modified Independent             General bed mobility comments: slow guarding RLE.  Transfers Overall transfer level: Needs assistance Equipment used: Crutches Transfers: Sit to/from Stand Sit to Stand: Supervision         General transfer comment: Cues for hand placement and foot placement for technique and improve safety.  Pt stood at commode with urinate with crutches and no LOB noted.    Ambulation/Gait Ambulation/Gait assistance: Min guard Ambulation Distance (Feet): 260 Feet Assistive device: Crutches Gait Pattern/deviations: Step-to pattern;Decreased weight shift to right;Trunk flexed     General Gait Details: Pt initially refusing to place weight on Rt LE due to pain but eventually transitions to 3 point gait pattern placing Rt foot flat on floor.  Pt steady using crutches.    Stairs Stairs: Yes Stairs assistance: Min guard Stair Management: Two rails Number of Stairs: 3 General stair comments:  cues for sequencing and hand and foot placement to improve ease of stair negotiation.  Pt required increased VCs for correct sequencing, forward ascending and backwards descending.    Wheelchair Mobility    Modified Rankin (Stroke Patients Only)       Balance     Sitting balance-Leahy Scale: Good       Standing balance-Leahy Scale: Fair                      Cognition Arousal/Alertness: Awake/alert Behavior During Therapy: WFL for tasks assessed/performed Overall Cognitive Status: Within Functional Limits for tasks assessed                      Exercises      General Comments        Pertinent Vitals/Pain Pain Assessment: 0-10 Pain Score: 7  Pain Location: RLE Pain Intervention(s): Limited activity within patient's tolerance;RN gave pain meds during session (pt utilized PCA pump.  Pt limited weight bearing secondary to pain as tx progress able to tolerate increased weight bearing.  )    Home Living                      Prior Function            PT Goals (current goals can now be found in the care plan section) Acute Rehab PT Goals Patient Stated Goal: decreased pain Potential to Achieve Goals: Good    Frequency  Min 3X/week    PT Plan      Co-evaluation  End of Session Equipment Utilized During Treatment: Gait belt Activity Tolerance: Patient limited by pain Patient left: in chair;with call bell/phone within reach;with family/visitor present     Time: 0981-1914 PT Time Calculation (min) (ACUTE ONLY): 29 min  Charges:  $Gait Training: 8-22 mins $Therapeutic Activity: 8-22 mins                    G Codes:      Florestine Avers 2015/06/06, 2:13 PM  Joycelyn Rua, PTA pager [phone]

## 2015-06-03 MED ORDER — OXYCODONE HCL 5 MG PO TABS
15.0000 mg | ORAL_TABLET | ORAL | Status: DC | PRN
  Administered 2015-06-03: 15 mg via ORAL
  Filled 2015-06-03: qty 3

## 2015-06-03 MED ORDER — OXYCODONE HCL 10 MG PO TABS: ORAL_TABLET | ORAL | Status: DC

## 2015-06-03 NOTE — Care Management Note (Signed)
Case Management Note  Patient Details  Name: Edward [name] MRN: [id number] Date of Birth: 03/21/1971  Subjective/Objective:     Date: 05/31/15 Spoke with patient at the bedside.  Introduced self as Sports coach and explained role in discharge planning and how to be reached.  Verified patient lives in alone, pta indep. Expressed potential need for no  DME.  Verified patient anticipates to go home alone, at time of discharge and will have  part-time supervision by family (mother lives beside him) at this time to best of their knowledge.  Patient  denied needing help with their medication.  Patient drives and is driven by family to MD appointments.  Verified patient has PCP Terie Purser.  Per ot eval - no ot needs.  Await pt eval.   Plan: CM will continue to follow for discharge planning and [facility] resources.                Action/Plan:   Expected Discharge Date:    06/03/15              Expected Discharge Plan:  Home/Self Care  In-House Referral:     Discharge planning Services  CM Consult  Post Acute Care Choice:    Choice offered to:     DME Arranged:    DME Agency:     HH Arranged:    HH Agency:     Status of Service:  Completed, signed off  Medicare Important Message Given:    Date Medicare IM Given:    Medicare IM give by:    Date Additional Medicare IM Given:    Additional Medicare Important Message give by:     If discussed at Long Length of Stay Meetings, dates discussed:    Additional Comments:  Darrold Span, RN 06/03/2015, 4:06 PM

## 2015-06-03 NOTE — Progress Notes (Signed)
Order received to discharge.  Telemetry removed and CCMD notified.  IV removed with catheter intact.  Education given to Pt with significant other at bedside.  All questions answered.  Pain medication given to Pt prior to discharge home.  Last pain rating 2 out of 10.  Pt states pain well controlled at this time, no chest pain or sob.  Pt stable at discharge, no s/s of distress.

## 2015-06-03 NOTE — Progress Notes (Signed)
[facility] [place]  Subjective  - Doing a little better walked more yesterday with crunches.     Objective 112/85 106 98 F (36.7 C) (Oral) 20 99%  Intake/Output Summary (Last 24 hours) at 06/03/15 6962 Last data filed at 06/02/15 1935  Gross per 24 hour  Intake   1200 ml  Output   1400 ml  Net   -200 ml    Doppler DP/PT signals , right toe wound healing Incisions C/D/I  Assessment/Planning: POD # 4 Redo right SFA to below knee popliteal bypass with 6 mm Propaten distal vein patch, angiogram  Will wean off PCA and start 10-15 mg oxy IR F/U in 2 weeks Plan D/C home today  Clinton Gallant [facility] 06/03/2015 7:42 AM --  Laboratory Lab Results: No results for input(s): WBC, HGB, HCT, PLT in the last 72 hours. BMET No results for input(s): NA, K, CL, CO2, GLUCOSE, BUN, CREATININE, CALCIUM in the last 72 hours.  COAG Lab Results  Component Value Date   INR 1.02 05/26/2015   INR 1.12 05/05/2015   No results found for: PTT

## 2015-06-06 DIAGNOSIS — Z0279 Encounter for issue of other medical certificate: Secondary | ICD-10-CM

## 2015-06-09 ENCOUNTER — Encounter: Payer: Self-pay | Admitting: Vascular Surgery

## 2015-06-16 ENCOUNTER — Ambulatory Visit (INDEPENDENT_AMBULATORY_CARE_PROVIDER_SITE_OTHER): Payer: PRIVATE HEALTH INSURANCE | Admitting: Vascular Surgery

## 2015-06-16 ENCOUNTER — Encounter: Payer: Self-pay | Admitting: Vascular Surgery

## 2015-06-16 VITALS — BP 128/88 | HR 91 | Temp 97.8°F | Resp 16 | Ht 66.0 in | Wt 120.0 lb

## 2015-06-16 DIAGNOSIS — I739 Peripheral vascular disease, unspecified: Secondary | ICD-10-CM

## 2015-06-16 MED ORDER — OXYCODONE-ACETAMINOPHEN 5-325 MG PO TABS: 1.0000 | ORAL_TABLET | ORAL | Status: DC | PRN

## 2015-06-16 NOTE — Progress Notes (Signed)
Patient is a 45 year old male returns for postoperative follow-up today after recent distal right superficial femoral artery to below-knee popliteal bypass. This was initially done with a vein but occluded on postop day 0. It was then replaced with a 6 mm propatent PTFE graft with a vein cuff distally. She was done for a nonhealing wound between toes 4 and 5. He thinks the wound is slowly healing. He is still having some problems with swelling in the leg. He also still has some pain in both incisions and behind the knee. He is currently ambulating with crutches. Unfortunately he continues to smoke but is trying to quit.   Physical exam:  Filed Vitals:   06/16/15 1303  BP: 128/88  Pulse: 91  Temp: 97.8 F (36.6 C)  TempSrc: Oral  Resp: 16  Height:  (1.676 m)  Weight: 120 lb (54.432 kg)  SpO2: 99%    Above and below-knee incisions healing well Pulses not palpable in the right foot but has biphasic PT and DP Doppler flow PT greater than DP Wound between the right fourth and fifth toes is essentially healed.  Postop ABIs in the hospital went from 0.4-0.8.  Assessment: Doing well status post right leg bypass.  Plan: Patient will follow-up in 3 months time with repeat ABIs in a graft duplex scan. He was given a renewal of his prescription for Percocet No. 30 dispensed today. He should not need any further refills.  Fabienne Bruns, MD [facility] [place] Office: [phone] Pager: [phone]

## 2015-06-16 NOTE — Discharge Summary (Signed)
[facility] Discharge Summary   Patient ID:  Edward [name] MRN: [id number] DOB/AGE: 1971-02-11 45 y.o.  Admit date: 05/30/2015 Discharge date: 06/03/2015 Date of Surgery: 05/30/2015 Surgeon: Surgeon(s): Sherren Kerns, MD  Admission Diagnosis: peripheral vascular disease with claudication thrombossed fem- pop  Discharge Diagnoses:  peripheral vascular disease with claudication thrombossed fem- pop  Secondary Diagnoses: Past Medical History  Diagnosis Date  . Pneumothorax 2006    s/p chest tube placement x 2   . Emphysema   . DVT (deep venous thrombosis) (HCC)     right leg-takes Xarelto daily bridging with Lovenox for surgery  . Coronary artery disease   . Peripheral vascular disease (HCC)   . Myocardial infarction (HCC) 2012  . Pulmonary embolism (HCC)   . Emphysema lung (HCC)   . Shortness of breath dyspnea     with exertion  . History of bronchitis   . Vertigo     rarely but doesn't take any meds  . Peripheral neuropathy (HCC)     takes Gabapentin daily  . History of gout     not on any meds  . Depression     not on any meds    Procedure(s): RIGHT BYPASS GRAFT FEMORAL-POPLITEAL ARTERY USING PROPATEN 50mmx80cm  GRAFT; WITH VEIN CUFF INTRA OPERATIVE ARTERIOGRAM  Discharged Condition: good  HPI: Patient is a 45 y.o. male who presents for evaluation of rest pain right foot with ulceration. The patient presented to the emergency room several weeks ago complaining of right foot pain. He had ABIs performed at that time which showed an ABI on the right side 0.39. Subsequently he is now developed ulcerations between his fourth and fifth toe on the right foot. He also has claudication symptoms after walking 50-60 feet. He is somewhat limited at work due to his claudication symptoms. He has also had pain at rest in the right foot about 8 months. Other medical problems include Coronary artery disease status post stenting several years ago. COPD which  has been stable. Patient is a current everyday smoker. Greater than 3 minutes today spent regarding smoking cessation counseling. He is also on Xarelto from a pulmonary embolus that he had in June 2016.  Angiogram performed: 05/18/2015 Impression: #1 luminal irregularity within the mid abdominal aorta, unable to be fully characterized by catheter-based angiography. CT scan would be beneficial. There is no pressure gradient across this lesion #2 occluded right popliteal artery with reconstitution of the above-knee popliteal artery and two-vessel runoff via the posterior tibial and peroneal artery #3 no significant left lower extremity stenosis     Hospital Course:  Shubham Thackston is a 45 y.o. male is S/P   05/30/2015:Right superficial femoral artery to below knee popliteal bypass with reversed ipsilateral great saphenous vein, intraop agram Post op in PACU 2:31 pm Incisions: Right leg incisions clean and intact. No hematoma.  Extremities: Nonpalpable right pedal pulses. Brisk monophasic right PT doppler flow. Very faint right DP doppler signal.   Debby Bud. In PACU @ 1420 to assess patient. Pulses diminished from immediate O.R. Assessment at end of case. Ultrasound ordered. Family in PACU @ 1555 after being updated by Dr. Darrick Penna. Consent signed by sister to take pt back to O.R.  05/30/2015 8:27 PM Redo right SFA to below knee popliteal bypass with 6 mm Propaten distal vein patch, angiogram  Pod#1     Patient had acute occlusion of right fem pop bypass with GSV Doppler exam with biphasic PT and DP doppler flow. However,  unable to palpate right PT pulse as found in the OR yesterday. May be secondary to marginal blood pressures. Will recheck later today.  Hold on Xarelto for now.  Lovenox for DVT prophylaxis.  Hgb stable Creatinine stable.  Replete K+ Will check on patient later today and transfer to 2W.   Pod#2 Still with post op  incisional pain not controlled with PO pain meds Will give PCA today. Home when pain controlled with oral meds and ambulatory  Pod#3 Above and below-knee incisions healing well Pulses not palpable in the right foot but has biphasic PT and DP Doppler flow PT greater than DP Wound between the right fourth and fifth toes is essentially healed.  Postop ABIs in the hospital went from 0.4-0.8.  Assessment: Doing well status post right leg bypass. Pain finally controlled. Percocet PO. Disposition stable D/C home.      Significant Diagnostic Studies: CBC Lab Results  Component Value Date   WBC 10.6* 05/30/2015   HGB 12.5* 05/30/2015   HCT 36.5* 05/30/2015   MCV 94.3 05/30/2015   PLT 110* 05/30/2015    BMET    Component Value Date/Time   NA 139 05/31/2015 0520   NA 143 06/22/2011 1506   K 3.7 05/31/2015 0520   K 3.9 06/22/2011 1506   CL 103 05/31/2015 0520   CL 106 06/22/2011 1506   CO2 26 05/31/2015 0520   CO2 27 06/22/2011 1506   GLUCOSE 129* 05/31/2015 0520   GLUCOSE 84 06/22/2011 1506   BUN 8 05/31/2015 0520   BUN 16 06/22/2011 1506   CREATININE 0.85 05/31/2015 0520   CREATININE 1.11 06/22/2011 1506   CALCIUM 8.2* 05/31/2015 0520   CALCIUM 9.1 06/22/2011 1506   GFRNONAA >60 05/31/2015 0520   GFRNONAA >60 06/22/2011 1506   GFRAA >60 05/31/2015 0520   GFRAA >60 06/22/2011 1506   COAG Lab Results  Component Value Date   INR 1.02 05/26/2015   INR 1.12 05/05/2015     Disposition:  Discharge to :Home Discharge Instructions    Call MD for:  redness, tenderness, or signs of infection (pain, swelling, bleeding, redness, odor or green/yellow discharge around incision site)    Complete by:  As directed      Call MD for:  severe or increased pain, loss or decreased feeling  in affected limb(s)    Complete by:  As directed      Call MD for:  temperature >100.5    Complete by:  As directed      Discharge instructions    Complete by:  As directed   You may shower  daily as needed     Driving Restrictions    Complete by:  As directed   No driving for 2 weeks     Increase activity slowly    Complete by:  As directed   Walk with assistance use walker or cane as needed     Lifting restrictions    Complete by:  As directed   No lifting for 6 weeks     Resume previous diet    Complete by:  As directed             Medication List    TAKE these medications        enoxaparin 60 MG/0.6ML injection  Commonly known as:  LOVENOX  Inject Lovenox 60 mg, subcutaneously, x 1 dose AM of 05/29/15.     NEURONTIN 300 MG capsule  Generic drug:  gabapentin  Take 600 mg by  mouth daily.     nitroGLYCERIN 0.4 MG SL tablet  Commonly known as:  NITROSTAT  Place 0.4 mg under the tongue every 5 (five) minutes as needed. Reported on 05/12/2015     Oxycodone HCl 10 MG Tabs  Take 1.5 tablets q 4 hours PRN pain.     rivaroxaban 20 MG Tabs tablet  Commonly known as:  XARELTO  Take 20 mg by mouth daily with supper.       Verbal and written Discharge instructions given to the patient. Wound care per Discharge AVS     Follow-up Information    Follow up with Fabienne Bruns, MD In 6 weeks.   Specialties:  Vascular Surgery, Cardiology   Why:  Office will call you to arrange your appt (sent)   Contact information:   [address] [place] [address] [phone]       Signed: Clinton Gallant [facility] 06/16/2015, 1:27 PM  - For VQI Registry use --- Instructions: Press F2 to tab through selections.  Delete question if not applicable.   Post-op:  Wound infection: No  Graft infection: No  Transfusion: No  If yes, 0 units given New Arrhythmia: No Ipsilateral amputation: [ x] no, [ ]  Minor, [ ]  BKA, [ ]  AKA Discharge patency: [ ]  Primary, [x ] Primary assisted, [ ]  Secondary, [ ]  Occluded Patency judged by: [ ]  Dopper only, [ ]  Palpable graft pulse, [ ]  Palpable distal pulse, [ ]  ABI inc. > 0.15, [ ]  Duplex Discharge ABI: R 0.8, L 0.9  D/C Ambulatory  Status: Ambulatory  Complications: MI: [x ] No, [ ]  Troponin only, [ ]  EKG or Clinical CHF: No Resp failure: [x ] none, [ ]  Pneumonia, [ ]  Ventilator Chg in renal function: [x ] none, [ ]  Inc. Cr > 0.5, [ ]  Temp. Dialysis, [ ]  Permanent dialysis Stroke: [x ] None, [ ]  Minor, [ ]  Major Return to OR: Yes  Reason for return to OR: [ ]  Bleeding, [ ]  Infection, [x ] Thrombosis, [ ]  Revision  Discharge medications: Statin use:  No  for medical reason   ASA use:  No  for medical reason   Plavix use:  No  for medical reason   Beta blocker use: No  for medical reason   Coumadin use: No  for medical reason  xarelto

## 2015-06-16 NOTE — Addendum Note (Signed)
Addended by: Adria Dill L on: 06/16/2015 03:48 PM   Modules accepted: Orders

## 2015-06-30 ENCOUNTER — Telehealth: Payer: Self-pay | Admitting: *Deleted

## 2015-06-30 ENCOUNTER — Encounter (HOSPITAL_COMMUNITY): Payer: Self-pay

## 2015-06-30 ENCOUNTER — Inpatient Hospital Stay (HOSPITAL_COMMUNITY)
Admission: EM | Admit: 2015-06-30 | Discharge: 2015-07-08 | DRG: 253 | Disposition: A | Discharge status: Home Health | Payer: PRIVATE HEALTH INSURANCE | Attending: Vascular Surgery | Admitting: Vascular Surgery

## 2015-06-30 ENCOUNTER — Emergency Department (HOSPITAL_COMMUNITY): Payer: PRIVATE HEALTH INSURANCE

## 2015-06-30 DIAGNOSIS — I252 Old myocardial infarction: Secondary | ICD-10-CM

## 2015-06-30 DIAGNOSIS — Z955 Presence of coronary angioplasty implant and graft: Secondary | ICD-10-CM

## 2015-06-30 DIAGNOSIS — I749 Embolism and thrombosis of unspecified artery: Secondary | ICD-10-CM | POA: Diagnosis not present

## 2015-06-30 DIAGNOSIS — I82441 Acute embolism and thrombosis of right tibial vein: Secondary | ICD-10-CM | POA: Diagnosis present

## 2015-06-30 DIAGNOSIS — G629 Polyneuropathy, unspecified: Secondary | ICD-10-CM | POA: Diagnosis present

## 2015-06-30 DIAGNOSIS — Z86711 Personal history of pulmonary embolism: Secondary | ICD-10-CM

## 2015-06-30 DIAGNOSIS — T82898A Other specified complication of vascular prosthetic devices, implants and grafts, initial encounter: Secondary | ICD-10-CM | POA: Diagnosis not present

## 2015-06-30 DIAGNOSIS — Z419 Encounter for procedure for purposes other than remedying health state, unspecified: Secondary | ICD-10-CM

## 2015-06-30 DIAGNOSIS — I2510 Atherosclerotic heart disease of native coronary artery without angina pectoris: Secondary | ICD-10-CM | POA: Diagnosis present

## 2015-06-30 DIAGNOSIS — F17200 Nicotine dependence, unspecified, uncomplicated: Secondary | ICD-10-CM | POA: Diagnosis present

## 2015-06-30 DIAGNOSIS — Z7901 Long term (current) use of anticoagulants: Secondary | ICD-10-CM

## 2015-06-30 DIAGNOSIS — Z9889 Other specified postprocedural states: Secondary | ICD-10-CM

## 2015-06-30 DIAGNOSIS — Y832 Surgical operation with anastomosis, bypass or graft as the cause of abnormal reaction of the patient, or of later complication, without mention of misadventure at the time of the procedure: Secondary | ICD-10-CM | POA: Diagnosis present

## 2015-06-30 DIAGNOSIS — I739 Peripheral vascular disease, unspecified: Secondary | ICD-10-CM | POA: Diagnosis present

## 2015-06-30 DIAGNOSIS — I70211 Atherosclerosis of native arteries of extremities with intermittent claudication, right leg: Secondary | ICD-10-CM | POA: Diagnosis present

## 2015-06-30 DIAGNOSIS — I70229 Atherosclerosis of native arteries of extremities with rest pain, unspecified extremity: Secondary | ICD-10-CM | POA: Diagnosis present

## 2015-06-30 HISTORY — DX: Systemic involvement of connective tissue, unspecified: M35.9

## 2015-06-30 LAB — BASIC METABOLIC PANEL
Anion gap: 10 (ref 5–15)
BUN: 10 mg/dL (ref 6–20)
CO2: 28 mmol/L (ref 22–32)
Calcium: 9.4 mg/dL (ref 8.9–10.3)
Chloride: 102 mmol/L (ref 101–111)
Creatinine, Ser: 1.02 mg/dL (ref 0.61–1.24)
GFR calc Af Amer: >60 mL/min (ref >60)
GFR calc non Af Amer: >60 mL/min (ref >60)
Glucose, Bld: 77 mg/dL (ref 65–99)
Potassium: 3.9 mmol/L (ref 3.5–5.1)
Sodium: 140 mmol/L (ref 135–145)

## 2015-06-30 LAB — PROTIME-INR
INR: 1.42 (ref 0.00–1.49)
Prothrombin Time: 17.4 seconds — ABNORMAL HIGH (ref 11.6–15.2)

## 2015-06-30 LAB — CBC WITH DIFFERENTIAL/PLATELET
Basophils Absolute: 0 10*3/uL (ref 0.0–0.1)
Basophils Relative: 1 %
Eosinophils Absolute: 0.3 10*3/uL (ref 0.0–0.7)
Eosinophils Relative: 4 %
HCT: 44.2 % (ref 39.0–52.0)
Hemoglobin: 14.7 g/dL (ref 13.0–17.0)
Lymphocytes Relative: 41 %
Lymphs Abs: 3 10*3/uL (ref 0.7–4.0)
MCH: 32 pg (ref 26.0–34.0)
MCHC: 33.3 g/dL (ref 30.0–36.0)
MCV: 96.3 fL (ref 78.0–100.0)
Monocytes Absolute: 0.4 10*3/uL (ref 0.1–1.0)
Monocytes Relative: 5 %
Neutro Abs: 3.7 10*3/uL (ref 1.7–7.7)
Neutrophils Relative %: 49 %
Platelets: 188 10*3/uL (ref 150–400)
RBC: 4.59 MIL/uL (ref 4.22–5.81)
RDW: 13.1 % (ref 11.5–15.5)
WBC: 7.4 10*3/uL (ref 4.0–10.5)

## 2015-06-30 LAB — APTT: aPTT: 36 seconds (ref 24–37)

## 2015-06-30 MED ORDER — IOHEXOL 350 MG/ML SOLN
100.0000 mL | Freq: Once | INTRAVENOUS | Status: AC | PRN
  Administered 2015-06-30: 100 mL via INTRAVENOUS

## 2015-06-30 MED ORDER — SODIUM CHLORIDE 0.9 % IV SOLN
INTRAVENOUS | Status: DC
  Administered 2015-06-30 – 2015-07-04 (×9): via INTRAVENOUS

## 2015-06-30 NOTE — Progress Notes (Signed)
   Daily Progress Note  Assessment/Planning: S/p failed R SFA to BK pop bypass with ips rGSV S/p Redo R SFA to BK pop bypass with Propaten, vein patch pop artery   No evidence of threatened limb  2-3 days have already elapsed with sx  CTA abd/pelvis with BLE runoff to evaluate bypass graft  If patent, can follow up with Dr. Darrick PennaFields this coming week  If bypass graft is occluded, admit for weekend to reverse Noel ChristmasXarleto, then possible subsequent revascularization procedures on Monday with Dr. Darrick PennaFields   Subjective    2-3 days of right foot pain and toes turning blue, especially when standing up  Objective Filed Vitals:   06/30/15 1917  BP: 118/83  Pulse: 81  Temp: 97.9 F (36.6 C)  TempSrc: Oral  Resp: 16  SpO2: 98%   No intake or output data in the 24 hours ending 06/30/15 2109  PULM  CTAB CV  RRR GI  soft, NTND VASC  Somewhat cyanotic R great toe, +pink color in R foot without warmth, Motor and sensation intact throughout R foot, +PT signal  Current Facility-Administered Medications  Medication Dose Route Frequency Provider Last Rate Last Dose  . 0.9 %  sodium chloride infusion   Intravenous Continuous Linwood DibblesJon Knapp, MD       Current Outpatient Prescriptions  Medication Sig Dispense Refill  . FLUoxetine (PROZAC) 20 MG tablet Take 1 tablet by mouth daily.    Marland Kitchen. oxyCODONE 10 MG TABS Take 1.5 tablets q 4 hours PRN pain. 40 tablet 0  . oxyCODONE-acetaminophen (PERCOCET/ROXICET) 5-325 MG tablet Take 1 tablet by mouth every 4 (four) hours as needed for severe pain. 30 tablet 0  . rivaroxaban (XARELTO) 20 MG TABS tablet Take 20 mg by mouth daily with supper.    . enoxaparin (LOVENOX) 60 MG/0.6ML injection Inject Lovenox 60 mg, subcutaneously, x 1 dose AM of 05/29/15. 1 Syringe 0  . nitroGLYCERIN (NITROSTAT) 0.4 MG SL tablet Place 0.4 mg under the tongue every 5 (five) minutes as needed. Reported on 06/30/2015      Laboratory CBC    Component Value Date/Time   WBC 10.6*  05/30/2015 2353   WBC 6.7 06/22/2011 1506   HGB 12.5* 05/30/2015 2353   HGB 16.2 06/22/2011 1506   HCT 36.5* 05/30/2015 2353   HCT 46.3 06/22/2011 1506   PLT 110* 05/30/2015 2353   PLT 192 06/22/2011 1506    BMET    Component Value Date/Time   NA 139 05/31/2015 0520   NA 143 06/22/2011 1506   K 3.7 05/31/2015 0520   K 3.9 06/22/2011 1506   CL 103 05/31/2015 0520   CL 106 06/22/2011 1506   CO2 26 05/31/2015 0520   CO2 27 06/22/2011 1506   GLUCOSE 129* 05/31/2015 0520   GLUCOSE 84 06/22/2011 1506   BUN 8 05/31/2015 0520   BUN 16 06/22/2011 1506   CREATININE 0.85 05/31/2015 0520   CREATININE 1.11 06/22/2011 1506   CALCIUM 8.2* 05/31/2015 0520   CALCIUM 9.1 06/22/2011 1506   GFRNONAA >60 05/31/2015 0520   GFRNONAA >60 06/22/2011 1506   GFRAA >60 05/31/2015 0520   GFRAA >60 06/22/2011 1506    Leonides SakeBrian Juriel Cid, MD [facility] [place] Office: [phone] Pager: [phone]  06/30/2015, 9:09 PM

## 2015-06-30 NOTE — ED Notes (Signed)
Patient reports his right foot is cold and that his toenails are turning purple/blue. He states that he has a weak pulse in his foot and pulse is palpable. Patient has had right femoral-popliteal BPG by Dr. Darrick PennaFields on 05-30-15 and was last seen in the office on 06-16-15. Patient was told to go to the ED for evaluation of BPG for blood clot.

## 2015-06-30 NOTE — Telephone Encounter (Signed)
Patient called in to report that his right foot is ice cold and his toenails are turning purple. He states that he has a weak pulse in his foot.  He has had some cramping pain in the RLE x 2-3 days but not any discoloration. Patient has had right femoral-popliteal BPG by Dr. Darrick PennaFields on 05-30-15 and was last seen in the office on 06-16-15.  Patient is instructed to go to the ED immediately for evaluation of BPG for thrombus. He reluctantly voiced agreement  but will go to the ED.

## 2015-06-30 NOTE — ED Notes (Signed)
Patient transported to CT 

## 2015-06-30 NOTE — ED Provider Notes (Signed)
CSN: [id number]     Arrival date & time 06/30/15  1854 History   First MD Initiated Contact with Patient 06/30/15 2014     Chief Complaint  Patient presents with  . Post-op Problem  . Claudication    HPI Patient presents to the emergency room with complaints of his right foot feeling cold as well as discoloration of his toenails. The patient has a history of a right femoral pop bypass by Dr. Darrick Penna on February 13 of this year. Patient had been doing well until last few days when he started noticing the discoloration of his toes. The patient went to his vascular surgeon's office today but he did not have an appointment and was not able to be seen. He was told to come to the emergency room for further evaluation Past Medical History  Diagnosis Date  . Pneumothorax 2006    s/p chest tube placement x 2   . Emphysema   . DVT (deep venous thrombosis) (HCC)     right leg-takes Xarelto daily bridging with Lovenox for surgery  . Coronary artery disease   . Peripheral vascular disease (HCC)   . Myocardial infarction (HCC) 2012  . Pulmonary embolism (HCC)   . Emphysema lung (HCC)   . Shortness of breath dyspnea     with exertion  . History of bronchitis   . Vertigo     rarely but doesn't take any meds  . Peripheral neuropathy (HCC)     takes Gabapentin daily  . History of gout     not on any meds  . Depression     not on any meds  . Collagen vascular disease [facility])    Past Surgical History  Procedure Laterality Date  . Chest tube insertion    . Peripheral vascular catheterization N/A 05/18/2015    Procedure: Abdominal Aortogram;  Surgeon: Nada Libman, MD;  Location: [facility] INVASIVE CV LAB;  Service: Cardiovascular;  Laterality: N/A;  . Cardiac catheterization  2012    s/p stent placement  . Coronary angioplasty with stent placement      1 stent  . Hernia repair Right     inguinal  . Femoral-popliteal bypass graft Right 05/30/2015    Procedure: RIGHT SUPERFICIAL FEMORAL TO BELOW KNEE  POPLITEAL ARTERY BYPASS GRAFT;  Surgeon: Sherren Kerns, MD;  Location: [facility] OR;  Service: Vascular;  Laterality: Right;  . Vein harvest Right 05/30/2015    Procedure: RIGHT GREAT SAPHENOUS VEIN HARVEST;  Surgeon: Sherren Kerns, MD;  Location: [facility] OR;  Service: Vascular;  Laterality: Right;  . Intraoperative arteriogram Right 05/30/2015    Procedure: INTRA OPERATIVE ARTERIOGRAM;  Surgeon: Sherren Kerns, MD;  Location: [facility] OR;  Service: Vascular;  Laterality: Right;  . Femoral-popliteal bypass graft Right 05/30/2015    Procedure: RIGHT BYPASS GRAFT FEMORAL-POPLITEAL ARTERY USING PROPATEN 4mmx80cm  GRAFT; WITH VEIN CUFF;  Surgeon: Sherren Kerns, MD;  Location: [facility] OR;  Service: Vascular;  Laterality: Right;  . Intraoperative arteriogram Right 05/30/2015    Procedure: INTRA OPERATIVE ARTERIOGRAM;  Surgeon: Sherren Kerns, MD;  Location: [facility] OR;  Service: Vascular;  Laterality: Right;   Family History  Problem Relation Age of Onset  . Family history unknown: Yes   Social History  Substance Use Topics  . Smoking status: Light Tobacco Smoker -- 0.50 packs/day for 25 years    Types: Cigarettes  . Smokeless tobacco: Never Used  . Alcohol Use: No    Review of Systems  All other  systems reviewed and are negative.     Allergies  Lisinopril  Home Medications   Prior to Admission medications   Medication Sig Start Date End Date Taking? Authorizing Provider  FLUoxetine (PROZAC) 20 MG tablet Take 1 tablet by mouth daily. 05/28/15  Yes Historical Provider, MD  oxyCODONE 10 MG TABS Take 1.5 tablets q 4 hours PRN pain. 06/03/15  Yes Lars MageEmma M Collins, PA-C  oxyCODONE-acetaminophen (PERCOCET/ROXICET) 5-325 MG tablet Take 1 tablet by mouth every 4 (four) hours as needed for severe pain. 06/16/15  Yes Sherren Kernsharles E Fields, MD  rivaroxaban (XARELTO) 20 MG TABS tablet Take 20 mg by mouth daily with supper.   Yes Historical Provider, MD  enoxaparin (LOVENOX) 60 MG/0.6ML injection Inject Lovenox 60 mg,  subcutaneously, x 1 dose AM of 05/29/15. 05/26/15   Sherren Kernsharles E Fields, MD  nitroGLYCERIN (NITROSTAT) 0.4 MG SL tablet Place 0.4 mg under the tongue every 5 (five) minutes as needed. Reported on 06/30/2015    Historical Provider, MD   BP 125/77 mmHg  Pulse 61  Temp(Src) 97.9 F (36.6 C) (Oral)  Resp 16  SpO2 97% Physical Exam  Constitutional: He appears well-developed and well-nourished. No distress.  HENT:  Head: Normocephalic and atraumatic.  Right Ear: External ear normal.  Left Ear: External ear normal.  Eyes: Conjunctivae are normal. Right eye exhibits no discharge. Left eye exhibits no discharge. No scleral icterus.  Neck: Neck supple. No tracheal deviation present.  Cardiovascular: Normal rate, regular rhythm and intact distal pulses.   Unable to palpate dorsalis pedis pulses, foot is not cyanotic although there is pallor of the toes and distal aspect of the right foot; palpable pulse dorsalis pedis of the left foot  Pulmonary/Chest: Effort normal and breath sounds normal. No stridor. No respiratory distress. He has no wheezes. He has no rales.  Abdominal: Soft. Bowel sounds are normal. He exhibits no distension. There is no tenderness. There is no rebound and no guarding.  Musculoskeletal: He exhibits no edema or tenderness.  Neurological: He is alert. He has normal strength. No cranial nerve deficit (no facial droop, extraocular movements intact, no slurred speech) or sensory deficit. He exhibits normal muscle tone. He displays no seizure activity. Coordination normal.  Skin: Skin is warm and dry. No rash noted.  Psychiatric: He has a normal mood and affect.  Nursing note and vitals reviewed.   ED Course  Procedures (including critical care time) Labs Review Labs Reviewed  PROTIME-INR - Abnormal; Notable for the following:    Prothrombin Time 17.4 (*)    All other components within normal limits  CBC WITH DIFFERENTIAL/PLATELET  BASIC METABOLIC PANEL  APTT     MDM   Final  diagnoses:  Arterial thrombosis (HCC)  S/P popliteal-distal bypass  Claudication of right lower extremity (HCC)    Pt was also seen by Dr Imogene Burnhen in the ED.  Plan on CT angio of the lower extremity to assess for patency of the surgical procedure.  Not a candidate for emergency treatment right now as he is on xarelto.  If there is an obstruction, he will be admitted.    Linwood DibblesJon Fong Mccarry, MD 07/01/15 [id number]

## 2015-07-01 DIAGNOSIS — Z955 Presence of coronary angioplasty implant and graft: Secondary | ICD-10-CM | POA: Diagnosis not present

## 2015-07-01 DIAGNOSIS — F17200 Nicotine dependence, unspecified, uncomplicated: Secondary | ICD-10-CM | POA: Diagnosis present

## 2015-07-01 DIAGNOSIS — Z01810 Encounter for preprocedural cardiovascular examination: Secondary | ICD-10-CM | POA: Diagnosis not present

## 2015-07-01 DIAGNOSIS — Z7901 Long term (current) use of anticoagulants: Secondary | ICD-10-CM | POA: Diagnosis not present

## 2015-07-01 DIAGNOSIS — I749 Embolism and thrombosis of unspecified artery: Secondary | ICD-10-CM | POA: Diagnosis present

## 2015-07-01 DIAGNOSIS — I2510 Atherosclerotic heart disease of native coronary artery without angina pectoris: Secondary | ICD-10-CM | POA: Diagnosis present

## 2015-07-01 DIAGNOSIS — I739 Peripheral vascular disease, unspecified: Secondary | ICD-10-CM | POA: Diagnosis not present

## 2015-07-01 DIAGNOSIS — T82898A Other specified complication of vascular prosthetic devices, implants and grafts, initial encounter: Secondary | ICD-10-CM | POA: Diagnosis present

## 2015-07-01 DIAGNOSIS — G629 Polyneuropathy, unspecified: Secondary | ICD-10-CM | POA: Diagnosis present

## 2015-07-01 DIAGNOSIS — Y832 Surgical operation with anastomosis, bypass or graft as the cause of abnormal reaction of the patient, or of later complication, without mention of misadventure at the time of the procedure: Secondary | ICD-10-CM | POA: Diagnosis present

## 2015-07-01 DIAGNOSIS — I70211 Atherosclerosis of native arteries of extremities with intermittent claudication, right leg: Secondary | ICD-10-CM | POA: Diagnosis present

## 2015-07-01 DIAGNOSIS — I252 Old myocardial infarction: Secondary | ICD-10-CM | POA: Diagnosis not present

## 2015-07-01 DIAGNOSIS — Z86711 Personal history of pulmonary embolism: Secondary | ICD-10-CM | POA: Diagnosis not present

## 2015-07-01 DIAGNOSIS — I82441 Acute embolism and thrombosis of right tibial vein: Secondary | ICD-10-CM | POA: Diagnosis present

## 2015-07-01 DIAGNOSIS — I70229 Atherosclerosis of native arteries of extremities with rest pain, unspecified extremity: Secondary | ICD-10-CM | POA: Diagnosis present

## 2015-07-01 LAB — HEPARIN LEVEL (UNFRACTIONATED): Heparin Unfractionated: 1.16 IU/mL — ABNORMAL HIGH (ref 0.30–0.70)

## 2015-07-01 MED ORDER — MORPHINE SULFATE (PF) 2 MG/ML IV SOLN
2.0000 mg | INTRAVENOUS | Status: DC | PRN
  Administered 2015-07-01 (×3): 2 mg via INTRAVENOUS
  Administered 2015-07-01 – 2015-07-02 (×3): 4 mg via INTRAVENOUS
  Administered 2015-07-02 (×2): 2 mg via INTRAVENOUS
  Administered 2015-07-02 – 2015-07-04 (×9): 4 mg via INTRAVENOUS
  Filled 2015-07-01 (×2): qty 2
  Filled 2015-07-01: qty 1
  Filled 2015-07-01 (×3): qty 2
  Filled 2015-07-01 (×2): qty 1
  Filled 2015-07-01 (×3): qty 2
  Filled 2015-07-01: qty 1
  Filled 2015-07-01 (×6): qty 2

## 2015-07-01 MED ORDER — FLEET ENEMA 7-19 GM/118ML RE ENEM
1.0000 | ENEMA | Freq: Once | RECTAL | Status: DC | PRN
Start: 2015-07-01 — End: 2015-07-04

## 2015-07-01 MED ORDER — ACETAMINOPHEN 325 MG PO TABS: 325.0000 mg | ORAL_TABLET | ORAL | Status: DC | PRN

## 2015-07-01 MED ORDER — HYDRALAZINE HCL 20 MG/ML IJ SOLN: 5.0000 mg | INTRAMUSCULAR | Status: DC | PRN

## 2015-07-01 MED ORDER — NITROGLYCERIN 0.4 MG SL SUBL: 0.4000 mg | SUBLINGUAL_TABLET | SUBLINGUAL | Status: DC | PRN

## 2015-07-01 MED ORDER — BISACODYL 10 MG RE SUPP: 10.0000 mg | Freq: Every day | RECTAL | Status: DC | PRN

## 2015-07-01 MED ORDER — METOPROLOL TARTRATE 1 MG/ML IV SOLN: 2.0000 mg | INTRAVENOUS | Status: DC | PRN

## 2015-07-01 MED ORDER — LABETALOL HCL 5 MG/ML IV SOLN: 10.0000 mg | INTRAVENOUS | Status: DC | PRN

## 2015-07-01 MED ORDER — POLYETHYLENE GLYCOL 3350 17 G PO PACK
17.0000 g | PACK | Freq: Every day | ORAL | Status: DC | PRN
  Administered 2015-07-07: 17 g via ORAL
  Filled 2015-07-01 (×2): qty 1

## 2015-07-01 MED ORDER — SENNA 8.6 MG PO TABS
1.0000 | ORAL_TABLET | Freq: Two times a day (BID) | ORAL | Status: DC
  Administered 2015-07-02 – 2015-07-08 (×10): 8.6 mg via ORAL
  Filled 2015-07-01 (×12): qty 1

## 2015-07-01 MED ORDER — ACETAMINOPHEN 325 MG RE SUPP
325.0000 mg | RECTAL | Status: DC | PRN
  Filled 2015-07-01: qty 2

## 2015-07-01 MED ORDER — OXYCODONE-ACETAMINOPHEN 5-325 MG PO TABS
1.0000 | ORAL_TABLET | ORAL | Status: DC | PRN
  Administered 2015-07-01 – 2015-07-08 (×20): 2 via ORAL
  Filled 2015-07-01 (×18): qty 2

## 2015-07-01 MED ORDER — FLUOXETINE HCL 20 MG PO CAPS
20.0000 mg | ORAL_CAPSULE | Freq: Every day | ORAL | Status: DC
  Administered 2015-07-01 – 2015-07-08 (×7): 20 mg via ORAL
  Filled 2015-07-01 (×7): qty 1

## 2015-07-01 MED ORDER — ENOXAPARIN SODIUM 40 MG/0.4ML ~~LOC~~ SOLN: 40.0000 mg | SUBCUTANEOUS | Status: DC

## 2015-07-01 MED ORDER — ONDANSETRON HCL 4 MG/2ML IJ SOLN: 4.0000 mg | Freq: Four times a day (QID) | INTRAMUSCULAR | Status: DC | PRN

## 2015-07-01 MED ORDER — ALUM & MAG HYDROXIDE-SIMETH 200-200-20 MG/5ML PO SUSP: 15.0000 mL | ORAL | Status: DC | PRN

## 2015-07-01 MED ORDER — GUAIFENESIN-DM 100-10 MG/5ML PO SYRP: 15.0000 mL | ORAL_SOLUTION | ORAL | Status: DC | PRN

## 2015-07-01 MED ORDER — HEPARIN (PORCINE) IN NACL 100-0.45 UNIT/ML-% IJ SOLN
950.0000 [IU]/h | INTRAMUSCULAR | Status: DC
  Administered 2015-07-01: 800 [IU]/h via INTRAVENOUS
  Administered 2015-07-02 – 2015-07-03 (×2): 950 [IU]/h via INTRAVENOUS
  Filled 2015-07-01 (×4): qty 250

## 2015-07-01 MED ORDER — PHENOL 1.4 % MT LIQD
1.0000 | OROMUCOSAL | Status: DC | PRN
Start: 2015-07-01 — End: 2015-07-08

## 2015-07-01 MED ORDER — PANTOPRAZOLE SODIUM 40 MG PO TBEC
40.0000 mg | DELAYED_RELEASE_TABLET | Freq: Every day | ORAL | Status: DC
  Administered 2015-07-01 – 2015-07-08 (×7): 40 mg via ORAL
  Filled 2015-07-01 (×7): qty 1

## 2015-07-01 NOTE — Progress Notes (Signed)
   Daily Progress Note  I have reviewed the CTA.  The right fem-pop bypass has occluded.  Laboratory: CBC:    Component Value Date/Time   WBC 7.4 06/30/2015 2115   WBC 6.7 06/22/2011 1506   RBC 4.59 06/30/2015 2115   RBC 4.77 06/22/2011 1506   HGB 14.7 06/30/2015 2115   HGB 16.2 06/22/2011 1506   HCT 44.2 06/30/2015 2115   HCT 46.3 06/22/2011 1506   PLT 188 06/30/2015 2115   PLT 192 06/22/2011 1506   MCV 96.3 06/30/2015 2115   MCV 97 06/22/2011 1506   MCH 32.0 06/30/2015 2115   MCH 34.0 06/22/2011 1506   MCHC 33.3 06/30/2015 2115   MCHC 34.9 06/22/2011 1506   RDW 13.1 06/30/2015 2115   RDW 12.4 06/22/2011 1506   LYMPHSABS 3.0 06/30/2015 2115   MONOABS 0.4 06/30/2015 2115   EOSABS 0.3 06/30/2015 2115   BASOSABS 0.0 06/30/2015 2115    BMP:    Component Value Date/Time   NA 140 06/30/2015 2115   NA 143 06/22/2011 1506   K 3.9 06/30/2015 2115   K 3.9 06/22/2011 1506   CL 102 06/30/2015 2115   CL 106 06/22/2011 1506   CO2 28 06/30/2015 2115   CO2 27 06/22/2011 1506   GLUCOSE 77 06/30/2015 2115   GLUCOSE 84 06/22/2011 1506   BUN 10 06/30/2015 2115   BUN 16 06/22/2011 1506   CREATININE 1.02 06/30/2015 2115   CREATININE 1.11 06/22/2011 1506   CALCIUM 9.4 06/30/2015 2115   CALCIUM 9.1 06/22/2011 1506   GFRNONAA >60 06/30/2015 2115   GFRNONAA >60 06/22/2011 1506   GFRAA >60 06/30/2015 2115   GFRAA >60 06/22/2011 1506    Coagulation: Lab Results  Component Value Date   INR 1.42 06/30/2015   INR 1.02 05/26/2015   INR 1.12 05/05/2015   No results found for: PTT  Lipids: No results found for: CHOL, TRIG, HDL, CHOLHDL, VLDL, LDLCALC, LDLDIRECT  - Admit - Start IV heparin in AM - Preop for Monday, possible R CFA to BK pop bypass  Leonides SakeBrian Michal Strzelecki, MD [facility] [place] Office: [phone] Pager: [phone]  07/01/2015, 1:13 AM

## 2015-07-01 NOTE — Progress Notes (Signed)
Patient received from 5w. Alert and oriented no complaints at this time.

## 2015-07-01 NOTE — Progress Notes (Signed)
Pt. Transferred to [facility], report given to nurse prior to transport.

## 2015-07-01 NOTE — Progress Notes (Signed)
ANTICOAGULATION CONSULT NOTE - Initial Consult  Pharmacy Consult for Heparin (while holding Xarelto) Indication: Hx PE  Allergies  Allergen Reactions  . Lisinopril Other (See Comments)    "dropped my blood pressure too low"     Vital Signs: Temp: 97.9 F (36.6 C) (03/16 1917) Temp Source: Oral (03/16 1917) BP: 135/89 mmHg (03/17 0030) Pulse Rate: 60 (03/17 0030)  Labs:  Recent Labs  06/30/15 2115  HGB 14.7  HCT 44.2  PLT 188  APTT 36  LABPROT 17.4*  INR 1.42  CREATININE 1.02    CrCl cannot be calculated (Unknown ideal weight.).   Medical History: Past Medical History  Diagnosis Date  . Pneumothorax 2006    s/p chest tube placement x 2   . Emphysema   . DVT (deep venous thrombosis) (HCC)     right leg-takes Xarelto daily bridging with Lovenox for surgery  . Coronary artery disease   . Peripheral vascular disease (HCC)   . Myocardial infarction (HCC) 2012  . Pulmonary embolism (HCC)   . Emphysema lung (HCC)   . Shortness of breath dyspnea     with exertion  . History of bronchitis   . Vertigo     rarely but doesn't take any meds  . Peripheral neuropathy (HCC)     takes Gabapentin daily  . History of gout     not on any meds  . Depression     not on any meds  . Collagen vascular disease [facility](HCC)    Assessment: 45 y/o M on Xarelto PTA for hx PE, pt now presents with an occluded fem-pop bypass, holding Xarelto and starting heparin in anticipation of procedure on Monday. CBC good, renal function good, other labs reviewed.   Last dose of Xarelto was at 1600 on 3/16  Goal of Therapy:  Heparin level 0.3-0.7 units/ml aPTT 66-102 seconds Monitor platelets by anticoagulation protocol: Yes   Plan:  -Getting baseline heparin level with AM labs -Start heparin drip at 800 units/hr at 1600 today -2200 aPTT/HL (if baseline heparin level is elevated, if not, can use HL alone) -Daily CBC/HL/aPTT -Monitor for bleeding  Abran DukeLedford, Riann Oman 07/01/2015,1:32 AM

## 2015-07-01 NOTE — Progress Notes (Signed)
UR COMPLETED  

## 2015-07-01 NOTE — Progress Notes (Signed)
Patient right foot very faint pedal pulse with doppler. Dr. Philip Aspenickenson called and he was made aware.

## 2015-07-01 NOTE — Care Management Note (Signed)
Case Management Note  Patient Details  Name: Edward [name] MRN: [id number] Date of Birth: 09-Jun-1970  Subjective/Objective:                 Admitted with  failed R SFA to BK pop bypass   From home wife. Independent with ADL's. WUJ:WJXBJYNWME:crutches.PCP: Terie PurserSamantha Jackson.  Action/Plan:  IV heparin, surgery on Monday 07/04/2015 Return to home when medically stable.CM to f/u with d/c needs.  Expected Discharge Date:                  Expected Discharge Plan:  Home/Self Care  In-House Referral:     Discharge planning Services  CM Consult  Post Acute Care Choice:    Choice offered to:     DME Arranged:    DME Agency:     HH Arranged:    HH Agency:     Status of Service:  In process, will continue to follow  Medicare Important Message Given:    Date Medicare IM Given:    Medicare IM give by:    Date Additional Medicare IM Given:    Additional Medicare Important Message give by:     If discussed at Long Length of Stay Meetings, dates discussed:    Additional Comments:  Epifanio LeschesCole, Remmy Crass Hudson, [place]RN,BSN,CM [phone] 07/01/2015, 6:06 PM

## 2015-07-01 NOTE — ED Provider Notes (Signed)
Pt signed out to me from Dr. Lynelle DoctorKnapp, who stated that the patient had presented with a cold foot and we were awaiting CTA results to evaluate for vascular occlusion. I received a phone call from Dr. Imogene Burnhen, who had reviewed the imaging and stated that he does have an occlusion. He will admit the patient for surgical management. Updated family on plan.  Laurence Spatesachel Morgan Kale Dols, MD 07/01/15 [phone]

## 2015-07-01 NOTE — ED Notes (Signed)
Breakfast Tray ordered @ [id number].

## 2015-07-02 ENCOUNTER — Inpatient Hospital Stay (HOSPITAL_COMMUNITY): Payer: PRIVATE HEALTH INSURANCE

## 2015-07-02 DIAGNOSIS — Z01810 Encounter for preprocedural cardiovascular examination: Secondary | ICD-10-CM

## 2015-07-02 LAB — CBC
HCT: 39.4 % (ref 39.0–52.0)
Hemoglobin: 12.7 g/dL — ABNORMAL LOW (ref 13.0–17.0)
MCH: 31.4 pg (ref 26.0–34.0)
MCHC: 32.2 g/dL (ref 30.0–36.0)
MCV: 97.3 fL (ref 78.0–100.0)
Platelets: 151 10*3/uL (ref 150–400)
RBC: 4.05 MIL/uL — ABNORMAL LOW (ref 4.22–5.81)
RDW: 13.1 % (ref 11.5–15.5)
WBC: 4.6 10*3/uL (ref 4.0–10.5)

## 2015-07-02 LAB — APTT
aPTT: 56 seconds — ABNORMAL HIGH (ref 24–37)
aPTT: 95 seconds — ABNORMAL HIGH (ref 24–37)

## 2015-07-02 LAB — HEPARIN LEVEL (UNFRACTIONATED)
Heparin Unfractionated: 0.65 IU/mL (ref 0.30–0.70)
Heparin Unfractionated: 0.53 IU/mL (ref 0.30–0.70)

## 2015-07-02 MED ORDER — DIPHENHYDRAMINE HCL 25 MG PO CAPS
50.0000 mg | ORAL_CAPSULE | Freq: Every evening | ORAL | Status: DC | PRN
  Administered 2015-07-03 – 2015-07-06 (×2): 50 mg via ORAL
  Filled 2015-07-02 (×3): qty 2

## 2015-07-02 NOTE — Progress Notes (Signed)
VASCULAR LAB PRELIMINARY  ARTERIAL  ABI completed:    RIGHT    LEFT    PRESSURE WAVEFORM  PRESSURE WAVEFORM  BRACHIAL 118 Triphasic BRACHIAL 117  Triphasic  DP Barely audible  DP 129 Triphasic  AT 50 Dampened Monophasic AT    PT 49 Dampened Monophasic PT 132 Triphasic    RIGHT LEFT  ABI 0.42 1.12   ABIs and Doppler waveforms indicate a severe reduction in arterila flow on the right and normal flow on the left at rest  Edward [name], RVS 07/02/2015, 9:06 AM

## 2015-07-02 NOTE — Progress Notes (Signed)
ANTICOAGULATION CONSULT NOTE - Follow Up Consult  Pharmacy Consult for Heparin Indication: occluded fem-pop BP  Allergies  Allergen Reactions  . Lisinopril Other (See Comments)    "dropped my blood pressure too low"    Patient Measurements: Height: 5\' 6"  (167.6 cm) Weight: 117 lb 3.2 oz (53.162 kg) IBW/kg (Calculated) : 63.8 Heparin Dosing Weight: 53.2 kg  Vital Signs: Temp: 97.8 F (36.6 C) (03/18 0310) Temp Source: Oral (03/18 0310) BP: 140/81 mmHg (03/18 0310) Pulse Rate: 66 (03/18 0310)  Labs:  Recent Labs  06/30/15 2115 07/01/15 0317 07/02/15 0005 07/02/15 0334 07/02/15 1048  HGB 14.7  --   --  12.7*  --   HCT 44.2  --   --  39.4  --   PLT 188  --   --  151  --   APTT 36  --  56*  --  95*  LABPROT 17.4*  --   --   --   --   INR 1.42  --   --   --   --   HEPARINUNFRC  --  1.16*  --   --  0.65  CREATININE 1.02  --   --   --   --     Estimated Creatinine Clearance: 71 mL/min (by C-G formula based on Cr of 1.02).   Assessment: 45 y/o M on Xarelto PTA for hx PE, pt now presents with an occluded R fem-pop bypass, holding Xarelto and starting heparin in anticipation of procedure on Monday. CBC good, renal function good, other labs reviewed.   AC: Xarelto PTA (LD 1600 on 3/16) for hx PE, holding and starting heparin in anticipation of red-do bypass on Monday for occluded R fem-pop BP.  Baseline HL elevated, follow aPTTs until correlating. HL 0.65 and aPTT95 both in goal range. Hgb 14.7>12.7.  Goal of Therapy:  Heparin level 0.3-0.7 units/ml  APTT 66-102 Monitor platelets by anticoagulation protocol: Yes   Plan:  Continue IV heparin at 950 units/hr--recheck in 6 hrs. Daily HL and CBC, d/c daily aPTTs   Val Farnam S. Merilynn Finlandobertson, PharmD, BCPS Clinical Staff Pharmacist Pager [phone]  Misty Stanleyobertson, Llewellyn Schoenberger Stillinger 07/02/2015,12:35 PM

## 2015-07-02 NOTE — Progress Notes (Signed)
ANTICOAGULATION CONSULT NOTE - Follow Up Consult  Pharmacy Consult for Heparin (while holding Xarelto) Indication: Hx PE  Allergies  Allergen Reactions  . Lisinopril Other (See Comments)    "dropped my blood pressure too low"   Patient Measurements: Height: 5\' 6"  (167.6 cm) Weight: 117 lb 3.2 oz (53.162 kg) IBW/kg (Calculated) : 63.8  Vital Signs: Temp: 97.8 F (36.6 C) (03/17 1935) Temp Source: Oral (03/17 1935) BP: 125/68 mmHg (03/17 1935) Pulse Rate: 71 (03/17 1935)  Labs:  Recent Labs  06/30/15 2115 07/01/15 0317 07/02/15 0005  HGB 14.7  --   --   HCT 44.2  --   --   PLT 188  --   --   APTT 36  --  56*  LABPROT 17.4*  --   --   INR 1.42  --   --   HEPARINUNFRC  --  1.16*  --   CREATININE 1.02  --   --     Estimated Creatinine Clearance: 71 mL/min (by C-G formula based on Cr of 1.02).   Assessment: Holding Xarelto and using heparin while awaiting procedure, initial aPTT is low at 56, using aPTT for now given xarelto influence on anti-Xa levels.   Goal of Therapy:  Heparin level 0.3-0.7 units/ml aPTT 66-102 seconds Monitor platelets by anticoagulation protocol: Yes   Plan:  -Increase heparin to 950 units/hr -0900 aPTT/HL -Daily CBC/HL/aPTT -Monitor for bleeding  Abran DukeLedford, Taunya Goral 07/02/2015,12:59 AM

## 2015-07-02 NOTE — Progress Notes (Signed)
VASCULAR LAB PRELIMINARY  PRELIMINARY  PRELIMINARY  PRELIMINARY  Right Lower Extremity Vein Map    Right Great Saphenous Vein   Segment Diameter / Depth Comment  1. Origin mm Appears thrombosed  2. High Thigh mm Not visualized  3. Mid Thigh mm Not visualized  4. Low Thigh mm Not visualized  5. At Knee mm Not visualized  6. High Calf mm Appears Thrombosed  7. Low Calf 2.5 / 2.1 mm Multiple branches  8. Ankle 3.7 / 3.2 mm Multiple branches    Right Small Saphenous Vein  Segment Diameter Comment  1. Origin 4.3 / 4.201mm   3. Low Calf 3.9 / 4.561mm   4. Ankle 4.9 / 4.430mm     Left Lower Extremity Vein Map    Left Great Saphenous Vein   Segment Diameter / Depth Comment  1. Origin 3.3 / 6.2 mm   2. High Thigh 3.5 / 3.8 mm   3. Mid Thigh 3.0 / 5.4 mm   4. Low Thigh 3.5 / 4.1 mm   5. At Knee 3.1 / 1.9 mm   6. High Calf 3.4 / 2.2 mm   7. Low Calf 2.4 / 2.7 mm   8. Ankle 2.3 / 1.6 mm     Hoyle Barkdull, RVS 07/02/2015, 12:26 PM

## 2015-07-02 NOTE — Progress Notes (Addendum)
  [facility] Progress Note  Subjective    Right big toe and foot hurting.   Objective Filed Vitals:   07/01/15 1935 07/02/15 0310  BP: 125/68 140/81  Pulse: 71 66  Temp: 97.8 F (36.6 C) 97.8 F (36.6 C)  Resp: 16 18    Intake/Output Summary (Last 24 hours) at 07/02/15 0814 Last data filed at 07/02/15 0249  Gross per 24 hour  Intake 2969.85 ml  Output    825 ml  Net 2144.85 ml   Monophasic right PT doppler flow.  Cyanosis to right great toe  Foot is mildly warm.   Assessment/Planning: 45 y.o. male with occluded right fem-pop bypass  Faint doppler flow to right PT Advised dangling of foot on side of bed and ambulating to help with blood flow to foot. Discussed with day RN that he may ambulate in halls.  Hx of PE: Xarelto held, on heparin gtt  Vein mapping pending. Called vascular lab.  Counseled on smoking cessation.  Plan for possible new right leg bypass Monday with Dr. Darrick PennaFields.   Raymond GurneyKimberly A Trinh 07/02/2015 8:14 AM --  Laboratory CBC    Component Value Date/Time   WBC 4.6 07/02/2015 0334   WBC 6.7 06/22/2011 1506   HGB 12.7* 07/02/2015 0334   HGB 16.2 06/22/2011 1506   HCT 39.4 07/02/2015 0334   HCT 46.3 06/22/2011 1506   PLT 151 07/02/2015 0334   PLT 192 06/22/2011 1506    BMET    Component Value Date/Time   NA 140 06/30/2015 2115   NA 143 06/22/2011 1506   K 3.9 06/30/2015 2115   K 3.9 06/22/2011 1506   CL 102 06/30/2015 2115   CL 106 06/22/2011 1506   CO2 28 06/30/2015 2115   CO2 27 06/22/2011 1506   GLUCOSE 77 06/30/2015 2115   GLUCOSE 84 06/22/2011 1506   BUN 10 06/30/2015 2115   BUN 16 06/22/2011 1506   CREATININE 1.02 06/30/2015 2115   CREATININE 1.11 06/22/2011 1506   CALCIUM 9.4 06/30/2015 2115   CALCIUM 9.1 06/22/2011 1506   GFRNONAA >60 06/30/2015 2115   GFRNONAA >60 06/22/2011 1506   GFRAA >60 06/30/2015 2115   GFRAA >60 06/22/2011 1506    COAG Lab Results  Component Value Date   INR 1.42 06/30/2015   INR 1.02 05/26/2015   INR 1.12 05/05/2015   No results found for: PTT  Antibiotics Anti-infectives    None     Maris BergerKimberly Trinh, PA-C [facility] Office: [phone] Pager: [phone] 07/02/2015 8:14 AM  Agree with above. Vein map is pending. We have had a long discussion about the importance of tobacco cessation. I have explained that his foot will feel better when it is dependent and not elevated. I have also explained that if the symptoms were tolerably option of not doing redo surgery is an option. However currently he feels his symptoms are significant and therefore as outlined by Dr. Leonides SakeBrian Chen, the plan is for a redo bypass by Dr. Darrick PennaFields on Monday.  Waverly Ferrarihristopher Dickson, MD, FACS Beeper [phone]

## 2015-07-02 NOTE — Progress Notes (Signed)
ANTICOAGULATION CONSULT NOTE - Follow Up Consult  Pharmacy Consult for Heparin Indication: occluded fem-pop BP  Allergies  Allergen Reactions  . Lisinopril Other (See Comments)    "dropped my blood pressure too low"    Patient Measurements: Height: 5\' 6"  (167.6 cm) Weight: 117 lb 3.2 oz (53.162 kg) IBW/kg (Calculated) : 63.8 Heparin Dosing Weight: 53.2 kg  Vital Signs: Temp: 98.1 F (36.7 C) (03/18 1303) Temp Source: Oral (03/18 1303) BP: 137/78 mmHg (03/18 1303) Pulse Rate: 67 (03/18 1303)  Labs:  Recent Labs  06/30/15 2115 07/01/15 0317 07/02/15 0005 07/02/15 0334 07/02/15 1048 07/02/15 1633  HGB 14.7  --   --  12.7*  --   --   HCT 44.2  --   --  39.4  --   --   PLT 188  --   --  151  --   --   APTT 36  --  56*  --  95*  --   LABPROT 17.4*  --   --   --   --   --   INR 1.42  --   --   --   --   --   HEPARINUNFRC  --  1.16*  --   --  0.65 0.53  CREATININE 1.02  --   --   --   --   --     Estimated Creatinine Clearance: 71 mL/min (by C-G formula based on Cr of 1.02).   Assessment: 45 y/o M on Xarelto PTA for hx PE, pt now presents with an occluded R fem-pop bypass, holding Xarelto and starting heparin in anticipation of procedure on Monday. CBC good, renal function good, other labs reviewed.   AC: Xarelto PTA (LD 1600 on 3/16) for hx PE, holding and starting heparin in anticipation of red-do bypass on Monday for occluded R fem-pop BP.  Baseline HL elevated, follow aPTTs until correlating.  HL therapeutic x 2  Goal of Therapy:  Heparin level 0.3-0.7 units/ml  Monitor platelets by anticoagulation protocol: Yes   Plan:  Continue IV heparin at 950 units/h Daily HL and CBC  Isaac BlissMichael Chevella Pearce, PharmD, BCPS, [facility] Clinical Pharmacist Pager [phone] 07/02/2015 5:27 PM

## 2015-07-03 LAB — HEPARIN LEVEL (UNFRACTIONATED): Heparin Unfractionated: 0.69 IU/mL (ref 0.30–0.70)

## 2015-07-03 LAB — CBC
HCT: 38 % — ABNORMAL LOW (ref 39.0–52.0)
Hemoglobin: 13 g/dL (ref 13.0–17.0)
MCH: 33.1 pg (ref 26.0–34.0)
MCHC: 34.2 g/dL (ref 30.0–36.0)
MCV: 96.7 fL (ref 78.0–100.0)
Platelets: 183 10*3/uL (ref 150–400)
RBC: 3.93 MIL/uL — ABNORMAL LOW (ref 4.22–5.81)
RDW: 13 % (ref 11.5–15.5)
WBC: 4.9 10*3/uL (ref 4.0–10.5)

## 2015-07-03 MED ORDER — DEXTROSE 5 % IV SOLN
1.5000 g | INTRAVENOUS | Status: AC
  Administered 2015-07-04 (×2): 1.5 g via INTRAVENOUS
  Filled 2015-07-03: qty 1.5

## 2015-07-03 NOTE — Progress Notes (Signed)
ANTICOAGULATION CONSULT NOTE - Follow Up Consult  Pharmacy Consult for Heparin Indication: occluded fem-pop BP  Allergies  Allergen Reactions  . Lisinopril Other (See Comments)    "dropped my blood pressure too low"    Patient Measurements: Height: 5\' 6"  (167.6 cm) Weight: 117 lb 3.2 oz (53.162 kg) IBW/kg (Calculated) : 63.8 Heparin Dosing Weight: 53.2 kg  Vital Signs: Temp: 98 F (36.7 C) (03/19 0522) Temp Source: Oral (03/19 0522) BP: 148/85 mmHg (03/19 0522) Pulse Rate: 58 (03/19 0522)  Labs:  Recent Labs  06/30/15 2115  07/02/15 0005 07/02/15 0334 07/02/15 1048 07/02/15 1633 07/03/15 0427  HGB 14.7  --   --  12.7*  --   --  13.0  HCT 44.2  --   --  39.4  --   --  38.0*  PLT 188  --   --  151  --   --  183  APTT 36  --  56*  --  95*  --   --   LABPROT 17.4*  --   --   --   --   --   --   INR 1.42  --   --   --   --   --   --   HEPARINUNFRC  --   < >  --   --  0.65 0.53 0.69  CREATININE 1.02  --   --   --   --   --   --   < > = values in this interval not displayed.  Estimated Creatinine Clearance: 71 mL/min (by C-G formula based on Cr of 1.02).   Assessment: 45 y/o M on Xarelto PTA for hx PE, pt now presents with an occluded R fem-pop bypass, holding Xarelto and starting heparin in anticipation of procedure on Monday. CBC good, renal function good, other labs reviewed.   AC: Xarelto PTA (LD 1600 on 3/16) for hx PE, holding and starting heparin in anticipation of red-do bypass on Monday for occluded R fem-pop BP.  Baseline HL elevated, follow aPTTs until correlating. HL 0.69 in goal, Hgb 13 ok.  Goal of Therapy:  Heparin level 0.3-0.7 units/ml  APTT 66-102 Monitor platelets by anticoagulation protocol: Yes   Plan:  Izell Carolinaonti- nue IV heparin at 950 units/hr, stop at 0500 in AM for surgery - Will still get AM levesl on 3/20. Heparin should be resumed post-op for h/o PE. - Daily HL and CBC, d/c daily aPTTs  Hadlee Burback S. Merilynn Finlandobertson, PharmD, [facility]tonBCPS Clinical Staff  Pharmacist Pager [phone]  Misty Stanleyobertson, Tige Meas Stillinger 07/03/2015,10:36 AM

## 2015-07-03 NOTE — Progress Notes (Addendum)
  [facility] Progress Note  VASCULAR SURGERY ASSESSMENT & PLAN:  * Plan is for redo bypass right leg potentially using GSV from left leg tomorrow by Dr. Fields.   * On heparin.  * We have discussed a non-operative approach, but he feels that he cannot tolerate his symptoms.  Edward Dickson, MD, FACS Beeper [phone]  Subjective    Pain with right big toe and foot. Walked some yesterday.   Objective Filed Vitals:   07/02/15 2026 07/03/15 0522  BP: 151/87 148/85  Pulse: 70 58  Temp: 97.8 F (36.6 C) 98 F (36.7 C)  Resp:  18    Intake/Output Summary (Last 24 hours) at 07/03/15 0838 Last data filed at 07/03/15 0522  Gross per 24 hour  Intake    120 ml  Output   2000 ml  Net  -1880 ml   Monophasic doppler flow right PT Cyanotic changes to right great toe.   ABIs 07/02/15          PRESSURE WAVEFORM  PRESSURE WAVEFORM  BRACHIAL 118 Triphasic BRACHIAL 117  Triphasic  DP Barely audible  DP 129 Triphasic  AT 50 Dampened Monophasic AT    PT 49 Dampened Monophasic PT 132 Triphasic    RIGHT LEFT  ABI 0.42 1.12        Assessment/Planning: 45 y.o. male with occluded right fem-pop bypass   Patient feels that his symptoms are intolerable and would like to proceed with redo bypass Vein shows adequate right SSV and left GSV vein conduits. Patient is agreeable to possible vein harvest from contralateral leg.  Discussed smoking cessation with patient.  Plan for redo right leg bypass tomorrow with Dr. Fields.   Kimberly A Trinh 07/03/2015 8:38 AM --  Laboratory CBC    Component Value Date/Time   WBC 4.9 07/03/2015 0427   WBC 6.7 06/22/2011 1506   HGB 13.0 07/03/2015 0427   HGB 16.2 06/22/2011 1506   HCT 38.0* 07/03/2015 0427   HCT 46.3 06/22/2011 1506   PLT 183 07/03/2015 0427   PLT 192 06/22/2011 1506    BMET    Component Value Date/Time   NA 140 06/30/2015 2115   NA 143 06/22/2011 1506   K  3.9 06/30/2015 2115   K 3.9 06/22/2011 1506   CL 102 06/30/2015 2115   CL 106 06/22/2011 1506   CO2 28 06/30/2015 2115   CO2 27 06/22/2011 1506   GLUCOSE 77 06/30/2015 2115   GLUCOSE 84 06/22/2011 1506   BUN 10 06/30/2015 2115   BUN 16 06/22/2011 1506   CREATININE 1.02 06/30/2015 2115   CREATININE 1.11 06/22/2011 1506   CALCIUM 9.4 06/30/2015 2115   CALCIUM 9.1 06/22/2011 1506   GFRNONAA >60 06/30/2015 2115   GFRNONAA >60 06/22/2011 1506   GFRAA >60 06/30/2015 2115   GFRAA >60 06/22/2011 1506    COAG Lab Results  Component Value Date   INR 1.42 06/30/2015   INR 1.02 05/26/2015   INR 1.12 05/05/2015   No results found for: PTT  Antibiotics Anti-infectives    None       Kimberly Trinh, PA-C [facility] Office: [phone] Pager: [phone] 07/03/2015 8:38 AM      

## 2015-07-04 ENCOUNTER — Inpatient Hospital Stay (HOSPITAL_COMMUNITY): Payer: PRIVATE HEALTH INSURANCE | Admitting: Anesthesiology

## 2015-07-04 ENCOUNTER — Encounter (HOSPITAL_COMMUNITY): Payer: Self-pay | Admitting: Anesthesiology

## 2015-07-04 ENCOUNTER — Inpatient Hospital Stay (HOSPITAL_COMMUNITY): Payer: PRIVATE HEALTH INSURANCE

## 2015-07-04 ENCOUNTER — Encounter (HOSPITAL_COMMUNITY)
Admission: EM | Disposition: A | Discharge status: Home Health | Payer: Self-pay | Source: Home / Self Care | Attending: Vascular Surgery

## 2015-07-04 ENCOUNTER — Other Ambulatory Visit: Payer: Self-pay | Admitting: *Deleted

## 2015-07-04 DIAGNOSIS — R4781 Slurred speech: Secondary | ICD-10-CM

## 2015-07-04 DIAGNOSIS — I70221 Atherosclerosis of native arteries of extremities with rest pain, right leg: Secondary | ICD-10-CM

## 2015-07-04 DIAGNOSIS — Z0279 Encounter for issue of other medical certificate: Secondary | ICD-10-CM

## 2015-07-04 DIAGNOSIS — I739 Peripheral vascular disease, unspecified: Secondary | ICD-10-CM | POA: Diagnosis present

## 2015-07-04 HISTORY — PX: INTRAOPERATIVE ARTERIOGRAM: SHX5157

## 2015-07-04 HISTORY — PX: THROMBECTOMY FEMORAL ARTERY: SHX6406

## 2015-07-04 HISTORY — PX: FEMORAL-POPLITEAL BYPASS GRAFT: SHX937

## 2015-07-04 HISTORY — PX: VEIN HARVEST: SHX6363

## 2015-07-04 LAB — BASIC METABOLIC PANEL
Anion gap: 6 (ref 5–15)
BUN: 6 mg/dL (ref 6–20)
CO2: 28 mmol/L (ref 22–32)
Calcium: 8.4 mg/dL — ABNORMAL LOW (ref 8.9–10.3)
Chloride: 103 mmol/L (ref 101–111)
Creatinine, Ser: 1.04 mg/dL (ref 0.61–1.24)
GFR calc Af Amer: >60 mL/min (ref >60)
GFR calc non Af Amer: >60 mL/min (ref >60)
Glucose, Bld: 98 mg/dL (ref 65–99)
Potassium: 4.4 mmol/L (ref 3.5–5.1)
Sodium: 137 mmol/L (ref 135–145)

## 2015-07-04 LAB — CBC
HCT: 38.6 % — ABNORMAL LOW (ref 39.0–52.0)
Hemoglobin: 12.7 g/dL — ABNORMAL LOW (ref 13.0–17.0)
MCH: 31.3 pg (ref 26.0–34.0)
MCHC: 32.9 g/dL (ref 30.0–36.0)
MCV: 95.1 fL (ref 78.0–100.0)
Platelets: 137 10*3/uL — ABNORMAL LOW (ref 150–400)
RBC: 4.06 MIL/uL — ABNORMAL LOW (ref 4.22–5.81)
RDW: 12.7 % (ref 11.5–15.5)
WBC: 4.9 10*3/uL (ref 4.0–10.5)

## 2015-07-04 LAB — HEPARIN LEVEL (UNFRACTIONATED): Heparin Unfractionated: 0.51 IU/mL (ref 0.30–0.70)

## 2015-07-04 SURGERY — BYPASS GRAFT FEMORAL-POPLITEAL ARTERY
Anesthesia: General | Site: Leg Lower | Laterality: Right

## 2015-07-04 MED ORDER — OXYCODONE-ACETAMINOPHEN 5-325 MG PO TABS
ORAL_TABLET | ORAL | Status: AC
  Administered 2015-07-04: 2 via ORAL
  Filled 2015-07-04: qty 2

## 2015-07-04 MED ORDER — SODIUM CHLORIDE 0.9 % IV SOLN
10.0000 mg | INTRAVENOUS | Status: DC | PRN
  Administered 2015-07-04: 15 ug/min via INTRAVENOUS

## 2015-07-04 MED ORDER — PROTAMINE SULFATE 10 MG/ML IV SOLN
INTRAVENOUS | Status: AC
  Filled 2015-07-04: qty 5

## 2015-07-04 MED ORDER — MORPHINE SULFATE (PF) 2 MG/ML IV SOLN
2.0000 mg | INTRAVENOUS | Status: DC | PRN
  Administered 2015-07-04 – 2015-07-05 (×4): 2 mg via INTRAVENOUS
  Filled 2015-07-04 (×3): qty 1

## 2015-07-04 MED ORDER — ARTIFICIAL TEARS OP OINT
TOPICAL_OINTMENT | OPHTHALMIC | Status: DC | PRN
  Administered 2015-07-04: 1 via OPHTHALMIC

## 2015-07-04 MED ORDER — METOPROLOL TARTRATE 1 MG/ML IV SOLN: 2.0000 mg | INTRAVENOUS | Status: DC | PRN

## 2015-07-04 MED ORDER — OXYCODONE-ACETAMINOPHEN 5-325 MG PO TABS
ORAL_TABLET | ORAL | Status: AC
  Filled 2015-07-04: qty 2

## 2015-07-04 MED ORDER — HYDROMORPHONE HCL 1 MG/ML IJ SOLN
INTRAMUSCULAR | Status: AC
  Administered 2015-07-04: 0.5 mg via INTRAVENOUS
  Filled 2015-07-04: qty 1

## 2015-07-04 MED ORDER — GLYCOPYRROLATE 0.2 MG/ML IJ SOLN
INTRAMUSCULAR | Status: DC | PRN
  Administered 2015-07-04: .2 mg via INTRAVENOUS

## 2015-07-04 MED ORDER — SODIUM CHLORIDE 0.9 % IV SOLN
INTRAVENOUS | Status: DC | PRN
  Administered 2015-07-04: 09:00:00

## 2015-07-04 MED ORDER — LABETALOL HCL 5 MG/ML IV SOLN: 10.0000 mg | INTRAVENOUS | Status: DC | PRN

## 2015-07-04 MED ORDER — HYDROMORPHONE HCL 1 MG/ML IJ SOLN
0.5000 mg | INTRAMUSCULAR | Status: AC | PRN
  Administered 2015-07-04 (×2): 0.5 mg via INTRAVENOUS

## 2015-07-04 MED ORDER — MORPHINE SULFATE (PF) 2 MG/ML IV SOLN
INTRAVENOUS | Status: AC
  Administered 2015-07-04: 2 mg via INTRAVENOUS
  Filled 2015-07-04: qty 1

## 2015-07-04 MED ORDER — LIDOCAINE HCL (CARDIAC) 20 MG/ML IV SOLN
INTRAVENOUS | Status: AC
  Filled 2015-07-04: qty 15

## 2015-07-04 MED ORDER — GUAIFENESIN-DM 100-10 MG/5ML PO SYRP: 15.0000 mL | ORAL_SOLUTION | ORAL | Status: DC | PRN

## 2015-07-04 MED ORDER — SUCCINYLCHOLINE CHLORIDE 20 MG/ML IJ SOLN
INTRAMUSCULAR | Status: AC
  Filled 2015-07-04: qty 2

## 2015-07-04 MED ORDER — ONDANSETRON HCL 4 MG/2ML IJ SOLN
INTRAMUSCULAR | Status: DC | PRN
  Administered 2015-07-04: 4 mg via INTRAVENOUS

## 2015-07-04 MED ORDER — FENTANYL CITRATE (PF) 100 MCG/2ML IJ SOLN
INTRAMUSCULAR | Status: DC | PRN
  Administered 2015-07-04: 50 ug via INTRAVENOUS
  Administered 2015-07-04: 100 ug via INTRAVENOUS
  Administered 2015-07-04 (×5): 50 ug via INTRAVENOUS

## 2015-07-04 MED ORDER — HYDROMORPHONE HCL 1 MG/ML IJ SOLN
INTRAMUSCULAR | Status: AC
  Filled 2015-07-04: qty 1

## 2015-07-04 MED ORDER — ACETAMINOPHEN 650 MG RE SUPP: 325.0000 mg | RECTAL | Status: DC | PRN

## 2015-07-04 MED ORDER — MIDAZOLAM HCL 2 MG/2ML IJ SOLN
INTRAMUSCULAR | Status: AC
  Filled 2015-07-04: qty 2

## 2015-07-04 MED ORDER — FENTANYL CITRATE (PF) 250 MCG/5ML IJ SOLN
INTRAMUSCULAR | Status: AC
  Filled 2015-07-04: qty 5

## 2015-07-04 MED ORDER — HYDRALAZINE HCL 20 MG/ML IJ SOLN: 5.0000 mg | INTRAMUSCULAR | Status: DC | PRN

## 2015-07-04 MED ORDER — ACETAMINOPHEN 10 MG/ML IV SOLN
INTRAVENOUS | Status: DC | PRN
  Administered 2015-07-04: 1000 mg via INTRAVENOUS

## 2015-07-04 MED ORDER — ALUM & MAG HYDROXIDE-SIMETH 200-200-20 MG/5ML PO SUSP: 15.0000 mL | ORAL | Status: DC | PRN

## 2015-07-04 MED ORDER — SODIUM CHLORIDE 0.9 % IV SOLN
INTRAVENOUS | Status: DC
  Administered 2015-07-04: 22:00:00 via INTRAVENOUS

## 2015-07-04 MED ORDER — PROPOFOL 10 MG/ML IV BOLUS
INTRAVENOUS | Status: DC | PRN
  Administered 2015-07-04: 130 mg via INTRAVENOUS
  Administered 2015-07-04: 50 mg via INTRAVENOUS

## 2015-07-04 MED ORDER — ROCURONIUM BROMIDE 100 MG/10ML IV SOLN
INTRAVENOUS | Status: DC | PRN
  Administered 2015-07-04: 50 mg via INTRAVENOUS

## 2015-07-04 MED ORDER — CEFUROXIME SODIUM 1.5 G IJ SOLR
1.5000 g | INTRAMUSCULAR | Status: DC
  Filled 2015-07-04: qty 1.5

## 2015-07-04 MED ORDER — PROMETHAZINE HCL 25 MG/ML IJ SOLN
INTRAMUSCULAR | Status: AC
  Administered 2015-07-04: 6.25 mg via INTRAVENOUS
  Filled 2015-07-04: qty 1

## 2015-07-04 MED ORDER — FENTANYL CITRATE (PF) 100 MCG/2ML IJ SOLN
INTRAMUSCULAR | Status: AC
  Administered 2015-07-04: 50 ug via INTRAVENOUS
  Filled 2015-07-04: qty 2

## 2015-07-04 MED ORDER — EPHEDRINE SULFATE 50 MG/ML IJ SOLN
INTRAMUSCULAR | Status: AC
  Filled 2015-07-04: qty 2

## 2015-07-04 MED ORDER — BISACODYL 10 MG RE SUPP: 10.0000 mg | Freq: Every day | RECTAL | Status: DC | PRN

## 2015-07-04 MED ORDER — IOHEXOL 300 MG/ML  SOLN
INTRAMUSCULAR | Status: DC | PRN
  Administered 2015-07-04: 50 mL via INTRAVENOUS

## 2015-07-04 MED ORDER — ONDANSETRON HCL 4 MG/2ML IJ SOLN: 4.0000 mg | Freq: Four times a day (QID) | INTRAMUSCULAR | Status: DC | PRN

## 2015-07-04 MED ORDER — PROPOFOL 10 MG/ML IV BOLUS
INTRAVENOUS | Status: AC
  Filled 2015-07-04: qty 20

## 2015-07-04 MED ORDER — ARTIFICIAL TEARS OP OINT
TOPICAL_OINTMENT | OPHTHALMIC | Status: AC
  Filled 2015-07-04: qty 7

## 2015-07-04 MED ORDER — ONDANSETRON HCL 4 MG/2ML IJ SOLN
INTRAMUSCULAR | Status: AC
  Filled 2015-07-04: qty 4

## 2015-07-04 MED ORDER — ENOXAPARIN SODIUM 30 MG/0.3ML ~~LOC~~ SOLN
30.0000 mg | SUBCUTANEOUS | Status: DC
  Administered 2015-07-05: 30 mg via SUBCUTANEOUS
  Filled 2015-07-04: qty 0.3

## 2015-07-04 MED ORDER — HEPARIN SODIUM (PORCINE) 1000 UNIT/ML IJ SOLN
INTRAMUSCULAR | Status: DC | PRN
  Administered 2015-07-04: 6000 [IU] via INTRAVENOUS
  Administered 2015-07-04: 3000 [IU] via INTRAVENOUS

## 2015-07-04 MED ORDER — PROMETHAZINE HCL 25 MG/ML IJ SOLN
6.2500 mg | INTRAMUSCULAR | Status: DC | PRN
  Administered 2015-07-04: 6.25 mg via INTRAVENOUS

## 2015-07-04 MED ORDER — DEXTROSE 5 % IV SOLN
1.5000 g | Freq: Two times a day (BID) | INTRAVENOUS | Status: AC
  Administered 2015-07-04 – 2015-07-05 (×2): 1.5 g via INTRAVENOUS
  Filled 2015-07-04 (×3): qty 1.5

## 2015-07-04 MED ORDER — ROCURONIUM BROMIDE 50 MG/5ML IV SOLN
INTRAVENOUS | Status: AC
  Filled 2015-07-04: qty 3

## 2015-07-04 MED ORDER — 0.9 % SODIUM CHLORIDE (POUR BTL) OPTIME
TOPICAL | Status: DC | PRN
  Administered 2015-07-04: 2000 mL

## 2015-07-04 MED ORDER — HEPARIN SODIUM (PORCINE) 1000 UNIT/ML IJ SOLN
INTRAMUSCULAR | Status: AC
  Filled 2015-07-04: qty 1

## 2015-07-04 MED ORDER — SODIUM CHLORIDE 0.9 % IJ SOLN
INTRAMUSCULAR | Status: AC
  Filled 2015-07-04: qty 20

## 2015-07-04 MED ORDER — POTASSIUM CHLORIDE CRYS ER 20 MEQ PO TBCR
20.0000 meq | EXTENDED_RELEASE_TABLET | Freq: Every day | ORAL | Status: DC | PRN
Start: 2015-07-04 — End: 2015-07-08

## 2015-07-04 MED ORDER — FENTANYL CITRATE (PF) 100 MCG/2ML IJ SOLN
INTRAMUSCULAR | Status: AC
  Filled 2015-07-04: qty 2

## 2015-07-04 MED ORDER — DOCUSATE SODIUM 100 MG PO CAPS
100.0000 mg | ORAL_CAPSULE | Freq: Every day | ORAL | Status: DC
  Administered 2015-07-05 – 2015-07-08 (×4): 100 mg via ORAL
  Filled 2015-07-04 (×4): qty 1

## 2015-07-04 MED ORDER — ACETAMINOPHEN 10 MG/ML IV SOLN
INTRAVENOUS | Status: AC
  Filled 2015-07-04: qty 100

## 2015-07-04 MED ORDER — LACTATED RINGERS IV SOLN
INTRAVENOUS | Status: DC | PRN
  Administered 2015-07-04 (×3): via INTRAVENOUS

## 2015-07-04 MED ORDER — ACETAMINOPHEN 325 MG PO TABS: 325.0000 mg | ORAL_TABLET | ORAL | Status: DC | PRN

## 2015-07-04 MED ORDER — SODIUM CHLORIDE 0.9 % IV SOLN: 500.0000 mL | Freq: Once | INTRAVENOUS | Status: DC | PRN

## 2015-07-04 MED ORDER — LIDOCAINE HCL (CARDIAC) 20 MG/ML IV SOLN
INTRAVENOUS | Status: DC | PRN
  Administered 2015-07-04: 100 mg via INTRAVENOUS
  Administered 2015-07-04: 50 mg via INTRAVENOUS

## 2015-07-04 MED ORDER — FENTANYL CITRATE (PF) 100 MCG/2ML IJ SOLN
25.0000 ug | INTRAMUSCULAR | Status: DC | PRN
  Administered 2015-07-04 (×3): 50 ug via INTRAVENOUS

## 2015-07-04 SURGICAL SUPPLY — 60 items
BAG ISOLATION DRAPE 18X18 (DRAPES) ×4 IMPLANT
BANDAGE ESMARK 6X9 LF (GAUZE/BANDAGES/DRESSINGS) IMPLANT
BNDG ESMARK 6X9 LF (GAUZE/BANDAGES/DRESSINGS)
CANISTER SUCTION 2500CC (MISCELLANEOUS) ×6 IMPLANT
CANNULA VESSEL 3MM 2 BLNT TIP (CANNULA) ×12 IMPLANT
CATH EMB 3FR 80CM (CATHETERS) ×6 IMPLANT
CLIP TI MEDIUM 24 (CLIP) ×6 IMPLANT
CLIP TI WIDE RED SMALL 24 (CLIP) ×18 IMPLANT
DRAIN SNY WOU (WOUND CARE) IMPLANT
DRAPE ISOLATION BAG 18X18 (DRAPES) ×2
DRAPE X-RAY CASS 24X20 (DRAPES) ×6 IMPLANT
ELECT REM PT RETURN 9FT ADLT (ELECTROSURGICAL) ×6
ELECTRODE REM PT RTRN 9FT ADLT (ELECTROSURGICAL) ×4 IMPLANT
EVACUATOR SILICONE 100CC (DRAIN) IMPLANT
GAUZE SPONGE 4X4 16PLY XRAY LF (GAUZE/BANDAGES/DRESSINGS) ×6 IMPLANT
GLOVE BIO SURGEON STRL SZ 6.5 (GLOVE) ×15 IMPLANT
GLOVE BIO SURGEON STRL SZ7.5 (GLOVE) ×6 IMPLANT
GLOVE BIO SURGEONS STRL SZ 6.5 (GLOVE) ×3
GLOVE BIOGEL PI IND STRL 6.5 (GLOVE) ×24 IMPLANT
GLOVE BIOGEL PI IND STRL 7.0 (GLOVE) ×8 IMPLANT
GLOVE BIOGEL PI INDICATOR 6.5 (GLOVE) ×12
GLOVE BIOGEL PI INDICATOR 7.0 (GLOVE) ×4
GLOVE ECLIPSE 6.5 STRL STRAW (GLOVE) ×18 IMPLANT
GLOVE SS BIOGEL STRL SZ 7.5 (GLOVE) ×4 IMPLANT
GLOVE SUPERSENSE BIOGEL SZ 7.5 (GLOVE) ×2
GLOVE SURG SS PI 6.5 STRL IVOR (GLOVE) ×6 IMPLANT
GLOVE SURG SS PI 7.0 STRL IVOR (GLOVE) ×6 IMPLANT
GOWN STRL REUS W/ TWL LRG LVL3 (GOWN DISPOSABLE) ×32 IMPLANT
GOWN STRL REUS W/TWL LRG LVL3 (GOWN DISPOSABLE) ×16
GOWN STRL REUS W/TWL XL LVL3 (GOWN DISPOSABLE) ×6 IMPLANT
KIT BASIN OR (CUSTOM PROCEDURE TRAY) ×6 IMPLANT
KIT ROOM TURNOVER OR (KITS) ×6 IMPLANT
LIQUID BAND (GAUZE/BANDAGES/DRESSINGS) ×18 IMPLANT
NS IRRIG 1000ML POUR BTL (IV SOLUTION) ×12 IMPLANT
PACK PERIPHERAL VASCULAR (CUSTOM PROCEDURE TRAY) ×6 IMPLANT
PAD ARMBOARD 7.5X6 YLW CONV (MISCELLANEOUS) ×6 IMPLANT
PADDING CAST COTTON 6X4 STRL (CAST SUPPLIES) IMPLANT
SET COLLECT BLD 21X3/4 12 (NEEDLE) ×6 IMPLANT
SPONGE SURGIFOAM ABS GEL 100 (HEMOSTASIS) IMPLANT
STAPLER VISISTAT 35W (STAPLE) IMPLANT
STOPCOCK 4 WAY LG BORE MALE ST (IV SETS) ×6 IMPLANT
SUT PROLENE 5 0 C 1 24 (SUTURE) ×6 IMPLANT
SUT PROLENE 6 0 CC (SUTURE) ×12 IMPLANT
SUT PROLENE 7 0 BV 1 (SUTURE) ×12 IMPLANT
SUT PROLENE 7 0 BV1 MDA (SUTURE) IMPLANT
SUT SILK 2 0 SH (SUTURE) ×6 IMPLANT
SUT SILK 3 0 (SUTURE) ×6
SUT SILK 3-0 18XBRD TIE 12 (SUTURE) ×12 IMPLANT
SUT VIC AB 2-0 SH 27 (SUTURE) ×4
SUT VIC AB 2-0 SH 27XBRD (SUTURE) ×8 IMPLANT
SUT VIC AB 3-0 SH 27 (SUTURE) ×16
SUT VIC AB 3-0 SH 27X BRD (SUTURE) ×32 IMPLANT
SUT VIC AB 4-0 PS2 27 (SUTURE) ×24 IMPLANT
SUT VICRYL 4-0 PS2 18IN ABS (SUTURE) ×12 IMPLANT
SYRINGE 3CC LL L/F (MISCELLANEOUS) ×6 IMPLANT
TAPE UMBILICAL COTTON 1/8X30 (MISCELLANEOUS) ×6 IMPLANT
TRAY FOLEY W/METER SILVER 16FR (SET/KITS/TRAYS/PACK) ×6 IMPLANT
TUBING EXTENTION W/L.L. (IV SETS) ×6 IMPLANT
UNDERPAD 30X30 INCONTINENT (UNDERPADS AND DIAPERS) ×6 IMPLANT
WATER STERILE IRR 1000ML POUR (IV SOLUTION) ×6 IMPLANT

## 2015-07-04 NOTE — Progress Notes (Signed)
  Day of Surgery Note    Subjective:  Right foot pain improved from before surgery  Filed Vitals:   07/04/15 1500 07/04/15 1515  BP: 139/81 122/81  Pulse: 71 78  Temp:    Resp: 12 12     Extremities:  +doppler signals right DP/PT   Assessment/Plan:  This is a 45 y.o. male who is s/p Redo Right femoral to below-knee popliteal bypass with contralateral reversed greater saphenous vein, thrombectomy of tibial vessels intraoperative arteriogram   -pt doing well in pacu with +doppler signals right DP/PT -pre-op right foot pain improved -transfer to [address] when bed available   Doreatha MassedSamantha Emmalia Heyboer, PA-C 07/04/2015 3:36 PM

## 2015-07-04 NOTE — H&P (View-Only) (Signed)
  [facility] Progress Note  VASCULAR SURGERY ASSESSMENT & PLAN:  * Plan is for redo bypass right leg potentially using GSV from left leg tomorrow by Dr. Darrick PennaFields.   * On heparin.  * We have discussed a non-operative approach, but he feels that he cannot tolerate his symptoms.  Edward Ferrarihristopher Dickson, MD, FACS Beeper [phone]  Subjective    Pain with right big toe and foot. Walked some yesterday.   Objective Filed Vitals:   07/02/15 2026 07/03/15 0522  BP: 151/87 148/85  Pulse: 70 58  Temp: 97.8 F (36.6 C) 98 F (36.7 C)  Resp:  18    Intake/Output Summary (Last 24 hours) at 07/03/15 0838 Last data filed at 07/03/15 0522  Gross per 24 hour  Intake    120 ml  Output   2000 ml  Net  -1880 ml   Monophasic doppler flow right PT Cyanotic changes to right great toe.   ABIs 07/02/15          PRESSURE WAVEFORM  PRESSURE WAVEFORM  BRACHIAL 118 Triphasic BRACHIAL 117  Triphasic  DP Barely audible  DP 129 Triphasic  AT 50 Dampened Monophasic AT    PT 49 Dampened Monophasic PT 132 Triphasic    RIGHT LEFT  ABI 0.42 1.12        Assessment/Planning: 45 y.o. male with occluded right fem-pop bypass   Patient feels that his symptoms are intolerable and would like to proceed with redo bypass Vein shows adequate right SSV and left GSV vein conduits. Patient is agreeable to possible vein harvest from contralateral leg.  Discussed smoking cessation with patient.  Plan for redo right leg bypass tomorrow with Dr. Darrick PennaFields.   Edward [name]    Component Value Date/Time   WBC 4.9 07/03/2015 0427   WBC 6.7 06/22/2011 1506   HGB 13.0 07/03/2015 0427   HGB 16.2 06/22/2011 1506   HCT 38.0* 07/03/2015 0427   HCT 46.3 06/22/2011 1506   PLT 183 07/03/2015 0427   PLT 192 06/22/2011 1506    BMET    Component Value Date/Time   NA 140 06/30/2015 2115   NA 143 06/22/2011 1506   K  3.9 06/30/2015 2115   K 3.9 06/22/2011 1506   CL 102 06/30/2015 2115   CL 106 06/22/2011 1506   CO2 28 06/30/2015 2115   CO2 27 06/22/2011 1506   GLUCOSE 77 06/30/2015 2115   GLUCOSE 84 06/22/2011 1506   BUN 10 06/30/2015 2115   BUN 16 06/22/2011 1506   CREATININE 1.02 06/30/2015 2115   CREATININE 1.11 06/22/2011 1506   CALCIUM 9.4 06/30/2015 2115   CALCIUM 9.1 06/22/2011 1506   GFRNONAA >60 06/30/2015 2115   GFRNONAA >60 06/22/2011 1506   GFRAA >60 06/30/2015 2115   GFRAA >60 06/22/2011 1506    COAG Lab Results  Component Value Date   INR 1.42 06/30/2015   INR 1.02 05/26/2015   INR 1.12 05/05/2015   No results found for: PTT  Antibiotics Anti-infectives    None       Edward BergerKimberly Delinda Malan, Edward [name] [facility] Office: [phone] [phone] 07/03/2015 8:38 AM

## 2015-07-04 NOTE — Anesthesia Procedure Notes (Signed)
Procedure Name: Intubation Date/Time: 07/04/2015 7:49 AM Performed by: Carmela RimaMARTINELLI, Edward [name] Pre-anesthesia Checklist: Patient being monitored, Suction available, Emergency Drugs available, Patient identified and Timeout performed Patient Re-evaluated:Patient Re-evaluated prior to inductionOxygen Delivery Method: Circle system utilized Preoxygenation: Pre-oxygenation with 100% oxygen Intubation Type: IV induction Ventilation: Mask ventilation without difficulty Laryngoscope Size: Mac and 3 Grade View: Grade I Tube type: Oral Tube size: 7.0 mm Number of attempts: 1 Placement Confirmation: positive ETCO2,  ETT inserted through vocal cords under direct vision and breath sounds checked- equal and bilateral Secured at: 23 cm Tube secured with: Tape Dental Injury: Teeth and Oropharynx as per pre-operative assessment

## 2015-07-04 NOTE — Anesthesia Postprocedure Evaluation (Signed)
Anesthesia Post Note  Patient: Edward RedbirdKevin [name]  Procedure(s) Performed: Procedure(s) (LRB): REDO RIGHT FEMORAL TO BELOW KNEE POPLITEAL BYPASS GRAFT USING CONTRALATERAL  REVERSED GREATER SAPHENOUS VEIN (Right) VEIN HARVEST LEFT GREATER SAPHENOUS  (Left) INTRA OPERATIVE ARTERIOGRAM LEFT LOWER EXTREMITY THROMBECTOMY LEFT  TIBIAL ARTERY (Left)  Patient location during evaluation: PACU Anesthesia Type: General Level of consciousness: awake and alert Pain management: satisfactory to patient Vital Signs Assessment: post-procedure vital signs reviewed and stable Respiratory status: spontaneous breathing, nonlabored ventilation, respiratory function stable and patient connected to nasal cannula oxygen Cardiovascular status: blood pressure returned to baseline and stable Postop Assessment: no signs of nausea or vomiting Anesthetic complications: no    Last Vitals:  Filed Vitals:   07/04/15 1500 07/04/15 1515  BP: 139/81 122/81  Pulse: 71 78  Temp:    Resp: 12 12    Last Pain:  Filed Vitals:   07/04/15 1529  PainSc: 7                  Cecile HearingStephen Edward Jaevian Shean

## 2015-07-04 NOTE — Transfer of Care (Signed)
Immediate Anesthesia Transfer of Care Note  Patient: Edward [name]  Procedure(s) Performed: Procedure(s): REDO RIGHT FEMORAL TO BELOW KNEE POPLITEAL BYPASS GRAFT USING CONTRALATERAL  REVERSED GREATER SAPHENOUS VEIN (Right) VEIN HARVEST LEFT GREATER SAPHENOUS  (Left) INTRA OPERATIVE ARTERIOGRAM LEFT LOWER EXTREMITY THROMBECTOMY LEFT  TIBIAL ARTERY (Left)  Patient Location: PACU  Anesthesia Type:General  Level of Consciousness: awake, alert  and oriented  Airway & Oxygen Therapy: Patient Spontanous Breathing and Patient connected to face mask oxygen  Post-op Assessment: Report given to RN, Post -op Vital signs reviewed and stable and Patient moving all extremities X 4  Post vital signs: Reviewed and stable  Last Vitals:  Filed Vitals:   07/03/15 2122 07/04/15 0307  BP: 135/75 151/73  Pulse: 75 78  Temp: 36.8 C 37.1 C  Resp: 18 18    Complications: No apparent anesthesia complications

## 2015-07-04 NOTE — Anesthesia Preprocedure Evaluation (Addendum)
Anesthesia Evaluation  Patient identified by MRN, date of birth, ID band Patient awake    Reviewed: Allergy & Precautions, NPO status , Patient's Chart, lab work & pertinent test results  History of Anesthesia Complications Negative for: history of anesthetic complications  Airway Mallampati: II  TM Distance: >3 FB Neck ROM: full    Dental  (+) Chipped, Caps, Dental Advidsory Given, Teeth Intact,    Pulmonary shortness of breath, COPD, Current Smoker, PE   breath sounds clear to auscultation       Cardiovascular + CAD, + Past MI, + Peripheral Vascular Disease and + DVT  Normal cardiovascular exam Rhythm:Regular Rate:Normal  S/p fem-pop bypass x2 (both surgeries 05/30/15); now occluded Right fem-pop bypass   Neuro/Psych PSYCHIATRIC DISORDERS Depression  Neuromuscular disease (vertigo)    GI/Hepatic negative GI ROS, Neg liver ROS,   Endo/Other  negative endocrine ROS  Renal/GU negative Renal ROS     Musculoskeletal negative musculoskeletal ROS (+)   Abdominal   Peds  Hematology  (+) Blood dyscrasia (heparin gtt), anemia ,   Anesthesia Other Findings Day of surgery medications reviewed with the patient.  Reproductive/Obstetrics                           Anesthesia Physical Anesthesia Plan  ASA: III  Anesthesia Plan: General   Post-op Pain Management:    Induction: Intravenous  Airway Management Planned: Oral ETT  Additional Equipment:   Intra-op Plan:   Post-operative Plan: Extubation in OR  Informed Consent: I have reviewed the patients History and Physical, chart, labs and discussed the procedure including the risks, benefits and alternatives for the proposed anesthesia with the patient or authorized representative who has indicated his/her understanding and acceptance.   Dental Advisory Given  Plan Discussed with: Anesthesiologist, CRNA and Surgeon  Anesthesia Plan Comments:  (Risks/benefits of general anesthesia discussed with patient including risk of damage to teeth, lips, gum, and tongue, nausea/vomiting, allergic reactions to medications, and the possibility of heart attack, stroke and death.  All patient questions answered.  Patient wishes to proceed.)       Anesthesia Quick Evaluation                                  Anesthesia Evaluation  Patient identified by MRN, date of birth, ID band Patient awake    Reviewed: Allergy & Precautions, NPO status , Patient's Chart, lab work & pertinent test results  History of Anesthesia Complications Negative for: history of anesthetic complications  Airway Mallampati: II  TM Distance: >3 FB Neck ROM: Full    Dental  (+) Teeth Intact, Dental Advisory Given, Poor Dentition, Chipped,    Pulmonary shortness of breath, COPD, Current Smoker,    breath sounds clear to auscultation       Cardiovascular + CAD, + Past MI and + Peripheral Vascular Disease   Rhythm:Regular Rate:Normal     Neuro/Psych  Neuromuscular disease    GI/Hepatic negative GI ROS, Neg liver ROS,   Endo/Other  negative endocrine ROS  Renal/GU negative Renal ROS     Musculoskeletal   Abdominal Normal abdominal exam  (+)   Peds  Hematology negative hematology ROS (+)   Anesthesia Other Findings   Reproductive/Obstetrics  Anesthesia Physical Anesthesia Plan  ASA: III  Anesthesia Plan: General   Post-op Pain Management:    Induction: Intravenous  Airway Management Planned: Oral ETT  Additional Equipment:   Intra-op Plan:   Post-operative Plan: Extubation in OR  Informed Consent: I have reviewed the patients History and Physical, chart, labs and discussed the procedure including the risks, benefits and alternatives for the proposed anesthesia with the patient or authorized representative who has indicated his/her understanding and acceptance.      Plan Discussed with: CRNA and Surgeon  Anesthesia Plan Comments: (This surgery just done this am , now for redo )        Anesthesia Quick Evaluation                                   Anesthesia Evaluation  Patient identified by MRN, date of birth, ID band Patient awake    Reviewed: Allergy & Precautions, NPO status , Patient's Chart, lab work & pertinent test results  History of Anesthesia Complications Negative for: history of anesthetic complications  Airway Mallampati: II  TM Distance: >3 FB Neck ROM: Full    Dental  (+) Teeth Intact, Dental Advisory Given, Poor Dentition, Chipped,    Pulmonary shortness of breath, COPD, Current Smoker,    breath sounds clear to auscultation       Cardiovascular + CAD, + Past MI and + Peripheral Vascular Disease   Rhythm:Regular Rate:Normal     Neuro/Psych  Neuromuscular disease    GI/Hepatic negative GI ROS, Neg liver ROS,   Endo/Other  negative endocrine ROS  Renal/GU negative Renal ROS     Musculoskeletal   Abdominal Normal abdominal exam  (+)   Peds  Hematology negative hematology ROS (+)   Anesthesia Other Findings   Reproductive/Obstetrics                             Anesthesia Physical Anesthesia Plan  ASA: III  Anesthesia Plan: General   Post-op Pain Management:    Induction: Intravenous  Airway Management Planned: Oral ETT  Additional Equipment:   Intra-op Plan:   Post-operative Plan: Extubation in OR  Informed Consent: I have reviewed the patients History and Physical, chart, labs and discussed the procedure including the risks, benefits and alternatives for the proposed anesthesia with the patient or authorized representative who has indicated his/her understanding and acceptance.     Plan Discussed with: CRNA and Surgeon  Anesthesia Plan Comments: (This surgery just done this am , now for redo )        Anesthesia Quick Evaluation

## 2015-07-04 NOTE — Op Note (Signed)
Procedure: Redo Right femoral to below-knee popliteal bypass with contralateral reversed greater saphenous vein, thrombectomy of tibial vessels intraoperative arteriogram  Preoperative diagnosis: Rest pain right foot  Postoperative diagnosis: Same  Anesthesia: General  Asst.: Gretta Beganodd Early MD, Doreatha MassedSamantha Rhyne PA-C Operative findings: #1 2.5-3 mm left greater saphenous vein                                 #2 Patent below-knee popliteal anastomosis with primary peroneal runoff patent proximal anterior tibial and posterior tibial arteries possible spasm distally, very small tibial vessels   Operative details: After obtaining informed consent, the patient was taken to the operating room. The patient was placed in supine position on the operating room table. After induction of general anesthesia and endotracheal intubation, a Foley catheter was placed. Next, both lower extremities were prepped and draped in the usual sterile fashion. A longitudinal incision was then made in the right groin and carried down through the subcutaneous tissues to expose the right common femoral artery. The common femoral artery was dissected free circumferentially. There was a pulse within the common femoral artery. The distal external iliac artery was dissected free circumferentially underneath the inguinal ligament. A vessel loop was also placed around the distal external iliac artery. Dissection was then carried out the level of the femoral bifurcation. The superficial femoral and profunda femoris arteries were dissected free circumferentially and vessel loops placed around these.  Next the left greater saphenous vein was harvested through several skip incisions in the medial left leg. The vein was of good quality but small approximately 3 mm proximally tapering to 2.5 mm . Side branches were ligated and divided between silk ties or clips.  Next, a longitudinal incision was made on the medial aspect of the right leg below the knee.   The incision was deepened down to the level of the fascia. There was dense inflammatory scar tissue.  Dissection was carried down to the level of the below-knee popliteal artery this was dissected free circumferentially.  This was fairly tedious due to the scar.  A pre-existing PTFE graft was dissected free circumferentially as well as the native popliteal artery above and below it and the origins of the tibial vessels.  Several centimeters of the popliteal artery was dissected free to make a reasonable area for grafting of the below-knee popliteal distal anastomosis.  A subsartorial tunnel was created connecting the above-knee incision to the groin. The patient was then given 6000 units of intravenous heparin. The greater saphenous vein was ligated distally and transected. The saphenofemoral junction was then ligated with a 2 0 silk tie. The vein was gently distended with heparinized saline.  After appropriate circulation time of the heparin, the distal right external iliac artery was controlled with a small fistula clamp. The profunda femoris and superficial femoral arteries were controlled with Vesseloops. A longitudinal opening was made in the common femoral artery just above the femoral bifurcation. The was then spatulated and placed in a reversed configuration and sewn end of vein to side of artery using a running 5-0 Prolene suture. The graft was marked for orientation.  The graft was then brought through the subsartorial tunnel down to the below-knee popliteal artery. The popliteal artery was controlled proximally vessel loop and distally with a vessel loop. A longitudinal opening was made in the distal popliteal artery in an area that was fairly free of calcification. There was some thrombus in the  artery which was removed under direct vision.  A #3 Fogarty was used to thrombectomize the tibial vessels and the native proximal popliteal artery.  The graft was then cut to length and spatulated and signed  end of graft to side of artery using running 6-0 Prolene suture. At completion of the anastomosis everything was forebled backbled and thoroughly flushed. The remainder of the anastomosis was completed and all clamps were removed restoring pulsatile flow to the below-knee popliteal artery.  An intraoperative arteriogram was then obtained using inflow occlusion. A 21-gauge butterfly needle was introduced in the proximal aspect of the graft and with the graft clamped proximally contrast was injected through the butterfly needle. This showed good opacification of the distal anastomosis which was patent. There was runoff via the anterior tibial but appeared to have spasm in the distal third of the leg.  The posterior tibial was similar.  The peroneal artery was patent throughout its course.  All of the tibial vessels were very small. The 21 gauge butterfly needle was then removed and the hole repaired with a single 6-0 Prolene suture. The patient had monophasic to biphasic Doppler flow in the posterior tibial area of the foot. This augmented approximately 100% with unclamping the graft.  After hemostasis was obtained, the deep layers and subcutaneous layers of the incisions were closed with running 3-0 Vicryl suture. The skin was closed with a 4-0 Vicryl subcuticular stitch. The groin was inspected and found to be hemostatic. This was then closed in multiple layers of running 2 0 and 3-0 Vicryl suture and 4-0 subcuticular stitch. The patient tolerated the procedure well and there were no complications. Instrument sponge and needle counts correct in the case. Patient was taken to the recovery in stable condition.   Fabienne Bruns, MD  [facility]  Office: [phone]  Pager: [phone]

## 2015-07-04 NOTE — Progress Notes (Signed)
Utilization review completed.  

## 2015-07-04 NOTE — Interval H&P Note (Signed)
History and Physical Interval Note:  07/04/2015 7:28 AM  Edward [name]  has presented today for surgery, with the diagnosis of Occluded right femoral to popliteal artery bypass graft T82.392D  The various methods of treatment have been discussed with the patient and family. After consideration of risks, benefits and other options for treatment, the patient has consented to  Procedure(s): REDO BYPASS GRAFT FEMORAL-POPLITEAL ARTERY (Right) as a surgical intervention .  The patient's history has been reviewed, patient examined, no change in status, stable for surgery.  I have reviewed the patient's chart and labs.  Questions were answered to the patient's satisfaction.     Fabienne BrunsFields, Nataliee Shurtz

## 2015-07-05 ENCOUNTER — Encounter (HOSPITAL_COMMUNITY): Payer: Self-pay | Admitting: Vascular Surgery

## 2015-07-05 ENCOUNTER — Encounter (HOSPITAL_COMMUNITY): Payer: PRIVATE HEALTH INSURANCE

## 2015-07-05 LAB — BASIC METABOLIC PANEL
Anion gap: 8 (ref 5–15)
BUN: <5 mg/dL — ABNORMAL LOW (ref 6–20)
CO2: 28 mmol/L (ref 22–32)
Calcium: 8.2 mg/dL — ABNORMAL LOW (ref 8.9–10.3)
Chloride: 103 mmol/L (ref 101–111)
Creatinine, Ser: 0.97 mg/dL (ref 0.61–1.24)
GFR calc Af Amer: >60 mL/min (ref >60)
GFR calc non Af Amer: >60 mL/min (ref >60)
Glucose, Bld: 120 mg/dL — ABNORMAL HIGH (ref 65–99)
Potassium: 4 mmol/L (ref 3.5–5.1)
Sodium: 139 mmol/L (ref 135–145)

## 2015-07-05 LAB — CBC
HCT: 40.1 % (ref 39.0–52.0)
Hemoglobin: 13.1 g/dL (ref 13.0–17.0)
MCH: 31.3 pg (ref 26.0–34.0)
MCHC: 32.7 g/dL (ref 30.0–36.0)
MCV: 95.9 fL (ref 78.0–100.0)
Platelets: 136 10*3/uL — ABNORMAL LOW (ref 150–400)
RBC: 4.18 MIL/uL — ABNORMAL LOW (ref 4.22–5.81)
RDW: 13.1 % (ref 11.5–15.5)
WBC: 5.3 10*3/uL (ref 4.0–10.5)

## 2015-07-05 MED ORDER — HYDROMORPHONE HCL 1 MG/ML IJ SOLN
0.5000 mg | INTRAMUSCULAR | Status: DC | PRN
  Administered 2015-07-05 – 2015-07-07 (×14): 1 mg via INTRAVENOUS
  Filled 2015-07-05 (×14): qty 1

## 2015-07-05 MED ORDER — HYDROMORPHONE HCL 1 MG/ML IJ SOLN: 0.5000 mg | INTRAMUSCULAR | Status: DC | PRN

## 2015-07-05 NOTE — Progress Notes (Signed)
PT Cancellation Note  Patient Details Name: Edward RedbirdKevin [name] MRN: [id number] DOB: 17-Oct-1970   Cancelled Treatment:    Reason Eval/Treat Not Completed: Pain limiting ability to participate.  Pt refusing OOB w/ OT this am due to pain.  PT will return this afternoon to complete evaluation, schedule permitting.  Encarnacion ChuAshley Gerrianne Aydelott PT, DPT  Pager: [phone] Phone: [phone] 07/05/2015, 10:31 AM

## 2015-07-05 NOTE — Evaluation (Signed)
Physical Therapy Evaluation Patient Details Name: Edward [name] MRN: [id number] DOB: [birth year] Today's Date: 07/05/2015   History of Present Illness  45 yo male s/p Right femoral to below-knee popliteal bypass with contralateral reversed greater saphenous vein, thrombectomy of tibial vessels intraoperative arteriogram. Pt with previous surg 5 weeks. PMH: pneumothorax 2006, emphysema, DVT, CAD, PVD, MI, PE, vertigo, peripheral neuropathy, got, collagen vascular disease, coronary angioplasty with stent, R femoral popliteal bypass graft 05/30/15 x2    Clinical Impression  Patient is s/p above surgery resulting in functional limitations due to the deficits listed below (see PT Problem List). Edward [name] was Ind PTA.  He currently requires supervision>min guard assist for safe transfers and ambulation.  He requires max encouragement to attempt placing Rt foot on floor and ambulates hopping on Lt LE using RW.  Patient will benefit from skilled PT to increase their independence and safety with mobility to allow discharge to the venue listed below.      Follow Up Recommendations Home health PT;Supervision - Intermittent    Equipment Recommendations  Rolling walker with 5" wheels    Recommendations for Other Services       Precautions / Restrictions Precautions Precautions: Fall Restrictions Weight Bearing Restrictions: No      Mobility  Bed Mobility Overal bed mobility: Needs Assistance Bed Mobility: Supine to Sit     Supine to sit: Supervision     General bed mobility comments: Utilizes bed rails w/ increased time and effort.    Transfers Overall transfer level: Needs assistance Equipment used: Rolling walker (2 wheeled) Transfers: Sit to/from Stand Sit to Stand: Min guard         General transfer comment: Cues for hand placement.  Pt slow to stand, does not WB on Rt LE.  Ambulation/Gait Ambulation/Gait assistance: Min guard Ambulation Distance (Feet): 70  Feet Assistive device: Rolling walker (2 wheeled) Gait Pattern/deviations:  (hop on Lt LE)   Gait velocity interpretation: Below normal speed for age/gender General Gait Details: Pt hops on Lt LE. With verbal cues pt attempts to perform TDWB x1 but declines additional attempts due to pain.  Pt very steady using RW.  Stairs            Wheelchair Mobility    Modified Rankin (Stroke Patients Only)       Balance Overall balance assessment: Needs assistance Sitting-balance support: Bilateral upper extremity supported;Feet supported Sitting balance-Leahy Scale: Fair Sitting balance - Comments: posterior lean as pt does not want to place Rt foot on floor due to pain Postural control: Posterior lean Standing balance support: Bilateral upper extremity supported;During functional activity Standing balance-Leahy Scale: Poor Standing balance comment: Relies on support from RW                             Pertinent Vitals/Pain Pain Assessment: 0-10 Pain Score:  (20/10 Rt LE, 2/10 Lt LE) Pain Location: Bil LEs Pain Descriptors / Indicators: Constant;Grimacing;Guarding;Tightness Pain Intervention(s): Limited activity within patient's tolerance;Monitored during session;Repositioned    Home Living Family/patient expects to be discharged to:: Private residence Living Arrangements: Alone Available Help at Discharge: Family;Available PRN/intermittently Type of Home: Mobile home Home Access: Stairs to enter Entrance Stairs-Rails: Can reach both;Left;Right Entrance Stairs-Number of Steps: 3 Home Layout: One level Home Equipment: Crutches;Shower seat;Adaptive equipment Additional Comments: currently on FMLA- works as a Physiological scientist. mother lives next door and sister present during evaluation    Prior Function Level of  Independence: Independent with assistive device(s)         Comments: working full time job.  Using crutches for practice, not for pain      Hand Dominance   Dominant Hand: Right    Extremity/Trunk Assessment   Upper Extremity Assessment: Defer to OT evaluation           Lower Extremity Assessment: RLE deficits/detail;LLE deficits/detail RLE Deficits / Details: limited ROM s/p bypass LLE Deficits / Details: limited ROM s/p bypass  Cervical / Trunk Assessment: Normal  Communication   Communication: No difficulties  Cognition Arousal/Alertness: Awake/alert Behavior During Therapy: WFL for tasks assessed/performed Overall Cognitive Status: Within Functional Limits for tasks assessed                      General Comments      Exercises General Exercises - Lower Extremity Ankle Circles/Pumps: AROM;Both;10 reps;Seated Long Arc Quad: AROM;Both;10 reps;Seated Other Exercises Other Exercises: Encouraged pt to ambulate as much as possible w/ nursing assist, each time attempting to place Rt foot on floor      Assessment/Plan    PT Assessment Patient needs continued PT services  PT Diagnosis Difficulty walking;Abnormality of gait;Acute pain   PT Problem List Decreased strength;Decreased range of motion;Decreased activity tolerance;Decreased balance;Decreased mobility;Decreased safety awareness;Impaired sensation;Pain  PT Treatment Interventions DME instruction;Gait training;Stair training;Functional mobility training;Therapeutic activities;Therapeutic exercise;Balance training;Patient/family education;Modalities   PT Goals (Current goals can be found in the Care Plan section) Acute Rehab PT Goals Patient Stated Goal: decreased pain; [place] PT Goal Formulation: With patient Time For Goal Achievement: 07/26/15 Potential to Achieve Goals: Good    Frequency Min 3X/week   Barriers to discharge Inaccessible home environment;Decreased caregiver support Lives alone w/ steps to enter home    Co-evaluation               End of Session Equipment Utilized During Treatment: Gait  belt Activity Tolerance: Patient limited by pain Patient left: in chair;with call bell/phone within reach;with family/visitor present Nurse Communication: Mobility status         Time: [id number] PT Time Calculation (min) (ACUTE ONLY): 33 min   Charges:   PT Evaluation $PT Eval Moderate Complexity: 1 Procedure PT Treatments $Gait Training: 8-22 mins   PT G Codes:       Encarnacion ChuAshley Taegan Standage PT, DPT  Pager: [phone] Phone: [phone] 07/05/2015, 4:40 PM

## 2015-07-05 NOTE — Progress Notes (Signed)
[facility] Progress Note  Subjective  - POD #1  Having throbbing pain in right calf and foot. Says this is worse than before surgery.   Objective Filed Vitals:   07/05/15 0255 07/05/15 0256  BP:  132/80  Pulse:  90  Temp: 99.3 F (37.4 C)   Resp:  14    Intake/Output Summary (Last 24 hours) at 07/05/15 0724 Last data filed at 07/05/15 0259  Gross per 24 hour  Intake   2845 ml  Output   4800 ml  Net  -1955 ml   Right leg incisions clean and intact. No hematoma.  Right calf is soft. Right foot warm, monophasic right PT doppler signal Left leg saphenectomy incisions clean and intact.   Assessment/Planning: 45 y.o. male is s/p: Right femoral to below-knee popliteal bypass with contralateral reversed greater saphenous vein, thrombectomy of tibial vessels intraoperative arteriogram  1 Day Post-Op   Increasing pain to right foot and calf. No signs of compartment syndrome.  Monophasic right PT doppler signal. Per RN, strength of doppler signal was much stronger at 0600.  Will keep patient NPO until evaluated by Dr. Darrick PennaFields this am. (Had glass of water and some crackers this am).   Edward GurneyKimberly A [name] 07/05/2015 7:24 AM -- Agree with above.  Most likely bypass has occluded.  He has now occluded 2 bypasses in approximately 6 weeks.  He has compromised small vessel outflow.  He no longer has any vein conduit.  He occluded prosthetic conduit with vein patch in a short period.  His arteriogram on the table yesterday showed no technical issues.  I discussed with patient that I do not believe he is a candidate for further bypass procedures as we have reached point of diminishing returns.  At this point will see how his foot does.  If he continues with progressive rest pain he will need AKA vs BKA.  All of this was discussed with the patient this morning.  If his pain progresses will schedule for amputation on Friday 07/08/15.  Fabienne Brunsharles Mariette Cowley, MD [facility] [place] Office: [phone] Pager: [phone]  Laboratory CBC    Component Value Date/Time   WBC 4.9 07/04/2015 0500   WBC 6.7 06/22/2011 1506   HGB 12.7* 07/04/2015 0500   HGB 16.2 06/22/2011 1506   HCT 38.6* 07/04/2015 0500   HCT 46.3 06/22/2011 1506   PLT 137* 07/04/2015 0500   PLT 192 06/22/2011 1506    BMET    Component Value Date/Time   NA 137 07/04/2015 0500   NA 143 06/22/2011 1506   K 4.4 07/04/2015 0500   K 3.9 06/22/2011 1506   CL 103 07/04/2015 0500   CL 106 06/22/2011 1506   CO2 28 07/04/2015 0500   CO2 27 06/22/2011 1506   GLUCOSE 98 07/04/2015 0500   GLUCOSE 84 06/22/2011 1506   BUN 6 07/04/2015 0500   BUN 16 06/22/2011 1506   CREATININE 1.04 07/04/2015 0500   CREATININE 1.11 06/22/2011 1506   CALCIUM 8.4* 07/04/2015 0500   CALCIUM 9.1 06/22/2011 1506   GFRNONAA >60 07/04/2015 0500   GFRNONAA >60 06/22/2011 1506   GFRAA >60 07/04/2015 0500   GFRAA >60 06/22/2011 1506    COAG Lab Results  Component Value Date   INR 1.42 06/30/2015   INR 1.02 05/26/2015   INR 1.12 05/05/2015   No results found for: PTT  Antibiotics Anti-infectives    Start     Dose/Rate Route Frequency Ordered Stop  07/04/15 2200  cefUROXime (ZINACEF) 1.5 g in dextrose 5 % 50 mL IVPB     1.5 g 100 mL/hr over 30 Minutes Intravenous Every 12 hours 07/04/15 1935 07/05/15 2159   07/04/15 1115  cefUROXime (ZINACEF) 1.5 g in dextrose 5 % 50 mL IVPB  Status:  Discontinued     1.5 g 100 mL/hr over 30 Minutes Intravenous To Surgery 07/04/15 1106 07/04/15 1904   07/04/15 0600  cefUROXime (ZINACEF) 1.5 g in dextrose 5 % 50 mL IVPB     1.5 g 100 mL/hr over 30 Minutes Intravenous On call to O.R. 07/03/15 0844 07/04/15 1155       Maris Berger, PA-C [facility] Office: [phone] Pager: [phone] 07/05/2015 7:24 AM

## 2015-07-05 NOTE — Evaluation (Signed)
Occupational Therapy Evaluation Patient Details Name: Edward [name] MRN: [id number] DOB: [birth year] Today's Date: 07/05/2015    History of Present Illness 45 yo male s/p Right femoral to below-knee popliteal bypass with contralateral reversed greater saphenous vein, thrombectomy of tibial vessels intraoperative arteriogram. Pt with previous surg 5 weeks. PMH: pneumothorax 2006, emphysema, DVT, CAD, PVD, MI, PE, vertigo, peripheral neuropathy, got, collagen vascular disease, coronary angioplasty with stent, R femoral popliteal bypass graft 05/30/15 x2   Clinical Impression   Patient is s/p R femoral to below knee popliteal bypass with contralateral reversed surgery resulting in functional limitations due to the deficits listed below (see OT problem list). PTA was at home alone recovery from surg 5 weeks ago with new onset of R foot pain /numbness. Pt currently with dusty color and cool to touch R foot. OT to closely monitor course of treatment regarding R LE for further recommendations. Patient will benefit from skilled OT acutely to increase independence and safety with ADLS to allow discharge TBA. Pt likely to d/c home with no follow if able to wbat on R LE with good return. Pt however currently with 8 out 10 pain unable to tangle at EOB . If patient continues to be limited by pain and unable to bare weight on R LE then could require high level of care.     Follow Up Recommendations  Other (comment) (TBA- will need CIR if amputation required)    Equipment Recommendations  None recommended by OT    Recommendations for Other Services       Precautions / Restrictions Precautions Precautions: Fall Restrictions Weight Bearing Restrictions: No      Mobility Bed Mobility Overal bed mobility: Needs Assistance Bed Mobility: Rolling Rolling: Supervision         General bed mobility comments: requires heavy use of bed rails. pt able to hold R LE in the air to prevent contact with any  other surface for entire peri hygiene  Transfers                 General transfer comment: declined at thist ime    Balance                                            ADL Overall ADL's : Needs assistance/impaired Eating/Feeding: Independent   Grooming: Wash/dry hands;Wash/dry face;Oral care;Independent;Bed level   Upper Body Bathing: Independent;Bed level   Lower Body Bathing: Independent;Bed level                         General ADL Comments: pt reports attempting to complete EOB sitting this am with extreme pain in R foot. pt demonstrates log rolling for bathing but declines EOB attempt. Pt reports pain medication is working slightly but pain remains 8 out 10      Vision     Perception     Praxis      Pertinent Vitals/Pain Pain Assessment: 0-10 Pain Score: 8  Pain Location: R foot  Pain Descriptors / Indicators: Constant Pain Intervention(s): Monitored during session;Repositioned     Hand Dominance Right   Extremity/Trunk Assessment Upper Extremity Assessment Upper Extremity Assessment: Overall WFL for tasks assessed   Lower Extremity Assessment Lower Extremity Assessment: RLE deficits/detail RLE Deficits / Details: dusty appearance, reports extreme pain,    Cervical / Trunk Assessment Cervical / Trunk Assessment: Normal  Communication Communication Communication: No difficulties   Cognition Arousal/Alertness: Awake/alert Behavior During Therapy: WFL for tasks assessed/performed Overall Cognitive Status: Within Functional Limits for tasks assessed                     General Comments       Exercises       Shoulder Instructions      Home Living Family/patient expects to be discharged to:: Private residence Living Arrangements: Alone Available Help at Discharge: Family;Available PRN/intermittently Type of Home: Mobile home Home Access: Stairs to enter Entrance Stairs-Number of Steps: 3 Entrance  Stairs-Rails: Can reach both;Left;Right Home Layout: One level     Bathroom Shower/Tub: Producer, television/film/videoWalk-in shower   Bathroom Toilet: Standard     Home Equipment: Crutches;Shower seat;Adaptive equipment Adaptive Equipment: Reacher;Sock aid Additional Comments: currently on 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 mother lives next door and sister present during evaluation      Prior Functioning/Environment Level of Independence: Independent with assistive device(s)        Comments: working full time job.  Using crutches for practice, not for pain    OT Diagnosis: Acute pain   OT Problem List: Decreased activity tolerance;Decreased strength;Impaired balance (sitting and/or standing);Decreased safety awareness;Decreased knowledge of use of DME or AE;Decreased knowledge of precautions;Pain   OT Treatment/Interventions: Self-care/ADL training;Therapeutic exercise;Energy conservation;DME and/or AE instruction;Therapeutic activities;Patient/family education;Balance training    OT Goals(Current goals can be found in the care plan section) Acute Rehab OT Goals Patient Stated Goal: to walk again OT Goal Formulation: With patient Time For Goal Achievement: 07/19/15 Potential to Achieve Goals: Good  OT Frequency: Min 2X/week   Barriers to D/C: Other (comment) (PRN assistance but lives alone)          Co-evaluation              End of Session Nurse Communication: Mobility status;Precautions  Activity Tolerance: Patient tolerated treatment well Patient left: in bed;with call bell/phone within reach;with family/visitor present   Time: [id number] OT Time Calculation (min): 22 min Charges:  OT General Charges $OT Visit: 1 Procedure OT Evaluation $OT Eval Moderate Complexity: 1 Procedure G-Codes:    Boone MasterJones, Pinki Rottman B 07/05/2015, 9:32 AM   Mateo FlowJones, Brynn   OTR/L Pager: [phone] Office: [phone] .

## 2015-07-06 ENCOUNTER — Telehealth: Payer: Self-pay | Admitting: Vascular Surgery

## 2015-07-06 ENCOUNTER — Inpatient Hospital Stay (HOSPITAL_COMMUNITY): Payer: PRIVATE HEALTH INSURANCE

## 2015-07-06 DIAGNOSIS — I739 Peripheral vascular disease, unspecified: Secondary | ICD-10-CM

## 2015-07-06 LAB — MRSA PCR SCREENING: MRSA by PCR: NEGATIVE

## 2015-07-06 MED ORDER — ENOXAPARIN SODIUM 40 MG/0.4ML ~~LOC~~ SOLN
40.0000 mg | SUBCUTANEOUS | Status: DC
  Administered 2015-07-06 – 2015-07-07 (×2): 40 mg via SUBCUTANEOUS
  Filled 2015-07-06 (×2): qty 0.4

## 2015-07-06 MED ORDER — SODIUM CHLORIDE 0.9 % IV SOLN: INTRAVENOUS | Status: DC

## 2015-07-06 NOTE — Telephone Encounter (Signed)
-----   Message from Sharee PimpleMarilyn K McChesney, RN sent at 07/04/2015  2:21 PM EDT ----- Regarding: schedule   ----- Message -----    From: Dara LordsSamantha J Rhyne, PA-C    Sent: 07/04/2015   2:10 PM      To: Vvs Charge Pool  S/p right fem pop bypass with left leg vein harvest 07/04/15.  F/u with Dr. Darrick PennaFields in 2 weeks.  At that time, he will need a carotid duplex for hx of slurred speech in the past per his H&P.  Thanks, Lelon MastSamantha

## 2015-07-06 NOTE — Progress Notes (Signed)
[facility]

## 2015-07-06 NOTE — Progress Notes (Signed)
PHARMACIST - PHYSICIAN COMMUNICATION  CONCERNING:  Xarelto   RECOMMENDATION: Resume if and when appropriate for history of PE   Thank you Okey RegalLisa Sena Hoopingarner, PharmD [phone]

## 2015-07-06 NOTE — Progress Notes (Signed)
Physical Therapy Treatment Patient Details Name: Edward [name] MRN: [id number] DOB: [birth year] Today's Date: 07/06/2015    History of Present Illness 45 yo male s/p Right femoral to below-knee popliteal bypass with contralateral reversed greater saphenous vein, thrombectomy of tibial vessels intraoperative arteriogram. Pt with previous surg 5 weeks. PMH: pneumothorax 2006, emphysema, DVT, CAD, PVD, MI, PE, vertigo, peripheral neuropathy, got, collagen vascular disease, coronary angioplasty with stent, R femoral popliteal bypass graft 05/30/15 x2    PT Comments    Mr. Kronk is making progress, ambulating 300 ft in hallway using RW and WBing through Rt forefoot.  Emphasized to pt the importance of ambulating at least 3x/day, each time attempting to increase WB through Rt foot.  Pt will benefit from continued skilled PT services to increase functional independence and safety.    Follow Up Recommendations  Home health PT;Supervision - Intermittent     Equipment Recommendations  Rolling walker with 5" wheels    Recommendations for Other Services       Precautions / Restrictions Precautions Precautions: Fall Restrictions Weight Bearing Restrictions: No    Mobility  Bed Mobility Overal bed mobility: Modified Independent Bed Mobility: Supine to Sit     Supine to sit: Modified independent (Device/Increase time)     General bed mobility comments: Increased time.  No use of bed rails.  Transfers Overall transfer level: Needs assistance Equipment used: Rolling walker (2 wheeled) Transfers: Sit to/from Stand Sit to Stand: Supervision         General transfer comment: Pt slow to stand, does not WB on Rt LE.  Excellent controlled descent to sit.  Ambulation/Gait Ambulation/Gait assistance: Supervision Ambulation Distance (Feet): 300 Feet Assistive device: Rolling walker (2 wheeled) Gait Pattern/deviations: Step-to pattern;Decreased stride length;Decreased weight shift to  right   Gait velocity interpretation: Below normal speed for age/gender General Gait Details: Pt WB on forefoot only.  Standing rest break x3 to practice placing Rt foot flat w/ weight shifts.     Stairs            Wheelchair Mobility    Modified Rankin (Stroke Patients Only)       Balance Overall balance assessment: Needs assistance Sitting-balance support: No upper extremity supported;Feet supported Sitting balance-Leahy Scale: Good     Standing balance support: Bilateral upper extremity supported;During functional activity Standing balance-Leahy Scale: Poor Standing balance comment: Relies on RW for support                    Cognition Arousal/Alertness: Awake/alert Behavior During Therapy: WFL for tasks assessed/performed Overall Cognitive Status: Within Functional Limits for tasks assessed                      Exercises General Exercises - Lower Extremity Ankle Circles/Pumps: AROM;Both;10 reps;Seated Other Exercises Other Exercises: Encouraged pt to ambulate as much as possible w/ nursing assist, each time attempting to place Rt foot on floor    General Comments        Pertinent Vitals/Pain Pain Assessment: 0-10 Pain Score: 7  Pain Location: Bil LEs (Rt>Lt) Pain Descriptors / Indicators: Aching;Tightness Pain Intervention(s): Limited activity within patient's tolerance;Monitored during session;Repositioned    Home Living                      Prior Function            PT Goals (current goals can now be found in the care plan section) Acute Rehab PT Goals Patient  Stated Goal: to get home by Friday so he can [facility] the [facility] at home PT Goal Formulation: With patient Time For Goal Achievement: 07/26/15 Potential to Achieve Goals: Good Progress towards PT goals: Progressing toward goals    Frequency  Min 3X/week    PT Plan Current plan remains appropriate    Co-evaluation             End of Session  Equipment Utilized During Treatment: Gait belt Activity Tolerance: Patient limited by pain Patient left: in chair;with call bell/phone within reach;with family/visitor present     Time: [id number] PT Time Calculation (min) (ACUTE ONLY): 30 min  Charges:  $Gait Training: 23-37 mins                    G Codes:      Encarnacion ChuAshley Abashian PT, DPT  Pager: [phone] Phone: [phone] 07/06/2015, 3:57 PM

## 2015-07-06 NOTE — Progress Notes (Signed)
VASCULAR LAB PRELIMINARY  ARTERIAL  ABI completed: Right leg ABI is suggestive of mild arterial insufficiency disease at rest. Left leg ABI appears normal.    RIGHT    LEFT    PRESSURE WAVEFORM  PRESSURE WAVEFORM  BRACHIAL 144 Triphasic BRACHIAL 138 Triphasic  DP 129 Bi/mono DP 166 triphasic  PT 129 biphasic PT 160 triphasic  GREAT TOE  NA GREAT TOE  NA    RIGHT LEFT  ABI .90 1.1     Edward [name], Edward [name], RVT 07/06/2015, 9:09 AM

## 2015-07-06 NOTE — Progress Notes (Signed)
Attempted to call report

## 2015-07-06 NOTE — Progress Notes (Addendum)
[facility] Progress Note  Subjective  - POD #2  Still having pain with right foot and calf but is better. Did ambulate in halls some.   Objective Filed Vitals:   07/06/15 0340 07/06/15 0410  BP: 143/89   Pulse: 78   Temp:  98.8 F (37.1 C)  Resp: 17    Tmax 100 BP sys 120s-150s 02 98% RA  Intake/Output Summary (Last 24 hours) at 07/06/15 0725 Last data filed at 07/06/15 0600  Gross per 24 hour  Intake   4605 ml  Output   4850 ml  Net   -245 ml   Right leg incisions and left leg incisions healing well Monophasic-biphasic right DP and PT doppler flow.   Assessment/Planning: 45 y.o. male is s/p: Right femoral to below-knee popliteal bypass with contralateral reversed greater saphenous vein, thrombectomy of tibial vessels intraoperative arteriogram  2 Days Post-Op   Doppler signals stronger than yesterday morning. Pain has improved.  Patient knows he is at risk for future AKA vs BKA if his rest pain worsens. He knows he needs to quit smoking.  Mobilize. Transfer to 2W.   Cala BradfordKimberly A Trinh 07/06/2015 7:25 AM -- Agree with above Biphasic PT and DP Will get ABI today to assess overall perfusion.  No plan to return to OR if bypass is occluded.  ABI is for overall long term prognosis. Possible d/c Friday.  I will be away tomorrow.  Pt need more work with Pt on ambulation  Fabienne Brunsharles Fields, MD [facility] [place] Office: [phone] Pager: [phone]  Laboratory CBC    Component Value Date/Time   WBC 5.3 07/05/2015 0820   WBC 6.7 06/22/2011 1506   HGB 13.1 07/05/2015 0820   HGB 16.2 06/22/2011 1506   HCT 40.1 07/05/2015 0820   HCT 46.3 06/22/2011 1506   PLT 136* 07/05/2015 0820   PLT 192 06/22/2011 1506    BMET    Component Value Date/Time   NA 139 07/05/2015 0820   NA 143 06/22/2011 1506   K 4.0 07/05/2015 0820   K 3.9 06/22/2011 1506   CL 103 07/05/2015 0820   CL 106 06/22/2011 1506   CO2 28 07/05/2015  0820   CO2 27 06/22/2011 1506   GLUCOSE 120* 07/05/2015 0820   GLUCOSE 84 06/22/2011 1506   BUN <5* 07/05/2015 0820   BUN 16 06/22/2011 1506   CREATININE 0.97 07/05/2015 0820   CREATININE 1.11 06/22/2011 1506   CALCIUM 8.2* 07/05/2015 0820   CALCIUM 9.1 06/22/2011 1506   GFRNONAA >60 07/05/2015 0820   GFRNONAA >60 06/22/2011 1506   GFRAA >60 07/05/2015 0820   GFRAA >60 06/22/2011 1506    COAG Lab Results  Component Value Date   INR 1.42 06/30/2015   INR 1.02 05/26/2015   INR 1.12 05/05/2015   No results found for: PTT  Antibiotics Anti-infectives    Start     Dose/Rate Route Frequency Ordered Stop   07/04/15 2200  cefUROXime (ZINACEF) 1.5 g in dextrose 5 % 50 mL IVPB     1.5 g 100 mL/hr over 30 Minutes Intravenous Every 12 hours 07/04/15 1935 07/05/15 0852   07/04/15 1115  cefUROXime (ZINACEF) 1.5 g in dextrose 5 % 50 mL IVPB  Status:  Discontinued     1.5 g 100 mL/hr over 30 Minutes Intravenous To Surgery 07/04/15 1106 07/04/15 1904   07/04/15 0600  cefUROXime (ZINACEF) 1.5 g in dextrose 5 % 50 mL IVPB     1.5  g 100 mL/hr over 30 Minutes Intravenous On call to O.R. 07/03/15 0844 07/04/15 1155       Maris Berger, PA-C [facility] Office: [phone] Pager: [phone] 07/06/2015 7:25 AM

## 2015-07-06 NOTE — Plan of Care (Signed)
Problem: Health Behavior/Discharge Planning: Goal: Ability to manage health-related needs will improve Outcome: Progressing Pt states that he knows he will not quick smoking until he really decides to be ready to.  He states that he has Chantix at home and has tried it in the past but did not like the dreams he had at night while taking it.  He is willing to think about trying the patch since he has used it before.  He states "I've come this far, I've never been this long without a cigarette. Could be a good start".

## 2015-07-07 NOTE — Progress Notes (Signed)
Utilization review completed.  

## 2015-07-07 NOTE — Care Management Note (Signed)
Case Management Note Previous CM note initiated by  Gae GallopAngela Cole RN, CM  Patient Details  Name: Edward RedbirdKevin Mumpower MRN: [id number] Date of Birth: 19-Mar-1971  Subjective/Objective:                 Admitted with  failed R SFA to BK pop bypass   From home wife. Independent with ADL's. BJY:NWGNFAOZME:crutches.PCP: Terie PurserSamantha Jackson.  Action/Plan:  IV heparin, surgery on Monday 07/04/2015 Return to home when medically stable.CM to f/u with d/c needs.  Expected Discharge Date:    07/08/15              Expected Discharge Plan:  Home w Home Health Services  In-House Referral:     Discharge planning Services  CM Consult  Post Acute Care Choice:  Home Health, Durable Medical Equipment Choice offered to:  Patient, Spouse  DME Arranged:  Walker rolling DME Agency:  [facility]  HH Arr[facility]  [facility]  [facility]  Status of Service:  Completed, signed off  Medicare Important Message Given:    Date Medicare IM Given:    Medicare IM give by:    Date Additional Medicare IM Given:    Additional Medicare Important Message give by:     If discussed at Long Length of Stay Meetings, dates discussed:    Discharge Disposition: home/home health   Additional Comments:  07/07/15- 1030- Donn PieriniKristi Faduma Cho RN, BSN- Pt s/p redo fempop- referral for DME/HH needs- orders for RW and HHPT- in to speak with Pt and family at bedside- pt [place] [facility]- per pt choice [facility]- [facility]-PT with possible d/c tomorrow- pt will also need RW for home- [facility]- DME to be delivered to room prior to discharge. No other CM needs noted-pt to return home with wife.   Donn PieriniWebster, Herson Prichard DustinHall, ArizonaRN,BSN,CM [phone] 07/07/2015, 11:11 AM

## 2015-07-07 NOTE — Progress Notes (Signed)
Occupational Therapy Treatment Patient Details Name: Edward RedbirdKevin [name] MRN: [id number] DOB: 1970-07-11 Today's Date: 07/07/2015    History of present illness 45 yo male s/p Right femoral to below-knee popliteal bypass with contralateral reversed greater saphenous vein, thrombectomy of tibial vessels intraoperative arteriogram. Pt with previous surg 5 weeks. PMH: pneumothorax 2006, emphysema, DVT, CAD, PVD, MI, PE, vertigo, peripheral neuropathy, got, collagen vascular disease, coronary angioplasty with stent, R femoral popliteal bypass graft 05/30/15 x2   OT comments  Pt is able to perform to ADL at a supervision level including LB dressing and standing at sink without support. Use of RW allows him to place more weight on his R LE. Pt is determined to stop smoking, talked about behavioral changes pt can employ.   Follow Up Recommendations  No OT follow up;Supervision - Intermittent    Equipment Recommendations   (Rolling walker)    Recommendations for Other Services      Precautions / Restrictions Precautions Precautions: Fall Restrictions Weight Bearing Restrictions: No       Mobility Bed Mobility Overal bed mobility: Modified Independent             General bed mobility comments: HOB only  Transfers                      Balance     Sitting balance-Leahy Scale: Good       Standing balance-Leahy Scale: Fair Standing balance comment: able to release walker and stand at sink without support for grooming                   ADL Overall ADL's : Needs assistance/impaired     Grooming: Supervision/safety;Wash/dry hands;Brushing hair;Standing       Lower Body Bathing: Supervison/ safety;Sit to/from stand   Upper Body Dressing : Set up;Sitting   Lower Body Dressing: Supervision/safety;Sit to/from stand Lower Body Dressing Details (indicate cue type and reason): able to don socks with extra time Toilet Transfer: Supervision/safety;Ambulation;RW    Toileting- Clothing Manipulation and Hygiene: Supervision/safety;Sit to/from Nurse, children'sstand     Tub/Shower Transfer Details (indicate cue type and reason): pt typically backs up to shower seat in tub and sits prior to swinging his legs over edge Functional mobility during ADLs: Supervision/safety;Rolling walker General ADL Comments: Pt wants a walker for home, states he is able to put more weight on his leg with walker vs using crutches      Vision                     Perception     Praxis      Cognition   Behavior During Therapy: [facility] Suite[facility]FL for tasks assessed/performed Overall Cognitive Status: Within Functional Limits for tasks assessed                       Extremity/Trunk Assessment               Exercises     Shoulder Instructions       General Comments      Pertinent Vitals/ Pain       Pain Assessment: Faces Faces Pain Scale: Hurts even more Pain Location: R LE Pain Descriptors / Indicators: Sore Pain Intervention(s): Monitored during session  Home Living  Prior Functioning/Environment              Frequency Min 2X/week     Progress Toward Goals  OT Goals(current goals can now be found in the care plan section)  Progress towards OT goals: Progressing toward goals  Acute Rehab OT Goals Patient Stated Goal: to get home by Friday so he can watch the [facility] basketball game at home  Plan Discharge plan remains appropriate    Co-evaluation                 End of Session Equipment Utilized During Treatment: Rolling walker   Activity Tolerance Patient tolerated treatment well   Patient Left in bed;with call bell/phone within reach;with family/visitor present   Nurse Communication          Time: 0454-0981 OT Time Calculation (min): 35 min  Charges: OT General Charges $OT Visit: 1 Procedure OT Treatments $Self Care/Home Management : 23-37 mins  Edward [name] 07/07/2015, 10:21 AM  [phone]

## 2015-07-07 NOTE — Progress Notes (Signed)
Physical Therapy Treatment Patient Details Name: Edward RedbirdKevin Peoples MRN: [id number] DOB: 25-May-1970 Today's Date: 07/07/2015    History of Present Illness 45 yo male s/p Right femoral to below-knee popliteal bypass with contralateral reversed greater saphenous vein, thrombectomy of tibial vessels intraoperative arteriogram. Pt with previous surg 5 weeks. PMH: pneumothorax 2006, emphysema, DVT, CAD, PVD, MI, PE, vertigo, peripheral neuropathy, got, collagen vascular disease, coronary angioplasty with stent, R femoral popliteal bypass graft 05/30/15 x2    PT Comments    Pt performed increased gait and successful completion of stair training to simulate safe entry into home.  Pt performed improved R heel strike as gait progressed.  Pt performed increased stairs for improved activity tolerance.  Pt pleasant and agreeable this hospitalization.    Follow Up Recommendations  Home health PT;Supervision - Intermittent     Equipment Recommendations  Rolling walker with 5" wheels    Recommendations for Other Services       Precautions / Restrictions Precautions Precautions: Fall Restrictions Weight Bearing Restrictions: No    Mobility  Bed Mobility Overal bed mobility: Modified Independent Bed Mobility: Supine to Sit     Supine to sit: Modified independent (Device/Increase time)     General bed mobility comments: HOB elevated.  Transfers Overall transfer level: Needs assistance Equipment used: Rolling walker (2 wheeled) Transfers: Sit to/from Stand Sit to Stand: Modified independent (Device/Increase time)   Squat pivot transfers: Modified independent (Device/Increase time)     General transfer comment: Pt remains to limit weight bearing on R LE during sit to stand.    Ambulation/Gait Ambulation/Gait assistance: Supervision Ambulation Distance (Feet): 350 Feet Assistive device: Rolling walker (2 wheeled) Gait Pattern/deviations: Step-through pattern;Decreased stance time -  right;Decreased dorsiflexion - right;Decreased weight shift to right;Trunk flexed   Gait velocity interpretation: Below normal speed for age/gender General Gait Details: Pt heel strike improved as gait progressed.  Required cues for weight shifting R and heel strike on R during gait sequencing.  Step through pattern noted in a start/stop motion.     Stairs Stairs: Yes Stairs assistance: Supervision Stair Management: One rail Right;Sideways;Forwards;Step to pattern Number of Stairs: 10 General stair comments: cues for sequencing and hand and foot placement to improve ease of stair negotiation.  Pt required increased VCs for correct sequencing, forward ascending and sideways descending.    Wheelchair Mobility    Modified Rankin (Stroke Patients Only)       Balance Overall balance assessment: Needs assistance   Sitting balance-Leahy Scale: Good       Standing balance-Leahy Scale: Fair Standing balance comment: able to release walker and stand at sink without support for grooming                    Cognition Arousal/Alertness: Awake/alert Behavior During Therapy: WFL for tasks assessed/performed Overall Cognitive Status: Within Functional Limits for tasks assessed                      Exercises      General Comments        Pertinent Vitals/Pain Pain Assessment: 0-10 Pain Score: 5  Faces Pain Scale: Hurts even more Pain Location: R LE, tight heel cord Pain Descriptors / Indicators: Tightness;Sore Pain Intervention(s): Monitored during session;Repositioned    Home Living                      Prior Function            PT Goals (current  goals can now be found in the care plan section) Acute Rehab PT Goals Patient Stated Goal: go home Potential to Achieve Goals: Good Progress towards PT goals: Progressing toward goals    Frequency  Min 3X/week    PT Plan Current plan remains appropriate    Co-evaluation             End of  Session Equipment Utilized During Treatment: Gait belt Activity Tolerance: Patient tolerated treatment well Patient left: in chair;with call bell/phone within reach;with family/visitor present     Time: 1610-9604 PT Time Calculation (min) (ACUTE ONLY): 24 min  Charges:  $Gait Training: 8-22 mins $Therapeutic Activity: 8-22 mins                    G Codes:      Florestine Avers Jul 31, 2015, 11:31 AM  Joycelyn Rua, PTA pager [phone]

## 2015-07-07 NOTE — Progress Notes (Signed)
Progress Note    07/07/2015 8:12 AM 3 Days Post-Op  Subjective:  Got a better night's sleep last night.  No pain in foot; pain in calf better  Afebrile HR  60's-80's NSR 140's systolic 98% RA  Filed Vitals:   07/06/15 2108 07/07/15 0356  BP: 146/85 140/84  Pulse: 65 84  Temp: 98.7 F (37.1 C) 98.2 F (36.8 C)  Resp: 18 18    Physical Exam: Cardiac:  regular Lungs:  Non labored Incisions:  All are healing appropriately Extremities:  Brisk doppler signals bilateral PT/DP/peroneal; right calf somewhat soft and non tender to palpation.  Sensation and motor are in tact.   CBC    Component Value Date/Time   WBC 5.3 07/05/2015 0820   WBC 6.7 06/22/2011 1506   RBC 4.18* 07/05/2015 0820   RBC 4.77 06/22/2011 1506   HGB 13.1 07/05/2015 0820   HGB 16.2 06/22/2011 1506   HCT 40.1 07/05/2015 0820   HCT 46.3 06/22/2011 1506   PLT 136* 07/05/2015 0820   PLT 192 06/22/2011 1506   MCV 95.9 07/05/2015 0820   MCV 97 06/22/2011 1506   MCH 31.3 07/05/2015 0820   MCH 34.0 06/22/2011 1506   MCHC 32.7 07/05/2015 0820   MCHC 34.9 06/22/2011 1506   RDW 13.1 07/05/2015 0820   RDW 12.4 06/22/2011 1506   LYMPHSABS 3.0 06/30/2015 2115   MONOABS 0.4 06/30/2015 2115   EOSABS 0.3 06/30/2015 2115   BASOSABS 0.0 06/30/2015 2115    BMET    Component Value Date/Time   NA 139 07/05/2015 0820   NA 143 06/22/2011 1506   K 4.0 07/05/2015 0820   K 3.9 06/22/2011 1506   CL 103 07/05/2015 0820   CL 106 06/22/2011 1506   CO2 28 07/05/2015 0820   CO2 27 06/22/2011 1506   GLUCOSE 120* 07/05/2015 0820   GLUCOSE 84 06/22/2011 1506   BUN <5* 07/05/2015 0820   BUN 16 06/22/2011 1506   CREATININE 0.97 07/05/2015 0820   CREATININE 1.11 06/22/2011 1506   CALCIUM 8.2* 07/05/2015 0820   CALCIUM 9.1 06/22/2011 1506   GFRNONAA >60 07/05/2015 0820   GFRNONAA >60 06/22/2011 1506   GFRAA >60 07/05/2015 0820   GFRAA >60 06/22/2011 1506    INR    Component Value Date/Time   INR 1.42  06/30/2015 2115     Intake/Output Summary (Last 24 hours) at 07/07/15 0812 Last data filed at 07/06/15 1900  Gross per 24 hour  Intake   1780 ml  Output   1400 ml  Net    380 ml    ABI's 07/06/15:  RIGHT    LEFT    PRESSURE WAVEFORM  PRESSURE WAVEFORM  BRACHIAL 144 Triphasic BRACHIAL 138 Triphasic  DP 129 Bi/mono DP 166 triphasic  PT 129 biphasic PT 160 triphasic  GREAT TOE  NA GREAT TOE  NA    RIGHT LEFT  ABI .90 1.1         Assessment:  45 y.o. male is s/p:  Right femoral to below-knee popliteal bypass with contralateral reversed greater saphenous vein, thrombectomy of tibial vessels intraoperative arteriogram   3 Days Post-Op And  Right superficial femoral artery to below knee popliteal bypass with reversed ipsilateral great saphenous vein, intraop agram With return to the OR for: Redo right SFA to below knee popliteal bypass with 6 mm Propaten distal vein patch, angiogram 05/30/15   Plan: -pt with patent bypass graft with brisk doppler flow right (and left foot) DP/PT/peroneal  -DVT  prophylaxis:  Lovenox -continue to increase mobilization -face to face ordered for home health. -stressed the importance of smoking cessation.  States he wants to quit and understands the severity of this. -d/c IVF -possibly home tomorrow -pt c/o pain not well controlled-may benefit from a couple of doses of Toradol.  Creatinine okay.  Will d/w Dr. Vista Lawman, PA-C [facility] [phone] 07/07/2015 8:12 AM    I agree with the above, I have seen and examined the patient. ABI  0.9 on right.  Good doppler signals, incisions intact.  Ambulated in halls and up steps today Wants to go home tomorrow  Durene Cal

## 2015-07-08 ENCOUNTER — Telehealth: Payer: Self-pay | Admitting: *Deleted

## 2015-07-08 MED ORDER — OXYCODONE-ACETAMINOPHEN 5-325 MG PO TABS: 1.0000 | ORAL_TABLET | ORAL | Status: DC | PRN

## 2015-07-08 NOTE — Progress Notes (Addendum)
  [facility] Progress Note  Subjective  - POD #4   Pain in foot and leg better.   Objective Filed Vitals:   07/07/15 2205 07/08/15 0422  BP: 150/79 122/64  Pulse: 79 62  Temp: 98.2 F (36.8 C) 97.7 F (36.5 C)  Resp: 18 18   No intake or output data in the 24 hours ending 07/08/15 0739  Right leg incisions and left leg incisions healing well Brisk doppler flow right DP, PT and peroneal  Assessment/Planning: 45 y.o. male is s/p: Right femoral to below-knee popliteal bypass with contralateral reversed greater saphenous vein, thrombectomy of tibial vessels intraoperative arteriogram  4 Days Post-Op   Pain in right foot improving.  Good doppler flow to foot.  D/c home today with home health services.  Restart xarelto   Cala BradfordKimberly A Trinh 07/08/2015 7:39 AM --  ABI 0.9, foot warm, incisions healing wants to go home.  Follow up in a few weeks  Fabienne Brunsharles Fields, MD [facility] [place] Office: [phone] Pager: [phone]   Laboratory CBC    Component Value Date/Time   WBC 5.3 07/05/2015 0820   WBC 6.7 06/22/2011 1506   HGB 13.1 07/05/2015 0820   HGB 16.2 06/22/2011 1506   HCT 40.1 07/05/2015 0820   HCT 46.3 06/22/2011 1506   PLT 136* 07/05/2015 0820   PLT 192 06/22/2011 1506    BMET    Component Value Date/Time   NA 139 07/05/2015 0820   NA 143 06/22/2011 1506   K 4.0 07/05/2015 0820   K 3.9 06/22/2011 1506   CL 103 07/05/2015 0820   CL 106 06/22/2011 1506   CO2 28 07/05/2015 0820   CO2 27 06/22/2011 1506   GLUCOSE 120* 07/05/2015 0820   GLUCOSE 84 06/22/2011 1506   BUN <5* 07/05/2015 0820   BUN 16 06/22/2011 1506   CREATININE 0.97 07/05/2015 0820   CREATININE 1.11 06/22/2011 1506   CALCIUM 8.2* 07/05/2015 0820   CALCIUM 9.1 06/22/2011 1506   GFRNONAA >60 07/05/2015 0820   GFRNONAA >60 06/22/2011 1506   GFRAA >60 07/05/2015 0820   GFRAA >60 06/22/2011 1506    COAG Lab Results  Component Value  Date   INR 1.42 06/30/2015   INR 1.02 05/26/2015   INR 1.12 05/05/2015   No results found for: PTT  Antibiotics Anti-infectives    Start     Dose/Rate Route Frequency Ordered Stop   07/04/15 2200  cefUROXime (ZINACEF) 1.5 g in dextrose 5 % 50 mL IVPB     1.5 g 100 mL/hr over 30 Minutes Intravenous Every 12 hours 07/04/15 1935 07/05/15 0852   07/04/15 1115  cefUROXime (ZINACEF) 1.5 g in dextrose 5 % 50 mL IVPB  Status:  Discontinued     1.5 g 100 mL/hr over 30 Minutes Intravenous To Surgery 07/04/15 1106 07/04/15 1904   07/04/15 0600  cefUROXime (ZINACEF) 1.5 g in dextrose 5 % 50 mL IVPB     1.5 g 100 mL/hr over 30 Minutes Intravenous On call to O.R. 07/03/15 0844 07/04/15 1155       Maris BergerKimberly Trinh, PA-C [facility] Office: [phone] Pager: [phone] 07/08/2015 7:39 AM

## 2015-07-08 NOTE — Telephone Encounter (Signed)
Patient just called to report that his penis has been slightly swollen x 2 days. He was embarrassed and didn't ask anyone about this today prior to discharge. He is having no problems with voiding, no burning or discharge noted. He is afebrile. He states that his testicles aren't swollen just his penis. He is not having any pain in this area. I discussed with him that as long as he doesn't have pain, or any signs of infection, such as fever, burning when voiding, urgency or frequency, we will attribute this swelling to normal postsurgical catheter issue. Patient understands that if he has any other issue, his is to call our answering service and get in touch with our doctor on call this weekend ( Dr. Darrick PennaFields).

## 2015-07-12 ENCOUNTER — Encounter: Payer: Self-pay | Admitting: Vascular Surgery

## 2015-07-13 NOTE — Discharge Summary (Signed)
[facility] Discharge Summary  Edward RedbirdKevin [name] 06-Nov-1970 45 y.o. male  [id number]  Admission Date: 06/30/2015  Discharge Date: 07/08/2015  Physician: Fabienne Brunsharles Fields, MD  Admission Diagnosis: Arterial thrombosis (HCC) [I74.9] S/P popliteal-distal bypass [Z98.890] Claudication of right lower extremity (HCC) [I73.9] Atherosclerosis of extremity with rest pain (HCC) [I70.229]  HPI:   This is a 45 y.o. male [facility] 2-3 days of right foot pain and cyanotic toes. He is s/p failed right SFA to below knee pop bypass with GSV and s/p redo right SFA to below knee bypass with propaten. He had no evidence of threatened limb and a CTA abd/pelvis with bilateral runoff was ordered to evaluate graft patency. His bypass appeared occluded and he was admitted to the hospital and Xarelto held.   Hospital Course:  The patient was admitted to the hospital on 06/30/15. He was started on IV heparin.  Vein mapping revealed an adequate GSV from the ipsilateral leg. The patient was offered observation versus surgery and the patient opted for surgery because he could not tolerate his symptoms.   He was taken to the operating room on 07/04/15 and underwent redo right femoral to below-knee popliteal bypass with contralateral reversed greater saphenous vein, thrombectomy of tibial vessels intraoperative arteriogram   The patient tolerated the procedure well and was transported to the PACU in stable condition.   POD 1: The patient reported worsening pain with his right foot. There was a decrease in the strength of his doppler exam. There was doppler flow of the PT and DP arteries.   POD 2: The patient had improved doppler flow with biphasic right PT and DP signals. His ABIs were:   RIGHT    LEFT    PRESSURE WAVEFORM  PRESSURE WAVEFORM  BRACHIAL 144 Triphasic BRACHIAL 138 Triphasic  DP 129 Bi/mono DP 166 triphasic  PT 129 biphasic PT  160 triphasic  GREAT TOE  NA GREAT TOE  NA    RIGHT LEFT  ABI .90 1.1       He was transferred to the floor.   The remainder of his hospitalization consisted on increasing mobilization and pain control.  By POD 4, the patient's pain was well controlled. He had brisk doppler flow the right foot. His incisions were healing well. His Xarelto was restarted at discharge. He was discharged on POD 4 in good condition.  CBC    Component Value Date/Time   WBC 5.3 07/05/2015 0820   WBC 6.7 06/22/2011 1506   RBC 4.18* 07/05/2015 0820   RBC 4.77 06/22/2011 1506   HGB 13.1 07/05/2015 0820   HGB 16.2 06/22/2011 1506   HCT 40.1 07/05/2015 0820   HCT 46.3 06/22/2011 1506   PLT 136* 07/05/2015 0820   PLT 192 06/22/2011 1506   MCV 95.9 07/05/2015 0820   MCV 97 06/22/2011 1506   MCH 31.3 07/05/2015 0820   MCH 34.0 06/22/2011 1506   MCHC 32.7 07/05/2015 0820   MCHC 34.9 06/22/2011 1506   RDW 13.1 07/05/2015 0820   RDW 12.4 06/22/2011 1506   LYMPHSABS 3.0 06/30/2015 2115   MONOABS 0.4 06/30/2015 2115   EOSABS 0.3 06/30/2015 2115   BASOSABS 0.0 06/30/2015 2115    BMET    Component Value Date/Time   NA 139 07/05/2015 0820   NA 143 06/22/2011 1506   K 4.0 07/05/2015 0820   K 3.9 06/22/2011 1506   CL 103 07/05/2015 0820   CL 106 06/22/2011 1506  CO2 28 07/05/2015 0820   CO2 27 06/22/2011 1506   GLUCOSE 120* 07/05/2015 0820   GLUCOSE 84 06/22/2011 1506   BUN <5* 07/05/2015 0820   BUN 16 06/22/2011 1506   CREATININE 0.97 07/05/2015 0820   CREATININE 1.11 06/22/2011 1506   CALCIUM 8.2* 07/05/2015 0820   CALCIUM 9.1 06/22/2011 1506   GFRNONAA >60 07/05/2015 0820   GFRNONAA >60 06/22/2011 1506   GFRAA >60 07/05/2015 0820   GFRAA >60 06/22/2011 1506     Discharge Instructions:   The patient is discharged to home with extensive instructions on wound care and progressive ambulation.  They are instructed not to drive or perform any heavy lifting until returning to see  the physician in his office.  Discharge Instructions    Call MD for:  redness, tenderness, or signs of infection (pain, swelling, bleeding, redness, odor or green/yellow discharge around incision site)    Complete by:  As directed      Call MD for:  severe or increased pain, loss or decreased feeling  in affected limb(s)    Complete by:  As directed      Call MD for:  temperature >100.5    Complete by:  As directed      Discharge wound care:    Complete by:  As directed   Wash wounds of both legs daily with soap and water and pat dry.     Driving Restrictions    Complete by:  As directed   No driving for 2 weeks     Increase activity slowly    Complete by:  As directed   Walk with assistance use walker or cane as needed     Lifting restrictions    Complete by:  As directed   No lifting for 2 weeks     Resume previous diet    Complete by:  As directed            Discharge Diagnosis:  Arterial thrombosis (HCC) [I74.9] S/P popliteal-distal bypass [Z98.890] Claudication of right lower extremity (HCC) [I73.9] Atherosclerosis of extremity with rest pain (HCC) [I70.229]  Secondary Diagnosis: Patient Active Problem List   Diagnosis Date Noted  . PAD (peripheral artery disease) (HCC) 07/04/2015  . Atherosclerosis of extremity with rest pain (HCC) 07/01/2015  . Atherosclerotic peripheral vascular disease of extremity (HCC) 05/30/2015  . Pre-operative cardiovascular examination 05/26/2015  . Pulmonary embolism (HCC) 09/17/2014  . NSTEMI (non-ST elevated myocardial infarction) (HCC) 05/02/2011  . Hyperlipidemia 05/02/2011  . Gout 05/02/2011  . CAD (coronary artery disease) 10/13/2010  . Smoking 10/13/2010   Past Medical History  Diagnosis Date  . Pneumothorax 2006    s/p chest tube placement x 2   . Emphysema   . DVT (deep venous thrombosis) (HCC)     right leg-takes Xarelto daily bridging with Lovenox for surgery  . Coronary artery disease   . Peripheral vascular disease  (HCC)   . Myocardial infarction (HCC) 2012  . Pulmonary embolism (HCC)   . Emphysema lung (HCC)   . Shortness of breath dyspnea     with exertion  . History of bronchitis   . Vertigo     rarely but doesn't take any meds  . Peripheral neuropathy (HCC)     takes Gabapentin daily  . History of gout     not on any meds  . Depression     not on any meds  . Collagen vascular disease (HCC)        Medication  List    STOP taking these medications        enoxaparin 60 MG/0.6ML injection  Commonly known as:  LOVENOX      TAKE these medications        FLUoxetine 20 MG tablet  Commonly known as:  PROZAC  Take 1 tablet by mouth daily.     nitroGLYCERIN 0.4 MG SL tablet  Commonly known as:  NITROSTAT  Place 0.4 mg under the tongue every 5 (five) minutes as needed. Reported on 06/30/2015     Oxycodone HCl 10 MG Tabs  Take 1.5 tablets q 4 hours PRN pain.     oxyCODONE-acetaminophen 5-325 MG tablet  Commonly known as:  PERCOCET/ROXICET  Take 1 tablet by mouth every 4 (four) hours as needed for severe pain.     rivaroxaban 20 MG Tabs tablet  Commonly known as:  XARELTO  Take 20 mg by mouth daily with supper.        Percocet #30 No Refill  Disposition: Home  Patient's condition: is Good  Follow up: 1. Dr. Darrick Penna in 2 weeks   Maris Berger, PA-C [facility] [phone] 07/13/2015  10:49 AM  - For VQI Registry use --- Instructions: Press F2 to tab through selections.  Delete question if not applicable.   Post-op:  Wound infection: No  Graft infection: No  Transfusion: No   New Arrhythmia: No Ipsilateral amputation: No,  Minor,  BKA,  AKA Discharge patency: [x ] Primary,  Primary assisted,  Secondary,  Occluded Patency judged by: [ x] Dopper only,  Palpable graft pulse,  Palpable distal pulse,  ABI inc. > 0.15,  Duplex Discharge ABI: R 0.9, L 1.1 D/C Ambulatory Status: Ambulatory with  Assistance  Complications: MI: No,  Troponin only,  EKG or Clinical CHF: No Resp failure:No,  Pneumonia,  Ventilator Chg in renal function: No,  Inc. Cr > 0.5,  Temp. Dialysis,  Permanent dialysis Stroke: No,  Minor,  Major Return to OR: No  Reason for return to OR:  Bleeding,  Infection,  Thrombosis,  Revision  Discharge medications: Statin use:  no ASA use:  no Plavix use:  no Beta blocker use: no Coumadin use: no, on Xarelto

## 2015-07-15 ENCOUNTER — Ambulatory Visit: Payer: PRIVATE HEALTH INSURANCE | Admitting: Family

## 2015-07-15 ENCOUNTER — Telehealth: Payer: Self-pay

## 2015-07-15 NOTE — Telephone Encounter (Signed)
Phone call from pt.  Reported his pain medication he rec'd at discharge is not strong enough.  Stated "I'm in a lot of pain, or I wouldn't be calling."  Reported he has incisional pain in the right leg, and behind the knee down to the ankle. Also c/o some left leg incisional pain.  Stated "my incisions look fine."  Advised he will need to come in for office appt. To be evaluated for reordering narcotic pain medication.  Appt. Offered at 2:00 PM with Dr. Imogene Burnhen; pt. Declined stating he is going to PCP this afternoon.  Appt. Offered at 3:45 PM with NP.  Pt. Acceped.

## 2015-07-15 NOTE — Telephone Encounter (Addendum)
Patient called back to our office a little while ago and cancelled his appt for 3:45pm with our NP. He told the appt secretary that " His PCP had taken care of him".  I called his PCP, Terie PurserSamantha Jackson PA at [facility], and she told me that the patient's incision (s/p Redo of right fem-pop BPG by Dr. Darrick PennaFields on 07-04-15) was well approximated, his right foot was warm and pink and he had 1+ DP pulse. She did not realize that he had called our triage today and that we had made him an appt for today but the patient did tell her that he had an appt with Dr. Darrick PennaFields on 07-20-15. She told me that "patient is a former addict but she did give him Rx for pain medication and increased his Gabapentin dose".  She wrote him for 10 days of Oxycodone 15 mg #40 one qid and 10 days of Oxycodone 10 mg #40 one qid. She states that this was all the pain medication she was going to give him and after this 20 day allotment was over, they would be referring him to a pain clinic. She stated that "she is usually very conservative with pain medication but considering that he is a former addict she knew he would probably need the extra dosage".    Will make Dr. Darrick PennaFields aware and will discuss with patient at his postop appt on 07-20-15.

## 2015-07-16 DIAGNOSIS — R42 Dizziness and giddiness: Secondary | ICD-10-CM

## 2015-07-16 HISTORY — DX: Dizziness and giddiness: R42

## 2015-07-20 ENCOUNTER — Encounter: Payer: Self-pay | Admitting: Vascular Surgery

## 2015-07-20 ENCOUNTER — Ambulatory Visit (INDEPENDENT_AMBULATORY_CARE_PROVIDER_SITE_OTHER): Payer: Self-pay | Admitting: Vascular Surgery

## 2015-07-20 VITALS — BP 127/88 | HR 90 | Temp 98.0°F | Resp 18 | Ht 65.0 in | Wt 122.6 lb

## 2015-07-20 DIAGNOSIS — I739 Peripheral vascular disease, unspecified: Secondary | ICD-10-CM

## 2015-07-20 NOTE — Progress Notes (Signed)
Patient is a 45 year old male who returns for postoperative follow-up after redo right femoral to below-knee popliteal bypass with contralateral saphenous vein. Runoff was primarily peroneal with small anterior and posterior tibial arteries. Patient states his walking has improved significantly already. He still has some swelling in the right leg. He has no incisional drainage. Unfortunately he continues to smoke.  Physical exam:  Filed Vitals:   07/20/15 1202  BP: 127/88  Pulse: 90  Temp: 98 F (36.7 C)  TempSrc: Oral  Resp: 18  Height: 5\' 5"  (1.651 m)  Weight: 122 lb 9.6 oz (55.611 kg)  SpO2: 97%   Extremities: Right leg healing incisions some edema below the knee into the foot biphasic to triphasic posterior tibial and dorsalis pedis Doppler flow foot is pink and warm, left leg all saphenectomy incisions well-healed  Assessment: Patent right femoral to below-knee popliteal bypass Plan: Follow-up ABIs and lower extremity duplex in 3 months. The patient is also scheduled to have a carotid duplex on April 10 for possible carotid stenosis and symptoms of TIA in the past.  Fabienne Brunsharles Glenford Garis, MD [facility] [place] Office: [phone] Pager: [phone]

## 2015-07-25 ENCOUNTER — Ambulatory Visit (HOSPITAL_COMMUNITY)
Admission: RE | Admit: 2015-07-25 | Discharge: 2015-07-25 | Disposition: A | Discharge status: Home / Self Care | Payer: No Typology Code available for payment source | Source: Ambulatory Visit | Attending: Vascular Surgery | Admitting: Vascular Surgery

## 2015-07-25 DIAGNOSIS — I2510 Atherosclerotic heart disease of native coronary artery without angina pectoris: Secondary | ICD-10-CM | POA: Diagnosis not present

## 2015-07-25 DIAGNOSIS — R4781 Slurred speech: Secondary | ICD-10-CM | POA: Diagnosis not present

## 2015-07-25 DIAGNOSIS — I739 Peripheral vascular disease, unspecified: Secondary | ICD-10-CM | POA: Diagnosis not present

## 2015-08-11 ENCOUNTER — Other Ambulatory Visit: Payer: Self-pay | Admitting: *Deleted

## 2015-08-11 DIAGNOSIS — I70219 Atherosclerosis of native arteries of extremities with intermittent claudication, unspecified extremity: Secondary | ICD-10-CM

## 2015-09-16 ENCOUNTER — Encounter: Payer: Self-pay | Admitting: Vascular Surgery

## 2015-09-22 ENCOUNTER — Ambulatory Visit (HOSPITAL_COMMUNITY)
Admission: RE | Admit: 2015-09-22 | Discharge: 2015-09-22 | Disposition: A | Discharge status: Home / Self Care | Payer: No Typology Code available for payment source | Source: Ambulatory Visit | Attending: Vascular Surgery | Admitting: Vascular Surgery

## 2015-09-22 ENCOUNTER — Ambulatory Visit (INDEPENDENT_AMBULATORY_CARE_PROVIDER_SITE_OTHER): Payer: PRIVATE HEALTH INSURANCE | Admitting: Vascular Surgery

## 2015-09-22 ENCOUNTER — Other Ambulatory Visit: Payer: Self-pay

## 2015-09-22 ENCOUNTER — Ambulatory Visit (INDEPENDENT_AMBULATORY_CARE_PROVIDER_SITE_OTHER)
Admission: RE | Admit: 2015-09-22 | Discharge: 2015-09-22 | Disposition: A | Discharge status: Home / Self Care | Payer: No Typology Code available for payment source | Source: Ambulatory Visit | Attending: Vascular Surgery | Admitting: Vascular Surgery

## 2015-09-22 ENCOUNTER — Encounter: Payer: Self-pay | Admitting: Vascular Surgery

## 2015-09-22 VITALS — BP 134/82 | HR 94 | Temp 97.9°F | Resp 16 | Ht 66.0 in | Wt 122.0 lb

## 2015-09-22 DIAGNOSIS — F329 Major depressive disorder, single episode, unspecified: Secondary | ICD-10-CM | POA: Insufficient documentation

## 2015-09-22 DIAGNOSIS — G629 Polyneuropathy, unspecified: Secondary | ICD-10-CM | POA: Diagnosis not present

## 2015-09-22 DIAGNOSIS — I2510 Atherosclerotic heart disease of native coronary artery without angina pectoris: Secondary | ICD-10-CM | POA: Insufficient documentation

## 2015-09-22 DIAGNOSIS — I739 Peripheral vascular disease, unspecified: Secondary | ICD-10-CM | POA: Insufficient documentation

## 2015-09-22 DIAGNOSIS — R0989 Other specified symptoms and signs involving the circulatory and respiratory systems: Secondary | ICD-10-CM | POA: Diagnosis present

## 2015-09-22 NOTE — Progress Notes (Signed)
Referring Physician: Avis EpleyJackson, Samantha J, PA*  Patient name: Edward [name] MRN: [id number] DOB: 01/03/1971 Sex: male  REASON FOR CONSULT: Right foot pain  HPI: Edward [name] is a 45 y.o. male,  who underwent right femoral to below-knee popliteal bypass in February 2017. He then underwent a redo bypass in March 2017. He now complains of pain in the right foot that has been going on for several weeks. On duplex ultrasound today he was noted to have an occluded bypass. At this point he is requesting amputation of his right leg. He has rest pain in the right foot. He does not have any open wounds. Unfortunately he continues to smoke. He is on Xarelto for prior pulmonary embolus.  Past Medical History  Diagnosis Date  . Pneumothorax 2006    s/p chest tube placement x 2   . Emphysema   . DVT (deep venous thrombosis) (HCC)     right leg-takes Xarelto daily bridging with Lovenox for surgery  . Coronary artery disease   . Peripheral vascular disease (HCC)   . Myocardial infarction (HCC) 2012  . Pulmonary embolism (HCC)   . Emphysema lung (HCC)   . Shortness of breath dyspnea     with exertion  . History of bronchitis   . Vertigo     rarely but doesn't take any meds  . Peripheral neuropathy (HCC)     takes Gabapentin daily  . History of gout     not on any meds  . Depression     not on any meds  . Collagen vascular disease (HCC)   . Dizziness 07-16-2015   Past Surgical History  Procedure Laterality Date  . Chest tube insertion    . Peripheral vascular catheterization N/A 05/18/2015    Procedure: Abdominal Aortogram;  Surgeon: Nada LibmanVance W Brabham, MD;  Location: [facility] INVASIVE CV LAB;  Service: Cardiovascular;  Laterality: N/A;  . Cardiac catheterization  2012    s/p stent placement  . Coronary angioplasty with stent placement      1 stent  . Hernia repair Right     inguinal  . Femoral-popliteal bypass graft Right 05/30/2015    Procedure: RIGHT SUPERFICIAL FEMORAL TO BELOW KNEE  POPLITEAL ARTERY BYPASS GRAFT;  Surgeon: Sherren Kernsharles E Fields, MD;  [facility];  Service: Vascular;  Laterality: Right;  . Vein harvest Right 05/30/2015    Procedure: RIGHT GREAT SAPHENOUS VEIN HARVEST;  Surgeon: Sherren Kernsharles E Fields, MD;  [facility];  Service: Vascular;  Laterality: Right;  . Intraoperative arteriogram Right 05/30/2015    Procedure: INTRA OPERATIVE ARTERIOGRAM;  Surgeon: Sherren Kernsharles E Fields, MD;  [facility];  Service: Vascular;  Laterality: Right;  . Femoral-popliteal bypass graft Right 05/30/2015    Procedure: RIGHT BYPASS GRAFT FEMORAL-POPLITEAL ARTERY USING PROPATEN 756mmx80cm  GRAFT; WITH VEIN CUFF;  Surgeon: Sherren Kernsharles E Fields, MD;  [facility];  Service: Vascular;  Laterality: Right;  . Intraoperative arteriogram Right 05/30/2015    Procedure: INTRA OPERATIVE ARTERIOGRAM;  Surgeon: Sherren Kernsharles E Fields, MD;  [facility];  Service: Vascular;  Laterality: Right;  . Femoral-popliteal bypass graft Right 07/04/2015    Procedure: REDO RIGHT FEMORAL TO BELOW KNEE POPLITEAL BYPASS GRAFT USING CONTRALATERAL  REVERSED GREATER SAPHENOUS VEIN;  Surgeon: Sherren Kernsharles E Fields, MD;  [facility];  Service: Vascular;  Laterality: Right;  . Vein harvest Left 07/04/2015    Procedure: VEIN HARVEST LEFT GREATER SAPHENOUS ;  Surgeon: Sherren Kernsharles E Fields, MD;  [facility];  Service: Vascular;  Laterality: Left;  . Intraoperative arteriogram  07/04/2015    Procedure: INTRA OPERATIVE ARTERIOGRAM LEFT LOWER EXTREMITY;  Surgeon: Sherren Kerns, MD;  Location[facility] OR;  Service: Vascular;;  . Thrombectomy femoral artery Left 07/04/2015    Procedure: THROMBECTOMY LEFT  TIBIAL ARTERY;  Surgeon: Sherren Kerns, MD;  Location[facility] OR;  Service: Vascular;  Laterality: Left;    Family History  Problem Relation Age of Onset  . Family history unknown: Yes    SOCIAL HISTORY: Social History   Social History  . Marital Status: Single    Spouse Name: N/A  . Number of Children: N/A  . Years of Education:  N/A   Occupational History  . Not on file.   Social History Main Topics  . Smoking status: Current Every Day Smoker -- 1.00 packs/day for 25 years    Types: Cigarettes  . Smokeless tobacco: Never Used  . Alcohol Use: No  . Drug Use: No  . Sexual Activity: Not on file   Other Topics Concern  . Not on file   Social History Narrative    Allergies  Allergen Reactions  . Lisinopril Other (See Comments)    "dropped my blood pressure too low"    Current Outpatient Prescriptions  Medication Sig Dispense Refill  . gabapentin (NEURONTIN) 300 MG capsule Take 300 mg by mouth 3 (three) times daily.    . methylPREDNISolone (MEDROL) 4 MG TBPK tablet     . nitroGLYCERIN (NITROSTAT) 0.4 MG SL tablet Place 0.4 mg under the tongue every 5 (five) minutes as needed. Reported on 07/20/2015    . rivaroxaban (XARELTO) 20 MG TABS tablet Take 20 mg by mouth daily with supper.    Marland Kitchen oxyCODONE 10 MG TABS Take 1.5 tablets q 4 hours PRN pain. (Patient not taking: Reported on 09/22/2015) 40 tablet 0  . oxyCODONE-acetaminophen (PERCOCET/ROXICET) 5-325 MG tablet Take 1 tablet by mouth every 4 (four) hours as needed for severe pain. (Patient not taking: Reported on 09/22/2015) 30 tablet 0   No current facility-administered medications for this visit.    ROS:   General:  No weight loss, Fever, chills  HEENT: No recent headaches, no nasal bleeding, no visual changes, no sore throat  Neurologic: No dizziness, blackouts, seizures. No recent symptoms of stroke or mini- stroke. No recent episodes of slurred speech, or temporary blindness.  Cardiac: No recent episodes of chest pain/pressure, no shortness of breath at rest.  No shortness of breath with exertion.  Denies history of atrial fibrillation or irregular heartbeat  Vascular: No history of rest pain in feet.  No history of claudication.  No history of non-healing ulcer, No history of DVT   Pulmonary: No home oxygen, no productive cough, no hemoptysis,  No  asthma or wheezing  Musculoskeletal:  [ ]  Arthritis, [ ]  Low back pain,  [ ]  Joint pain  Hematologic:No history of hypercoagulable state.  No history of easy bleeding.  No history of anemia  Gastrointestinal: No hematochezia or melena,  No gastroesophageal reflux, no trouble swallowing  Urinary: [ ]  chronic Kidney disease, [ ]  on HD - [ ]  MWF or [ ]  TTHS, [ ]  Burning with urination, [ ]  Frequent urination, [ ]  Difficulty urinating;   Skin: No rashes  Psychological: No history of anxiety,  No history of depression   Physical Examination  Filed Vitals:   09/22/15 1523  BP: 134/82  Pulse: 94  Temp: 97.9 F (36.6 C)  TempSrc: Oral  Resp: 16  Height:   (1.676 m)  Weight: 122 lb (55.339 kg)  SpO2: 97%    Body mass index is 19.7 kg/(m^2).  General:  Alert and oriented, no acute distress HEENT: Normal Neck: No bruit or JVD Pulmonary: Clear to auscultation bilaterally Cardiac: Regular Rate and Rhythm without murmur Abdomen: Soft, non-tender, non-distended, no mass, no scars Skin: No rash Extremity Pulses:  2+ radial, brachial, femoral, absent right popliteal dorsalis pedis, posterior tibial pulses, right foot cool with dependent rubor Musculoskeletal: No deformity or edema  Neurologic: Upper and lower extremity motor 5/5 and symmetric  DATA:  Occluded bypass graft right leg essentially no flow to the right foot  ASSESSMENT:  Ischemia right leg not a candidate for further bypass procedures all of these have been short lived and most likely it is due to small tibial outflow. Discussed with the patient today a right below-knee amputation. I discussed with him since he does not have a popliteal pulse is a 5-10% chance he may not heal below-knee amputation but the advantage would be would have a better chance of walking on a prosthetic if we can salvage his knee joint.   PLAN:  Right below-knee amputation next week. Risk benefits possible palpitations and procedure details  including but not limited to bleeding infection non-healing the wound were discussed patient today. He understands and agrees to proceed.   Fabienne Bruns, MD [facility] [place] Office: [phone] Pager: [phone]

## 2015-09-26 ENCOUNTER — Other Ambulatory Visit (HOSPITAL_COMMUNITY): Payer: Self-pay | Admitting: *Deleted

## 2015-09-26 ENCOUNTER — Encounter (HOSPITAL_COMMUNITY): Payer: Self-pay | Admitting: *Deleted

## 2015-09-26 MED ORDER — CEFUROXIME SODIUM 1.5 G IJ SOLR
1.5000 g | INTRAMUSCULAR | Status: AC
  Administered 2015-09-27: 1.5 g via INTRAVENOUS
  Filled 2015-09-26: qty 1.5

## 2015-09-26 NOTE — Progress Notes (Signed)
Spoke with patient about instructions for surgery.    Instructed patient to take Cymbalta, Gabapentin, and nitro if needed the morning of surgery.  Patient was instructed by Dr. Darrick PennaFields to stop xarelto on 09/24/15 and patient confirmed that he completed the request.  PT/INR  Needed on day of surgery

## 2015-09-26 NOTE — Progress Notes (Signed)
Anesthesia chart review: SAME DAY WORK-UP.  Patient is a 45 year old male scheduled for right BKA on 09/27/15 by Dr. Darrick PennaFields. He is s/p right femoral to popliteal bypass graft on 05/30/2015 using Gore Propaten conduit and s/p redo right femoral to popliteal bypass 07/04/15 using contralateral GSV. Unfortunately, on recent evaluation at [facility] on 09/22/15 he presented with RLE rest pain and his graft was noted to be occluded on duplex U/S.   History includes smoking, emphysema, PTX s/p chest tube '06, PAD s/p right FPBG 05/2015 and 06/2015 (occluded), RLE DVT/PE 09/2014, CAD/MI s/p BMS Ramus '12, exertional dyspnea, peripheral neuropathy, gout, depression, vertigo, right inguinal hernia repair. PCP is listed as Terie PurserSamantha Jackson, PA-C. He was sent to cardiologist Dr. Kirke CorinArida for a pre-operative evaluation [facility] and had a low risk stress test.   Meds include Cymbalta, Neurontin, Medrol Dosepak (last dose 09/26/15), Nitro, Xarelto (last dose 09/24/15).   05/23/15 EKG ([facility]): NSR, rightward axis.   05/27/15 Nuclear stress test:  There was no ST segment deviation noted during stress.  Defect 1: There is a small defect of mild severity present in the apical lateral and apex location. Wall motion is normal. This is likely due to artifact.  No T wave inversion was noted during stress.  The study is normal. The study is suboptimal due to extracardiac activity.  This is a low risk study.  The left ventricular ejection fraction is normal (55-65%).  09/29/2010 Cardiac cath [facility]): 50% mid LAD. 80% ramus. 80% proximal ramus. S/P BMS RAMUS with 0% residual.  07/25/15 Carotid U/S: No significant stenosis of the bilateral ECA or CCA. Doppler velocities suggest 1-39% BICA stenosis.   09/17/14 Chest CTA: IMPRESSION: 1. Posterior basilar segmental pulmonary emboli. 2. Ill-defined airspace disease within the posterior basilar segment of the right lower lobe likely represents ischemic change. 3.  Small right pleural effusion. 4. 3 mm pleural-based nodule along the right major fissure. If the patient is at high risk for bronchogenic carcinoma, follow-up chest CT at 1 year is recommended. If the patient is at low risk, no follow-up is needed. This recommendation follows the consensus statement: Guidelines for Management of Small Pulmonary Nodules Detected on CT Scans: A Statement from the Fleischner Society as published in Radiology 2005; 237:395-400.  He will need labs on arrival.   Patient with low risk stress test in 05/2015. He has tolerated two FPBG procedures within the last ~ 4 months. Unfortunately, he does continue to smoke. No acute cardiopulmonary issues documented from his appointment with Dr. Darrick PennaFields last week. Patient is a same day work-up, so further evaluation on the day of surgery to ensure no acute changes and that labs are acceptable prior to proceeding.  Velna Ochsllison Zelenak, PA-C [facility]/Anesthesiology Phone [phone] 09/26/2015 12:29 PM

## 2015-09-27 ENCOUNTER — Inpatient Hospital Stay (HOSPITAL_COMMUNITY): Payer: PRIVATE HEALTH INSURANCE | Admitting: Vascular Surgery

## 2015-09-27 ENCOUNTER — Encounter (HOSPITAL_COMMUNITY): Payer: Self-pay | Admitting: *Deleted

## 2015-09-27 ENCOUNTER — Encounter (HOSPITAL_COMMUNITY)
Admission: RE | Disposition: A | Discharge status: Home Health | Payer: Self-pay | Source: Ambulatory Visit | Attending: Vascular Surgery

## 2015-09-27 ENCOUNTER — Inpatient Hospital Stay (HOSPITAL_COMMUNITY)
Admission: RE | Admit: 2015-09-27 | Discharge: 2015-10-03 | DRG: 241 | Disposition: A | Discharge status: Home Health | Payer: PRIVATE HEALTH INSURANCE | Source: Ambulatory Visit | Attending: Vascular Surgery | Admitting: Vascular Surgery

## 2015-09-27 DIAGNOSIS — Z955 Presence of coronary angioplasty implant and graft: Secondary | ICD-10-CM

## 2015-09-27 DIAGNOSIS — I739 Peripheral vascular disease, unspecified: Secondary | ICD-10-CM | POA: Diagnosis present

## 2015-09-27 DIAGNOSIS — M79671 Pain in right foot: Secondary | ICD-10-CM | POA: Diagnosis present

## 2015-09-27 DIAGNOSIS — I2510 Atherosclerotic heart disease of native coronary artery without angina pectoris: Secondary | ICD-10-CM | POA: Diagnosis present

## 2015-09-27 DIAGNOSIS — Y838 Other surgical procedures as the cause of abnormal reaction of the patient, or of later complication, without mention of misadventure at the time of the procedure: Secondary | ICD-10-CM | POA: Diagnosis present

## 2015-09-27 DIAGNOSIS — Z7901 Long term (current) use of anticoagulants: Secondary | ICD-10-CM | POA: Diagnosis not present

## 2015-09-27 DIAGNOSIS — T82392A Other mechanical complication of femoral arterial graft (bypass), initial encounter: Principal | ICD-10-CM | POA: Diagnosis present

## 2015-09-27 DIAGNOSIS — F17210 Nicotine dependence, cigarettes, uncomplicated: Secondary | ICD-10-CM | POA: Diagnosis present

## 2015-09-27 DIAGNOSIS — Z79899 Other long term (current) drug therapy: Secondary | ICD-10-CM | POA: Diagnosis not present

## 2015-09-27 DIAGNOSIS — E876 Hypokalemia: Secondary | ICD-10-CM | POA: Diagnosis present

## 2015-09-27 DIAGNOSIS — Z86711 Personal history of pulmonary embolism: Secondary | ICD-10-CM | POA: Diagnosis not present

## 2015-09-27 DIAGNOSIS — G629 Polyneuropathy, unspecified: Secondary | ICD-10-CM | POA: Diagnosis present

## 2015-09-27 DIAGNOSIS — Z888 Allergy status to other drugs, medicaments and biological substances status: Secondary | ICD-10-CM

## 2015-09-27 DIAGNOSIS — I70221 Atherosclerosis of native arteries of extremities with rest pain, right leg: Secondary | ICD-10-CM

## 2015-09-27 DIAGNOSIS — I252 Old myocardial infarction: Secondary | ICD-10-CM | POA: Diagnosis not present

## 2015-09-27 DIAGNOSIS — I998 Other disorder of circulatory system: Secondary | ICD-10-CM | POA: Diagnosis present

## 2015-09-27 DIAGNOSIS — J449 Chronic obstructive pulmonary disease, unspecified: Secondary | ICD-10-CM | POA: Diagnosis present

## 2015-09-27 DIAGNOSIS — Z89511 Acquired absence of right leg below knee: Secondary | ICD-10-CM | POA: Diagnosis not present

## 2015-09-27 DIAGNOSIS — M79606 Pain in leg, unspecified: Secondary | ICD-10-CM | POA: Diagnosis present

## 2015-09-27 HISTORY — PX: AMPUTATION: SHX166

## 2015-09-27 LAB — PROTIME-INR
INR: 1.04 (ref 0.00–1.49)
Prothrombin Time: 13.8 seconds (ref 11.6–15.2)

## 2015-09-27 LAB — CBC
HCT: 46.1 % (ref 39.0–52.0)
Hemoglobin: 15.6 g/dL (ref 13.0–17.0)
MCH: 32.2 pg (ref 26.0–34.0)
MCHC: 33.8 g/dL (ref 30.0–36.0)
MCV: 95.1 fL (ref 78.0–100.0)
Platelets: 188 10*3/uL (ref 150–400)
RBC: 4.85 MIL/uL (ref 4.22–5.81)
RDW: 13 % (ref 11.5–15.5)
WBC: 6.8 10*3/uL (ref 4.0–10.5)

## 2015-09-27 LAB — COMPREHENSIVE METABOLIC PANEL
ALT: 15 U/L — ABNORMAL LOW (ref 17–63)
AST: 14 U/L — ABNORMAL LOW (ref 15–41)
Albumin: 3.7 g/dL (ref 3.5–5.0)
Alkaline Phosphatase: 94 U/L (ref 38–126)
Anion gap: 7 (ref 5–15)
BUN: 11 mg/dL (ref 6–20)
CO2: 30 mmol/L (ref 22–32)
Calcium: 9.2 mg/dL (ref 8.9–10.3)
Chloride: 102 mmol/L (ref 101–111)
Creatinine, Ser: 0.96 mg/dL (ref 0.61–1.24)
GFR calc Af Amer: >60 mL/min (ref >60)
GFR calc non Af Amer: >60 mL/min (ref >60)
Glucose, Bld: 90 mg/dL (ref 65–99)
Potassium: 4.3 mmol/L (ref 3.5–5.1)
Sodium: 139 mmol/L (ref 135–145)
Total Bilirubin: 0.4 mg/dL (ref 0.3–1.2)
Total Protein: 6.2 g/dL — ABNORMAL LOW (ref 6.5–8.1)

## 2015-09-27 SURGERY — AMPUTATION BELOW KNEE
Anesthesia: General | Site: Leg Lower | Laterality: Right

## 2015-09-27 MED ORDER — PROPOFOL 10 MG/ML IV BOLUS
INTRAVENOUS | Status: AC
  Filled 2015-09-27: qty 20

## 2015-09-27 MED ORDER — NITROGLYCERIN 0.4 MG SL SUBL
0.4000 mg | SUBLINGUAL_TABLET | SUBLINGUAL | Status: DC | PRN
Start: 2015-09-27 — End: 2015-10-03

## 2015-09-27 MED ORDER — FENTANYL CITRATE (PF) 250 MCG/5ML IJ SOLN
INTRAMUSCULAR | Status: DC | PRN
  Administered 2015-09-27: 100 ug via INTRAVENOUS
  Administered 2015-09-27 (×3): 50 ug via INTRAVENOUS

## 2015-09-27 MED ORDER — METHYLPREDNISOLONE 4 MG PO TBPK: 4.0000 mg | ORAL_TABLET | ORAL | Status: DC

## 2015-09-27 MED ORDER — POLYETHYLENE GLYCOL 3350 17 G PO PACK
17.0000 g | PACK | Freq: Every day | ORAL | Status: DC | PRN
  Filled 2015-09-27: qty 1

## 2015-09-27 MED ORDER — OXYCODONE HCL 5 MG PO TABS: 5.0000 mg | ORAL_TABLET | Freq: Once | ORAL | Status: DC | PRN

## 2015-09-27 MED ORDER — SODIUM CHLORIDE 0.9% FLUSH: 9.0000 mL | INTRAVENOUS | Status: DC | PRN

## 2015-09-27 MED ORDER — OXYCODONE-ACETAMINOPHEN 5-325 MG PO TABS
1.0000 | ORAL_TABLET | ORAL | Status: DC | PRN
  Administered 2015-09-27 – 2015-10-03 (×25): 2 via ORAL
  Filled 2015-09-27 (×25): qty 2

## 2015-09-27 MED ORDER — MIDAZOLAM HCL 2 MG/2ML IJ SOLN
INTRAMUSCULAR | Status: AC
  Filled 2015-09-27: qty 2

## 2015-09-27 MED ORDER — LIDOCAINE HCL (CARDIAC) 20 MG/ML IV SOLN
INTRAVENOUS | Status: DC | PRN
  Administered 2015-09-27: 40 mg via INTRAVENOUS

## 2015-09-27 MED ORDER — BISACODYL 10 MG RE SUPP: 10.0000 mg | Freq: Every day | RECTAL | Status: DC | PRN

## 2015-09-27 MED ORDER — 0.9 % SODIUM CHLORIDE (POUR BTL) OPTIME
TOPICAL | Status: DC | PRN
  Administered 2015-09-27: 1000 mL

## 2015-09-27 MED ORDER — METOPROLOL TARTRATE 5 MG/5ML IV SOLN
INTRAVENOUS | Status: DC | PRN
  Administered 2015-09-27 (×3): 1 mg via INTRAVENOUS

## 2015-09-27 MED ORDER — FENTANYL CITRATE (PF) 100 MCG/2ML IJ SOLN
50.0000 ug | INTRAMUSCULAR | Status: AC | PRN
  Administered 2015-09-27 (×3): 50 ug via INTRAVENOUS
  Filled 2015-09-27: qty 2

## 2015-09-27 MED ORDER — SODIUM CHLORIDE 0.9 % IV SOLN: INTRAVENOUS | Status: DC

## 2015-09-27 MED ORDER — LIDOCAINE 2% (20 MG/ML) 5 ML SYRINGE
INTRAMUSCULAR | Status: AC
  Filled 2015-09-27: qty 5

## 2015-09-27 MED ORDER — FENTANYL CITRATE (PF) 100 MCG/2ML IJ SOLN
INTRAMUSCULAR | Status: AC
  Administered 2015-09-27: 50 ug via INTRAVENOUS
  Filled 2015-09-27: qty 2

## 2015-09-27 MED ORDER — PROPOFOL 10 MG/ML IV BOLUS
INTRAVENOUS | Status: DC | PRN
  Administered 2015-09-27: 40 mg via INTRAVENOUS
  Administered 2015-09-27: 160 mg via INTRAVENOUS

## 2015-09-27 MED ORDER — DIPHENHYDRAMINE HCL 12.5 MG/5ML PO ELIX: 12.5000 mg | ORAL_SOLUTION | Freq: Four times a day (QID) | ORAL | Status: DC | PRN

## 2015-09-27 MED ORDER — LABETALOL HCL 5 MG/ML IV SOLN
INTRAVENOUS | Status: AC
  Filled 2015-09-27: qty 4

## 2015-09-27 MED ORDER — HYDROMORPHONE 1 MG/ML IV SOLN
INTRAVENOUS | Status: DC
  Administered 2015-09-27: 19:00:00 via INTRAVENOUS
  Administered 2015-09-28: 3.3 mg via INTRAVENOUS
  Administered 2015-09-28: 2.1 mg via INTRAVENOUS
  Administered 2015-09-28: 3 mg via INTRAVENOUS
  Administered 2015-09-28: 3.6 mg via INTRAVENOUS
  Administered 2015-09-28: 2.7 mg via INTRAVENOUS
  Administered 2015-09-28: 1.5 mg via INTRAVENOUS
  Administered 2015-09-29: 02:00:00 via INTRAVENOUS
  Administered 2015-09-29: 2.8 mg via INTRAVENOUS
  Administered 2015-09-29: 1.8 mg via INTRAVENOUS
  Administered 2015-09-29: 0.9 mg via INTRAVENOUS
  Administered 2015-09-29: 4.2 mg via INTRAVENOUS
  Administered 2015-09-29: 3.6 mg via INTRAVENOUS
  Administered 2015-09-29 – 2015-09-30 (×2): 2.1 mg via INTRAVENOUS
  Administered 2015-09-30: 0.9 mg via INTRAVENOUS
  Administered 2015-09-30: 2.7 mg via INTRAVENOUS
  Administered 2015-09-30 – 2015-10-03 (×11): 0 mg via INTRAVENOUS
  Filled 2015-09-27 (×2): qty 25

## 2015-09-27 MED ORDER — DEXTROSE 5 % IV SOLN
1.5000 g | Freq: Two times a day (BID) | INTRAVENOUS | Status: AC
  Administered 2015-09-27 – 2015-09-28 (×2): 1.5 g via INTRAVENOUS
  Filled 2015-09-27 (×2): qty 1.5

## 2015-09-27 MED ORDER — FENTANYL CITRATE (PF) 250 MCG/5ML IJ SOLN
INTRAMUSCULAR | Status: AC
  Filled 2015-09-27: qty 5

## 2015-09-27 MED ORDER — PHENYLEPHRINE HCL 10 MG/ML IJ SOLN
INTRAMUSCULAR | Status: DC | PRN
  Administered 2015-09-27 (×3): 80 ug via INTRAVENOUS

## 2015-09-27 MED ORDER — CHLORHEXIDINE GLUCONATE CLOTH 2 % EX PADS: 6.0000 | MEDICATED_PAD | Freq: Once | CUTANEOUS | Status: DC

## 2015-09-27 MED ORDER — ONDANSETRON HCL 4 MG/2ML IJ SOLN: 4.0000 mg | Freq: Four times a day (QID) | INTRAMUSCULAR | Status: DC | PRN

## 2015-09-27 MED ORDER — GABAPENTIN 300 MG PO CAPS
300.0000 mg | ORAL_CAPSULE | Freq: Three times a day (TID) | ORAL | Status: DC
  Administered 2015-09-27 – 2015-10-03 (×18): 300 mg via ORAL
  Filled 2015-09-27 (×18): qty 1

## 2015-09-27 MED ORDER — DIPHENHYDRAMINE HCL 50 MG/ML IJ SOLN: 12.5000 mg | Freq: Four times a day (QID) | INTRAMUSCULAR | Status: DC | PRN

## 2015-09-27 MED ORDER — ONDANSETRON HCL 4 MG/2ML IJ SOLN
4.0000 mg | Freq: Four times a day (QID) | INTRAMUSCULAR | Status: DC | PRN
Start: 2015-09-27 — End: 2015-09-27

## 2015-09-27 MED ORDER — POTASSIUM CHLORIDE CRYS ER 20 MEQ PO TBCR: 20.0000 meq | EXTENDED_RELEASE_TABLET | Freq: Every day | ORAL | Status: DC | PRN

## 2015-09-27 MED ORDER — HYDRALAZINE HCL 20 MG/ML IJ SOLN: 5.0000 mg | INTRAMUSCULAR | Status: DC | PRN

## 2015-09-27 MED ORDER — ARTIFICIAL TEARS OP OINT
TOPICAL_OINTMENT | OPHTHALMIC | Status: AC
  Filled 2015-09-27: qty 3.5

## 2015-09-27 MED ORDER — OXYCODONE HCL 5 MG/5ML PO SOLN: 5.0000 mg | Freq: Once | ORAL | Status: DC | PRN

## 2015-09-27 MED ORDER — MIDAZOLAM HCL 2 MG/2ML IJ SOLN
1.0000 mg | INTRAMUSCULAR | Status: DC | PRN
  Administered 2015-09-27 (×3): 1 mg via INTRAVENOUS
  Filled 2015-09-27: qty 2

## 2015-09-27 MED ORDER — MORPHINE SULFATE (PF) 2 MG/ML IV SOLN
2.0000 mg | INTRAVENOUS | Status: DC | PRN
  Administered 2015-09-27 – 2015-09-28 (×5): 3 mg via INTRAVENOUS
  Administered 2015-09-29 (×4): 2 mg via INTRAVENOUS
  Administered 2015-09-29: 3 mg via INTRAVENOUS
  Administered 2015-09-30 (×2): 2 mg via INTRAVENOUS
  Filled 2015-09-27 (×5): qty 1
  Filled 2015-09-27: qty 2
  Filled 2015-09-27: qty 1
  Filled 2015-09-27 (×5): qty 2

## 2015-09-27 MED ORDER — GUAIFENESIN-DM 100-10 MG/5ML PO SYRP
15.0000 mL | ORAL_SOLUTION | ORAL | Status: DC | PRN
  Administered 2015-10-02: 15 mL via ORAL
  Filled 2015-09-27: qty 15

## 2015-09-27 MED ORDER — PANTOPRAZOLE SODIUM 40 MG PO TBEC
40.0000 mg | DELAYED_RELEASE_TABLET | Freq: Every day | ORAL | Status: DC
  Administered 2015-09-28 – 2015-10-03 (×6): 40 mg via ORAL
  Filled 2015-09-27 (×6): qty 1

## 2015-09-27 MED ORDER — HYDROMORPHONE HCL 1 MG/ML IJ SOLN: 0.2500 mg | INTRAMUSCULAR | Status: DC | PRN

## 2015-09-27 MED ORDER — DOCUSATE SODIUM 100 MG PO CAPS
100.0000 mg | ORAL_CAPSULE | Freq: Every day | ORAL | Status: DC
  Administered 2015-09-28 – 2015-10-03 (×6): 100 mg via ORAL
  Filled 2015-09-27 (×6): qty 1

## 2015-09-27 MED ORDER — ALUM & MAG HYDROXIDE-SIMETH 200-200-20 MG/5ML PO SUSP: 15.0000 mL | ORAL | Status: DC | PRN

## 2015-09-27 MED ORDER — ONDANSETRON HCL 4 MG/2ML IJ SOLN: 4.0000 mg | Freq: Once | INTRAMUSCULAR | Status: DC | PRN

## 2015-09-27 MED ORDER — NALOXONE HCL 0.4 MG/ML IJ SOLN: 0.4000 mg | INTRAMUSCULAR | Status: DC | PRN

## 2015-09-27 MED ORDER — PHENOL 1.4 % MT LIQD: 1.0000 | OROMUCOSAL | Status: DC | PRN

## 2015-09-27 MED ORDER — ACETAMINOPHEN 325 MG PO TABS
325.0000 mg | ORAL_TABLET | ORAL | Status: DC | PRN
  Administered 2015-09-29 – 2015-10-01 (×3): 650 mg via ORAL
  Filled 2015-09-27 (×3): qty 2

## 2015-09-27 MED ORDER — MIDAZOLAM HCL 5 MG/5ML IJ SOLN
INTRAMUSCULAR | Status: DC | PRN
  Administered 2015-09-27: 2 mg via INTRAVENOUS

## 2015-09-27 MED ORDER — SODIUM CHLORIDE 0.9 % IV SOLN
INTRAVENOUS | Status: DC
  Administered 2015-09-27 – 2015-09-30 (×2): via INTRAVENOUS

## 2015-09-27 MED ORDER — METOPROLOL TARTRATE 5 MG/5ML IV SOLN
2.0000 mg | INTRAVENOUS | Status: DC | PRN
  Administered 2015-09-28: 5 mg via INTRAVENOUS
  Filled 2015-09-27: qty 5

## 2015-09-27 MED ORDER — DULOXETINE HCL 30 MG PO CPEP
30.0000 mg | ORAL_CAPSULE | Freq: Every day | ORAL | Status: DC
  Administered 2015-09-28 – 2015-10-03 (×6): 30 mg via ORAL
  Filled 2015-09-27 (×6): qty 1

## 2015-09-27 MED ORDER — LABETALOL HCL 5 MG/ML IV SOLN
10.0000 mg | INTRAVENOUS | Status: DC | PRN
  Administered 2015-09-27 – 2015-09-28 (×3): 10 mg via INTRAVENOUS
  Filled 2015-09-27 (×2): qty 4

## 2015-09-27 MED ORDER — MIDAZOLAM HCL 2 MG/2ML IJ SOLN
INTRAMUSCULAR | Status: AC
Start: 2015-09-27 — End: 2015-09-27
  Administered 2015-09-27: 1 mg via INTRAVENOUS
  Filled 2015-09-27: qty 2

## 2015-09-27 MED ORDER — LACTATED RINGERS IV SOLN
INTRAVENOUS | Status: DC
  Administered 2015-09-27 (×3): via INTRAVENOUS

## 2015-09-27 MED ORDER — ACETAMINOPHEN 325 MG RE SUPP: 325.0000 mg | RECTAL | Status: DC | PRN

## 2015-09-27 SURGICAL SUPPLY — 48 items
BANDAGE ELASTIC 6 VELCRO ST LF (GAUZE/BANDAGES/DRESSINGS) ×2 IMPLANT
BANDAGE ESMARK 6X9 LF (GAUZE/BANDAGES/DRESSINGS) IMPLANT
BLADE SAW RECIP 87.9 MT (BLADE) ×2 IMPLANT
BNDG COHESIVE 6X5 TAN STRL LF (GAUZE/BANDAGES/DRESSINGS) ×2 IMPLANT
BNDG ESMARK 6X9 LF (GAUZE/BANDAGES/DRESSINGS)
BNDG GAUZE ELAST 4 BULKY (GAUZE/BANDAGES/DRESSINGS) ×2 IMPLANT
CANISTER SUCTION 2500CC (MISCELLANEOUS) ×2 IMPLANT
CLIP TI MEDIUM 6 (CLIP) ×2 IMPLANT
COVER SURGICAL LIGHT HANDLE (MISCELLANEOUS) ×2 IMPLANT
COVER TABLE BACK 60X90 (DRAPES) ×2 IMPLANT
CUFF TOURNIQUET SINGLE 18IN (TOURNIQUET CUFF) IMPLANT
CUFF TOURNIQUET SINGLE 24IN (TOURNIQUET CUFF) IMPLANT
CUFF TOURNIQUET SINGLE 34IN LL (TOURNIQUET CUFF) IMPLANT
CUFF TOURNIQUET SINGLE 44IN (TOURNIQUET CUFF) IMPLANT
DRAIN CHANNEL 19F RND (DRAIN) IMPLANT
DRAPE ORTHO SPLIT 77X108 STRL (DRAPES) ×2
DRAPE PROXIMA HALF (DRAPES) ×4 IMPLANT
DRAPE SURG ORHT 6 SPLT 77X108 (DRAPES) ×2 IMPLANT
DRSG ADAPTIC 3X8 NADH LF (GAUZE/BANDAGES/DRESSINGS) ×2 IMPLANT
ELECT REM PT RETURN 9FT ADLT (ELECTROSURGICAL) ×2
ELECTRODE REM PT RTRN 9FT ADLT (ELECTROSURGICAL) ×1 IMPLANT
EVACUATOR SILICONE 100CC (DRAIN) IMPLANT
GAUZE SPONGE 4X4 12PLY STRL (GAUZE/BANDAGES/DRESSINGS) ×4 IMPLANT
GLOVE BIO SURGEON STRL SZ 6.5 (GLOVE) ×4 IMPLANT
GLOVE BIO SURGEON STRL SZ7.5 (GLOVE) ×2 IMPLANT
GLOVE BIOGEL PI IND STRL 7.0 (GLOVE) ×2 IMPLANT
GLOVE BIOGEL PI INDICATOR 7.0 (GLOVE) ×2
GOWN STRL REUS W/ TWL LRG LVL3 (GOWN DISPOSABLE) ×3 IMPLANT
GOWN STRL REUS W/TWL LRG LVL3 (GOWN DISPOSABLE) ×3
KIT BASIN OR (CUSTOM PROCEDURE TRAY) ×2 IMPLANT
KIT ROOM TURNOVER OR (KITS) ×2 IMPLANT
NS IRRIG 1000ML POUR BTL (IV SOLUTION) ×2 IMPLANT
PACK GENERAL/GYN (CUSTOM PROCEDURE TRAY) ×2 IMPLANT
PAD ARMBOARD 7.5X6 YLW CONV (MISCELLANEOUS) ×4 IMPLANT
SPONGE GAUZE 4X4 12PLY STER LF (GAUZE/BANDAGES/DRESSINGS) ×2 IMPLANT
STAPLER VISISTAT 35W (STAPLE) ×2 IMPLANT
STOCKINETTE IMPERVIOUS LG (DRAPES) ×2 IMPLANT
SUT ETHILON 3 0 PS 1 (SUTURE) IMPLANT
SUT SILK 2 0 (SUTURE) ×2
SUT SILK 2 0 SH CR/8 (SUTURE) ×2 IMPLANT
SUT SILK 2-0 18XBRD TIE 12 (SUTURE) ×2 IMPLANT
SUT SILK 3 0 (SUTURE) ×1
SUT SILK 3-0 18XBRD TIE 12 (SUTURE) ×1 IMPLANT
SUT VIC AB 2-0 SH 18 (SUTURE) ×8 IMPLANT
SUT VIC AB 3-0 SH 27 (SUTURE) ×1
SUT VIC AB 3-0 SH 27X BRD (SUTURE) ×1 IMPLANT
UNDERPAD 30X30 INCONTINENT (UNDERPADS AND DIAPERS) ×2 IMPLANT
WATER STERILE IRR 1000ML POUR (IV SOLUTION) ×2 IMPLANT

## 2015-09-27 NOTE — Anesthesia Preprocedure Evaluation (Deleted)
Anesthesia Evaluation  Patient identified by MRN, date of birth, ID band Patient awake    Reviewed: Allergy & Precautions, NPO status , Patient's Chart, lab work & pertinent test results  Airway Mallampati: II  TM Distance: >3 FB Neck ROM: Full    Dental  (+) Teeth Intact, Poor Dentition, Dental Advisory Given   Pulmonary Current Smoker,    breath sounds clear to auscultation       Cardiovascular  Rhythm:Regular Rate:Normal     Neuro/Psych    GI/Hepatic   Endo/Other    Renal/GU      Musculoskeletal   Abdominal   Peds  Hematology   Anesthesia Other Findings   Reproductive/Obstetrics                            Anesthesia Physical Anesthesia Plan  ASA: III  Anesthesia Plan: General   Post-op Pain Management:    Induction: Intravenous  Airway Management Planned: Oral ETT  Additional Equipment:   Intra-op Plan:   Post-operative Plan: Extubation in OR  Informed Consent: I have reviewed the patients History and Physical, chart, labs and discussed the procedure including the risks, benefits and alternatives for the proposed anesthesia with the patient or authorized representative who has indicated his/her understanding and acceptance.   Dental advisory given  Plan Discussed with: CRNA and Anesthesiologist  Anesthesia Plan Comments:         Anesthesia Quick Evaluation

## 2015-09-27 NOTE — Anesthesia Postprocedure Evaluation (Signed)
Anesthesia Post Note  Patient: Bernette RedbirdKevin Cutler  Procedure(s) Performed: Procedure(s) (LRB): AMPUTATION BELOW KNEE (Right)  Patient location during evaluation: PACU Anesthesia Type: General Level of consciousness: awake Pain management: pain level controlled Vital Signs Assessment: post-procedure vital signs reviewed and stable Respiratory status: spontaneous breathing Cardiovascular status: stable Anesthetic complications: no    Last Vitals:  Filed Vitals:   09/27/15 1553 09/27/15 1631  BP: 155/107 164/103  Pulse: 88 82  Temp:  36.5 C  Resp: 17 17    Last Pain:  Filed Vitals:   09/27/15 1632  PainSc: 7                  EDWARDS,Raywood Wailes

## 2015-09-27 NOTE — Transfer of Care (Signed)
Immediate Anesthesia Transfer of Care Note  Patient: Edward [name]  Procedure(s) Performed: Procedure(s): AMPUTATION BELOW KNEE (Right)  Patient Location: PACU  Anesthesia Type:General  Level of Consciousness: sedated and responds to stimulation  Airway & Oxygen Therapy: Patient Spontanous Breathing and Patient connected to nasal cannula oxygen  Post-op Assessment: Report given to RN, Post -op Vital signs reviewed and stable and Patient moving all extremities  Post vital signs: Reviewed and stable  Last Vitals:  Filed Vitals:   09/27/15 0921  BP: 157/102  Pulse: 93  Temp: 36.6 C  Resp: 16    Last Pain:  Filed Vitals:   09/27/15 1237  PainSc: 7       Patients Stated Pain Goal: 1 (09/27/15 0947)  Complications: No apparent anesthesia complications

## 2015-09-27 NOTE — Op Note (Signed)
Procedure: Right below-knee amputation  Preoperative diagnosis: Rest pain right foot  Postoperative diagnosis: Same  Anesthesia Gen.  Assistant: Doreatha MassedSamantha Rhyne PA-C  Operative findings: #1 small tibial vessels with reasonably well perfused muscle tissue  Operative details: After obtaining informed consent, the patient was taken to the operating room. The patient was placed in supine position on the operating table. After induction of general anesthesia and endotracheal intubation, the patient's entire right lower extremity was prepped and draped all the way down to the level of the ankle. Next a transverse incision was made approximately 4 fingerbreadths below the tibial tuberosity on the right leg.  The  incision was carried posteriorly to the midportion of the leg and then extended longitudinally to create a posterior flap. The subcutaneous tissues and fascia was taken down with cautery. Periosteum was raised on the tibia approximately 5 cm above the skin edge.  The periosteum was also raised on the fibula several centimeters above this. The tibia was then divided with a saw. The fibula was divided with a bone cutter. The leg was then elevated in the operative field and the amputation was completed posterior to the bones with an amputation knife. Hemostasis was then obtained with cautery and several suture ligatures. The tibial and sural nerves were pulled down into the field transected and allowed to retract up into the leg.  After hemostasis was obtained, the wound was thoroughly irrigated with  normal saline solution. The fascial edges were then reapproximated with interrupted 2-0 Vicryl sutures. Subcutaneous tissues were reapproximated using running 3-0 Vicryl suture. The skin was closed with staples. The patient tolerated the procedure well and there were no complications. Instrument sponge and  needle counts were correct at the end of the case. The patient was taken to the recovery room in stable  condition.  Fabienne Brunsharles Majesti Gambrell, MD [facility] [place] Office: [phone] Pager: [phone]

## 2015-09-27 NOTE — Interval H&P Note (Signed)
History and Physical Interval Note:  09/27/2015 1:22 PM  Edward [name]  has presented today for surgery, with the diagnosis of Occluded right femoral to popliteal artery bypass graft T82.392A; Right foot pain M79.671  The various methods of treatment have been discussed with the patient and family. After consideration of risks, benefits and other options for treatment, the patient has consented to  Procedure(s): AMPUTATION BELOW KNEE (Right) as a surgical intervention .  The patient's history has been reviewed, patient examined, no change in status, stable for surgery.  I have reviewed the patient's chart and labs.  Questions were answered to the patient's satisfaction.     Fabienne BrunsFields, Charles

## 2015-09-27 NOTE — H&P (View-Only) (Signed)
  Referring Physician: Jackson, Samantha J, PA*  Patient name: Edward [name] MRN: [id number] DOB: 02/09/1971 Sex: male  REASON FOR CONSULT: Right foot pain  HPI: Edward [name] is a 45 y.o. male,  who underwent right femoral to below-knee popliteal bypass in February 2017. He then underwent a redo bypass in March 2017. He now complains of pain in the right foot that has been going on for several weeks. On duplex ultrasound today he was noted to have an occluded bypass. At this point he is requesting amputation of his right leg. He has rest pain in the right foot. He does not have any open wounds. Unfortunately he continues to smoke. He is on Xarelto for prior pulmonary embolus.  Past Medical History  Diagnosis Date  . Pneumothorax 2006    s/p chest tube placement x 2   . Emphysema   . DVT (deep venous thrombosis) (HCC)     right leg-takes Xarelto daily bridging with Lovenox for surgery  . Coronary artery disease   . Peripheral vascular disease (HCC)   . Myocardial infarction (HCC) 2012  . Pulmonary embolism (HCC)   . Emphysema lung (HCC)   . Shortness of breath dyspnea     with exertion  . History of bronchitis   . Vertigo     rarely but doesn't take any meds  . Peripheral neuropathy (HCC)     takes Gabapentin daily  . History of gout     not on any meds  . Depression     not on any meds  . Collagen vascular disease (HCC)   . Dizziness 07-16-2015   Past Surgical History  Procedure Laterality Date  . Chest tube insertion    . Peripheral vascular catheterization N/A 05/18/2015    Procedure: Abdominal Aortogram;  Surgeon: Vance W Brabham, MD;  Location: [facility];  Service: Cardiovascular;  Laterality: N/A;  . Cardiac catheterization  2012    s/p stent placement  . Coronary angioplasty with stent placement      1 stent  . Hernia repair Right     inguinal  . Femoral-popliteal bypass graft Right 05/30/2015    Procedure: RIGHT SUPERFICIAL FEMORAL TO BELOW KNEE  POPLITEAL ARTERY BYPASS GRAFT;  Surgeon: Tishawn Friedhoff E Lateesha Bezold, MD;  Location: [facility] OR;  Service: Vascular;  Laterality: Right;  . Vein harvest Right 05/30/2015    Procedure: RIGHT GREAT SAPHENOUS VEIN HARVEST;  Surgeon: Delvonte Berenson E Thurman Sarver, MD;  Location: [facility] OR;  Service: Vascular;  Laterality: Right;  . Intraoperative arteriogram Right 05/30/2015    Procedure: INTRA OPERATIVE ARTERIOGRAM;  Surgeon: Morse Brueggemann E Arin Peral, MD;  Location: [facility];  Service: Vascular;  Laterality: Right;  . Femoral-popliteal bypass graft Right 05/30/2015    Procedure: RIGHT BYPASS GRAFT FEMORAL-POPLITEAL ARTERY USING PROPATEN 6mmx80cm  GRAFT; WITH VEIN CUFF;  Surgeon: Ashtan Girtman E Goldia Ligman, MD;  Location: [facility];  Service: Vascular;  Laterality: Right;  . Intraoperative arteriogram Right 05/30/2015    Procedure: INTRA OPERATIVE ARTERIOGRAM;  Surgeon: Joseph Johns E Nicolasa Milbrath, MD;  Location: [facility];  Service: Vascular;  Laterality: Right;  . Femoral-popliteal bypass graft Right 07/04/2015    Procedure: REDO RIGHT FEMORAL TO BELOW KNEE POPLITEAL BYPASS GRAFT USING CONTRALATERAL  REVERSED GREATER SAPHENOUS VEIN;  Surgeon: Montravious Weigelt E Dorann Davidson, MD;  Location: [facility];  Service: Vascular;  Laterality: Right;  . Vein harvest Left 07/04/2015    Procedure: VEIN HARVEST LEFT GREATER SAPHENOUS ;  Surgeon: Tawfiq Favila E Phil Corti, MD;  Location: [facility];  Service: Vascular;    Laterality: Left;  . Intraoperative arteriogram  07/04/2015    Procedure: INTRA OPERATIVE ARTERIOGRAM LEFT LOWER EXTREMITY;  Surgeon: Laylonie Marzec E Gaylin Bulthuis, MD;  Location: [facility];  Service: Vascular;;  . Thrombectomy femoral artery Left 07/04/2015    Procedure: THROMBECTOMY LEFT  TIBIAL ARTERY;  Surgeon: Johnell Landowski E Braeton Wolgamott, MD;  Location: [facility];  Service: Vascular;  Laterality: Left;    Family History  Problem Relation Age of Onset  . Family history unknown: Yes    SOCIAL HISTORY: Social History   Social History  . Marital Status: Single    Spouse Name: N/A  . Number of Children: N/A  . Years of Education:  N/A   Occupational History  . Not on file.   Social History Main Topics  . Smoking status: Current Every Day Smoker -- 1.00 packs/day for 25 years    Types: Cigarettes  . Smokeless tobacco: Never Used  . Alcohol Use: No  . Drug Use: No  . Sexual Activity: Not on file   Other Topics Concern  . Not on file   Social History Narrative    Allergies  Allergen Reactions  . Lisinopril Other (See Comments)    "dropped my blood pressure too low"    Current Outpatient Prescriptions  Medication Sig Dispense Refill  . gabapentin (NEURONTIN) 300 MG capsule Take 300 mg by mouth 3 (three) times daily.    . methylPREDNISolone (MEDROL) 4 MG TBPK tablet     . nitroGLYCERIN (NITROSTAT) 0.4 MG SL tablet Place 0.4 mg under the tongue every 5 (five) minutes as needed. Reported on 07/20/2015    . rivaroxaban (XARELTO) 20 MG TABS tablet Take 20 mg by mouth daily with supper.    . oxyCODONE 10 MG TABS Take 1.5 tablets q 4 hours PRN pain. (Patient not taking: Reported on 09/22/2015) 40 tablet 0  . oxyCODONE-acetaminophen (PERCOCET/ROXICET) 5-325 MG tablet Take 1 tablet by mouth every 4 (four) hours as needed for severe pain. (Patient not taking: Reported on 09/22/2015) 30 tablet 0   No current facility-administered medications for this visit.    ROS:   General:  No weight loss, Fever, chills  HEENT: No recent headaches, no nasal bleeding, no visual changes, no sore throat  Neurologic: No dizziness, blackouts, seizures. No recent symptoms of stroke or mini- stroke. No recent episodes of slurred speech, or temporary blindness.  Cardiac: No recent episodes of chest pain/pressure, no shortness of breath at rest.  No shortness of breath with exertion.  Denies history of atrial fibrillation or irregular heartbeat  Vascular: No history of rest pain in feet.  No history of claudication.  No history of non-healing ulcer, No history of DVT   Pulmonary: No home oxygen, no productive cough, no hemoptysis,  No  asthma or wheezing  Musculoskeletal:  [ ] Arthritis, [ ] Low back pain,  [ ] Joint pain  Hematologic:No history of hypercoagulable state.  No history of easy bleeding.  No history of anemia  Gastrointestinal: No hematochezia or melena,  No gastroesophageal reflux, no trouble swallowing  Urinary: [ ] chronic Kidney disease, [ ] on HD - [ ] MWF or [ ] TTHS, [ ] Burning with urination, [ ] Frequent urination, [ ] Difficulty urinating;   Skin: No rashes  Psychological: No history of anxiety,  No history of depression   Physical Examination  Filed Vitals:   09/22/15 1523  BP: 134/82  Pulse: 94  Temp: 97.9 F (36.6 C)  TempSrc: Oral  Resp: 16  Height:   5' 6" (1.676 m)  Weight: 122 lb (55.339 kg)  SpO2: 97%    Body mass index is 19.7 kg/(m^2).  General:  Alert and oriented, no acute distress HEENT: Normal Neck: No bruit or JVD Pulmonary: Clear to auscultation bilaterally Cardiac: Regular Rate and Rhythm without murmur Abdomen: Soft, non-tender, non-distended, no mass, no scars Skin: No rash Extremity Pulses:  2+ radial, brachial, femoral, absent right popliteal dorsalis pedis, posterior tibial pulses, right foot cool with dependent rubor Musculoskeletal: No deformity or edema  Neurologic: Upper and lower extremity motor 5/5 and symmetric  DATA:  Occluded bypass graft right leg essentially no flow to the right foot  ASSESSMENT:  Ischemia right leg not a candidate for further bypass procedures all of these have been short lived and most likely it is due to small tibial outflow. Discussed with the patient today a right below-knee amputation. I discussed with him since he does not have a popliteal pulse is a 5-10% chance he may not heal below-knee amputation but the advantage would be would have a better chance of walking on a prosthetic if we can salvage his knee joint.   PLAN:  Right below-knee amputation next week. Risk benefits possible palpitations and procedure details  including but not limited to bleeding infection non-healing the wound were discussed patient today. He understands and agrees to proceed.   Cecil Bixby, MD [facility] [place] [facility] Office: [phone] Pager: [phone]    

## 2015-09-27 NOTE — Anesthesia Procedure Notes (Signed)
Procedure Name: LMA Insertion Date/Time: 09/27/2015 1:41 PM Performed by: Tillman AbideHAWKINS, Margues Filippini B Pre-anesthesia Checklist: Patient identified, Emergency Drugs available, Suction available and Patient being monitored Patient Re-evaluated:Patient Re-evaluated prior to inductionOxygen Delivery Method: Circle System Utilized Preoxygenation: Pre-oxygenation with 100% oxygen Intubation Type: IV induction Ventilation: Mask ventilation without difficulty LMA: LMA inserted LMA Size: 4.0 Number of attempts: 1 Airway Equipment and Method: Bite block Placement Confirmation: positive ETCO2 Tube secured with: Tape Dental Injury: Teeth and Oropharynx as per pre-operative assessment

## 2015-09-28 ENCOUNTER — Telehealth: Payer: Self-pay | Admitting: Vascular Surgery

## 2015-09-28 ENCOUNTER — Encounter (HOSPITAL_COMMUNITY): Payer: Self-pay | Admitting: Vascular Surgery

## 2015-09-28 DIAGNOSIS — Z89511 Acquired absence of right leg below knee: Secondary | ICD-10-CM

## 2015-09-28 LAB — URINALYSIS, ROUTINE W REFLEX MICROSCOPIC
Bilirubin Urine: NEGATIVE
Glucose, UA: NEGATIVE mg/dL
Hgb urine dipstick: NEGATIVE
Ketones, ur: NEGATIVE mg/dL
Leukocytes, UA: NEGATIVE
Nitrite: NEGATIVE
Protein, ur: NEGATIVE mg/dL
Specific Gravity, Urine: 1.012 (ref 1.005–1.030)
pH: 5.5 (ref 5.0–8.0)

## 2015-09-28 LAB — BASIC METABOLIC PANEL
Anion gap: 6 (ref 5–15)
BUN: 6 mg/dL (ref 6–20)
CO2: 30 mmol/L (ref 22–32)
Calcium: 8.6 mg/dL — ABNORMAL LOW (ref 8.9–10.3)
Chloride: 100 mmol/L — ABNORMAL LOW (ref 101–111)
Creatinine, Ser: 0.96 mg/dL (ref 0.61–1.24)
GFR calc Af Amer: >60 mL/min (ref >60)
GFR calc non Af Amer: >60 mL/min (ref >60)
Glucose, Bld: 125 mg/dL — ABNORMAL HIGH (ref 65–99)
Potassium: 3.5 mmol/L (ref 3.5–5.1)
Sodium: 136 mmol/L (ref 135–145)

## 2015-09-28 LAB — CBC
HCT: 43.5 % (ref 39.0–52.0)
Hemoglobin: 14.2 g/dL (ref 13.0–17.0)
MCH: 31.1 pg (ref 26.0–34.0)
MCHC: 32.6 g/dL (ref 30.0–36.0)
MCV: 95.4 fL (ref 78.0–100.0)
Platelets: 196 10*3/uL (ref 150–400)
RBC: 4.56 MIL/uL (ref 4.22–5.81)
RDW: 13 % (ref 11.5–15.5)
WBC: 6.6 10*3/uL (ref 4.0–10.5)

## 2015-09-28 MED ORDER — POTASSIUM CHLORIDE CRYS ER 20 MEQ PO TBCR
20.0000 meq | EXTENDED_RELEASE_TABLET | Freq: Two times a day (BID) | ORAL | Status: AC
  Administered 2015-09-28 (×2): 20 meq via ORAL
  Filled 2015-09-28 (×2): qty 1

## 2015-09-28 MED ORDER — RIVAROXABAN 20 MG PO TABS
20.0000 mg | ORAL_TABLET | Freq: Every day | ORAL | Status: DC
  Administered 2015-09-28 – 2015-10-03 (×6): 20 mg via ORAL
  Filled 2015-09-28 (×6): qty 1

## 2015-09-28 NOTE — Progress Notes (Signed)
Pt ambulating well independently with walker.  Just requiring help maneuvering equipment (PCA, O2).  When he stands/ambulates, HR increases to 140-150s.  Comes down to 100-110s at rest.  No dyspnea or SOB.

## 2015-09-28 NOTE — Progress Notes (Addendum)
  Progress Note    09/28/2015 8:06 AM 1 Day Post-Op  Subjective:  C/o pain -- wants pain medicine at discharge in anticipation of pain  Afebrile HR 80's NSR 140's-170's systolic 94% 1LO2NC    Filed Vitals:   09/28/15 0742 09/28/15 0745  BP:    Pulse:    Temp:    Resp: 16 16    Physical Exam: Incisions:  Covered--bandage is clean and dry Extremities:  Stump elevated on pillow  CBC    Component Value Date/Time   WBC 6.6 09/28/2015 0240   WBC 6.7 06/22/2011 1506   RBC 4.56 09/28/2015 0240   RBC 4.77 06/22/2011 1506   HGB 14.2 09/28/2015 0240   HGB 16.2 06/22/2011 1506   HCT 43.5 09/28/2015 0240   HCT 46.3 06/22/2011 1506   PLT 196 09/28/2015 0240   PLT 192 06/22/2011 1506   MCV 95.4 09/28/2015 0240   MCV 97 06/22/2011 1506   MCH 31.1 09/28/2015 0240   MCH 34.0 06/22/2011 1506   MCHC 32.6 09/28/2015 0240   MCHC 34.9 06/22/2011 1506   RDW 13.0 09/28/2015 0240   RDW 12.4 06/22/2011 1506   LYMPHSABS 3.0 06/30/2015 2115   MONOABS 0.4 06/30/2015 2115   EOSABS 0.3 06/30/2015 2115   BASOSABS 0.0 06/30/2015 2115    BMET    Component Value Date/Time   NA 136 09/28/2015 0240   NA 143 06/22/2011 1506   K 3.5 09/28/2015 0240   K 3.9 06/22/2011 1506   CL 100* 09/28/2015 0240   CL 106 06/22/2011 1506   CO2 30 09/28/2015 0240   CO2 27 06/22/2011 1506   GLUCOSE 125* 09/28/2015 0240   GLUCOSE 84 06/22/2011 1506   BUN 6 09/28/2015 0240   BUN 16 06/22/2011 1506   CREATININE 0.96 09/28/2015 0240   CREATININE 1.11 06/22/2011 1506   CALCIUM 8.6* 09/28/2015 0240   CALCIUM 9.1 06/22/2011 1506   GFRNONAA >60 09/28/2015 0240   GFRNONAA >60 06/22/2011 1506   GFRAA >60 09/28/2015 0240   GFRAA >60 06/22/2011 1506    INR    Component Value Date/Time   INR 1.04 09/27/2015 0941     Intake/Output Summary (Last 24 hours) at 09/28/15 0806 Last data filed at 09/27/15 2218  Gross per 24 hour  Intake   1600 ml  Output    550 ml  Net   1050 ml     Assessment/Plan:   45 y.o. male is s/p right below knee amputation  1 Day Post-Op  -will take down dressing tomorrow -CIR has evaluated pt--await their recommendations -PT/OT today -continue PCA for now--explained to & encouraged pt that his surgical pain will improve unlike the rest pain he was having. -hypokalemia-will give supplement this am otherwise, labs ok   Doreatha MassedSamantha Rhyne, PA-C [facility] [phone] 09/28/2015 8:06 AM  Agree with above Pt also complains of some urgency and frequency on urination will check UA urine culture  Fabienne Brunsharles Vitor Overbaugh, MD [facility] [place] Office: [phone] Pager: [phone]

## 2015-09-28 NOTE — Progress Notes (Signed)
Occupational Therapy Evaluation Patient Details Name: Edward RedbirdKevin Batterman MRN: [id number] DOB: 01/27/1971 Today's Date: 09/28/2015    History of Present Illness Edward RedbirdKevin Waites is a 45 y.o. right handed male with history of CAD with stenting, DVT maintained on Xarelto, tobacco abuse, peripheral vascular disease with multiple revascularization procedures. [place]. Used a cane walker if needed. One level home 3 steps to entry. Mother can assist. Presented 09/27/2015 with resting right foot pain times several weeks with ischemic changes. Angiogram and duplex ultrasound noted to have occluded femoral popliteal bypass graft with only a small amount of tibial outflow. Limb was not felt to be salvageable. 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 Darrick PennaFields. Hospital course pain management.    Clinical Impression   PTA, pt was independent with ADLs and mobility. Pt currently requires min assist for LB ADLs and supervision for transfers due to balance deficits. Pt is very motivated to regain independence and eventually use prosthetic and feel pt will make excellent progress with CIR. Pt will benefit from continued acute OT to increase independence and safety with ADLs and mobility to allow for safe discharge to the venue listed below.     Follow Up Recommendations  CIR;Supervision/Assistance - 24 hour    Equipment Recommendations  3 in 1 bedside comode    Recommendations for Other Services       Precautions / Restrictions Precautions Precautions: Fall Restrictions Weight Bearing Restrictions: No      Mobility Bed Mobility Overal bed mobility: Modified Independent             General bed mobility comments: VCs for hand placement and to avoid pulling on RW to move to EOB  Transfers Overall transfer level: Needs assistance Equipment used: Rolling walker (2 wheeled) Transfers: Sit to/from Stand Sit to Stand: Supervision         General transfer  comment: Supervision for safety. VCs for safe hand placement as pt attempting to pull up on RW.    Balance Overall balance assessment: Needs assistance Sitting-balance support: No upper extremity supported;Feet supported Sitting balance-Leahy Scale: Good     Standing balance support: Bilateral upper extremity supported;During functional activity Standing balance-Leahy Scale: Poor Standing balance comment: reliant on UE support for balance             High level balance activites: Direction changes;Turns;Sudden stops High Level Balance Comments: min guard assist  with above.            ADL Overall ADL's : Needs assistance/impaired Eating/Feeding: Set up;Sitting       Upper Body Bathing: Set up;Sitting   Lower Body Bathing: Minimal assistance;Sit to/from stand   Upper Body Dressing : Set up;Sitting   Lower Body Dressing: Minimal assistance;Sit to/from stand   Toilet Transfer: Min guard;Cueing for safety;Ambulation;BSC;RW   Toileting- ArchitectClothing Manipulation and Hygiene: Min guard;Sit to/from stand       Functional mobility during ADLs: Min guard;Rolling walker       Vision Vision Assessment?: No apparent visual deficits   Perception     Praxis      Pertinent Vitals/Pain Pain Assessment: Faces Pain Score:  (pt reports he had some pain getting up but could not rate) Faces Pain Scale: Hurts a little bit Pain Location: R residual limb with mvoement, 10/10 pain when sitting in chair with R knee bent Pain Descriptors / Indicators: Throbbing Pain Intervention(s): Limited activity within patient's tolerance;Monitored during session;Repositioned     Hand Dominance Right   Extremity/Trunk Assessment Upper  Extremity Assessment Upper Extremity Assessment: Overall WFL for tasks assessed   Lower Extremity Assessment Lower Extremity Assessment: Defer to PT evaluation RLE: Unable to fully assess due to pain   Cervical / Trunk Assessment Cervical / Trunk  Assessment: Normal   Communication Communication Communication: No difficulties   Cognition Arousal/Alertness: Awake/alert Behavior During Therapy: Flat affect Overall Cognitive Status: Within Functional Limits for tasks assessed                     General Comments       Exercises Exercises: General Lower Extremity     Shoulder Instructions      Home Living Family/patient expects to be discharged to:: Private residence Living Arrangements: Alone Available Help at Discharge: Family;Available 24 hours/day (mom, sister, and friend) Type of Home: Mobile home Home Access: Stairs to enter Entrance Stairs-Number of Steps: 3 Entrance Stairs-Rails: Can reach both;Left;Right Home Layout: One level     Bathroom Shower/Tub: Producer, television/film/video: Standard Bathroom Accessibility: Yes   Home Equipment: Environmental consultant - 2 wheels;Crutches;Shower seat;Adaptive equipment Adaptive Equipment: Reacher;Sock aid        Prior Functioning/Environment Level of Independence: Independent             OT Diagnosis: Generalized weakness;Acute pain   OT Problem List: Decreased strength;Decreased range of motion;Decreased activity tolerance;Impaired balance (sitting and/or standing);Decreased safety awareness;Decreased knowledge of use of DME or AE;Decreased knowledge of precautions;Pain   OT Treatment/Interventions: Therapeutic exercise;Self-care/ADL training;Energy conservation;DME and/or AE instruction;Therapeutic activities;Patient/family education;Balance training    OT Goals(Current goals can be found in the care plan section) Acute Rehab OT Goals Patient Stated Goal: to go home with help OT Goal Formulation: With patient Time For Goal Achievement: 10/12/15 Potential to Achieve Goals: Good ADL Goals Pt Will Perform Grooming: with modified independence;sitting Pt Will Perform Lower Body Bathing: with modified independence;sit to/from stand Pt Will Perform Lower Body  Dressing: with modified independence;sit to/from stand Pt Will Transfer to Toilet: with modified independence;ambulating;bedside commode (over toilet) Pt Will Perform Toileting - Clothing Manipulation and hygiene: with modified independence;sit to/from stand;sitting/lateral leans  OT Frequency: Min 2X/week   Barriers to D/C:            Co-evaluation PT/OT/SLP Co-Evaluation/Treatment: Yes Reason for Co-Treatment: For patient/therapist safety PT goals addressed during session: Mobility/safety with mobility OT goals addressed during session: ADL's and self-care;Proper use of Adaptive equipment and DME      End of Session Equipment Utilized During Treatment: Gait belt;Rolling walker Nurse Communication: Mobility status  Activity Tolerance: Patient tolerated treatment well Patient left: in chair;with call bell/phone within reach;with family/visitor present   Time: 9562-1308 OT Time Calculation (min): 15 min Charges:  OT General Charges $OT Visit: 1 Procedure OT Evaluation $OT Eval Moderate Complexity: 1 Procedure G-Codes:    Nils Pyle, OTR/L Pager: [phone] 09/28/2015, 1:27 PM

## 2015-09-28 NOTE — Progress Notes (Signed)
Inpatient Rehabilitation  I met with the patient and his mother and sister at the bedside to discuss the recommendation for CIR.  I provided informational booklets and answered their questions.  Pt. Is agreeable with short IP rehab admission if approved by his [employer] insurance.  I have initiated the insurance authorization process.  Danne Baxter will follow up tomorrow in my absence and can be reached at [phone].  [facility] Admissions Coordinator Cell [phone] Office [phone]

## 2015-09-28 NOTE — H&P (Signed)
Pt reported difficulty starting stream when urinating. Per pt. It started 8 - 9 days ago. Denies any pain.

## 2015-09-28 NOTE — Telephone Encounter (Signed)
-----   Message from Sharee PimpleMarilyn K McChesney, RN sent at 09/27/2015  3:44 PM EDT ----- Regarding: schedule   ----- Message -----    From: Dara LordsSamantha J Rhyne, PA-C    Sent: 09/27/2015   3:10 PM      To: [facility] Charge Pool  S/p right BKA 09/27/15.  F/u in 4 weeks with Dr. Darrick PennaFields  Thanks, Lelon MastSamantha

## 2015-09-28 NOTE — Consult Note (Signed)
Physical Medicine and Rehabilitation Consult Reason for Consult: Right BKA Referring Physician: Dr. Darrick Penna   HPI: Edward [name] is a 45 y.o. right handed male with history of CAD with stenting, DVT maintained on Xarelto, tobacco abuse, peripheral vascular disease with multiple revascularization procedures. Patient lives alone in [place]. Used a cane walker if needed. One level home 3 steps to entry. Mother can assist. Presented 09/27/2015 with resting right foot pain times several weeks with ischemic changes. Angiogram and duplex ultrasound noted to have occluded femoral popliteal bypass graft with only a small amount of tibial outflow. Limb was not felt to be salvageable. 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 Darrick Penna. Hospital course pain management. Await plans to resume Xarelto. Physical and occupational therapy evaluations pending. M.D. has requested physical medicine rehabilitation consult  Did not sleep well last night, using PCA Fem pop was in March used cane for 3 wks  Review of Systems  Constitutional: Negative for fever and chills.  HENT: Negative for hearing loss.   Eyes: Negative for blurred vision and double vision.  Respiratory: Negative for cough.        Shortness of breath with exertion  Cardiovascular: Positive for palpitations and leg swelling. Negative for chest pain.  Gastrointestinal: Positive for constipation. Negative for nausea and vomiting.  Genitourinary: Negative for dysuria and hematuria.  Musculoskeletal: Positive for myalgias and joint pain.  Skin: Negative for rash.  Neurological: Positive for dizziness. Negative for seizures, weakness and headaches.  Psychiatric/Behavioral: Positive for depression.  All other systems reviewed and are negative.  Past Medical History  Diagnosis Date  . Pneumothorax 2006    s/p chest tube placement x 2   . Emphysema   . DVT (deep venous thrombosis) (HCC)     right leg-takes Xarelto daily  bridging with Lovenox for surgery  . Coronary artery disease   . Peripheral vascular disease (HCC)   . Myocardial infarction (HCC) 2012  . Pulmonary embolism (HCC)   . Emphysema lung (HCC)   . Shortness of breath dyspnea     with exertion  . History of bronchitis   . Vertigo     rarely but doesn't take any meds  . Peripheral neuropathy (HCC)     takes Gabapentin daily  . History of gout     not on any meds  . Depression     not on any meds  . Collagen vascular disease (HCC)   . Dizziness 07-16-2015   Past Surgical History  Procedure Laterality Date  . Chest tube insertion    . Peripheral vascular catheterization N/A 05/18/2015    Procedure: Abdominal Aortogram;  Surgeon: Nada Libman, MD;  Location: [facility] INVASIVE CV [facility];  Service: Cardiovascular;  Laterality: N/A;  . Cardiac catheterization  2012    s/p stent placement  . Coronary angioplasty with stent placement      1 stent  . Hernia repair Right     inguinal  . Femoral-popliteal bypass graft Right 05/30/2015    Procedure: RIGHT SUPERFICIAL FEMORAL TO BELOW KNEE POPLITEAL ARTERY BYPASS GRAFT;  Surgeon: Sherren Kerns, MD;  Location: [facility] OR;  Service: Vascular;  Laterality: Right;  . Vein harvest Right 05/30/2015    Procedure: RIGHT GREAT SAPHENOUS VEIN HARVEST;  Surgeon: Sherren Kerns, MD;  Location: [facility] OR;  Service: Vascular;  Laterality: Right;  . Intraoperative arteriogram Right 05/30/2015    Procedure: INTRA OPERATIVE ARTERIOGRAM;  Surgeon: Sherren Kerns, MD;  Location: [facility] OR;  Service: Vascular;  Laterality: Right;  . Femoral-popliteal bypass graft Right 05/30/2015    Procedure: RIGHT BYPASS GRAFT FEMORAL-POPLITEAL ARTERY USING PROPATEN 556mmx80cm  GRAFT; WITH VEIN CUFF;  Surgeon: Sherren Kernsharles E Fields, MD;  [facility];  Service: Vascular;  Laterality: Right;  . Intraoperative arteriogram Right 05/30/2015    Procedure: INTRA OPERATIVE ARTERIOGRAM;  Surgeon: Sherren Kernsharles E Fields, MD;  [facility];  Service: Vascular;   Laterality: Right;  . Femoral-popliteal bypass graft Right 07/04/2015    Procedure: REDO RIGHT FEMORAL TO BELOW KNEE POPLITEAL BYPASS GRAFT USING CONTRALATERAL  REVERSED GREATER SAPHENOUS VEIN;  Surgeon: Sherren Kernsharles E Fields, MD;  [facility];  Service: Vascular;  Laterality: Right;  . Vein harvest Left 07/04/2015    Procedure: VEIN HARVEST LEFT GREATER SAPHENOUS ;  Surgeon: Sherren Kernsharles E Fields, MD;  [facility];  Service: Vascular;  Laterality: Left;  . Intraoperative arteriogram  07/04/2015    Procedure: INTRA OPERATIVE ARTERIOGRAM LEFT LOWER EXTREMITY;  Surgeon: Sherren Kernsharles E Fields, MD;  [facility];  Service: Vascular;;  . Thrombectomy femoral artery Left 07/04/2015    Procedure: THROMBECTOMY LEFT  TIBIAL ARTERY;  Surgeon: Sherren Kernsharles E Fields, MD;  [facility];  Service: Vascular;  Laterality: Left;  . Below knee leg amputation Right 09/27/2015   Family History  Problem Relation Age of Onset  . Family history unknown: Yes   Social History:  reports that he has been smoking Cigarettes.  He has a 25 pack-year smoking history. He has never used smokeless tobacco. He reports that he does not drink alcohol or use illicit drugs. Allergies:  Allergies  Allergen Reactions  . Lisinopril Other (See Comments)    HYPOTENSION   Medications Prior to Admission  Medication Sig Dispense Refill  . DULoxetine (CYMBALTA) 30 MG capsule Take 30 mg by mouth daily.    Marland Kitchen. gabapentin (NEURONTIN) 300 MG capsule Take 300 mg by mouth 3 (three) times daily.    . methylPREDNISolone (MEDROL DOSEPAK) 4 MG TBPK tablet Take 4-8 mg by mouth See admin instructions. Day 1: 8 mg PO before breakfast, 4 mg after lunch and after dinner, and 8 mg at bedtime  Day 2: 4 mg PO before breakfast, after lunch, and after dinner and 8 mg at bedtime Day 3: 4 mg PO before breakfast, after lunch, after dinner, and at bedtime Day 4: 4 mg PO before breakfast, after lunch, and at bedtime  Day 5: 4 mg PO before breakfast and at bedtime Day  6: 4 mg PO before breakfast    . rivaroxaban (XARELTO) 20 MG TABS tablet Take 20 mg by mouth daily with lunch.     . nitroGLYCERIN (NITROSTAT) 0.4 MG SL tablet Place 0.4 mg under the tongue every 5 (five) minutes as needed for chest pain.       Home: Home Living Family/patient expects to be discharged to:: Unsure Living Arrangements: Alone  Functional History:   Functional Status:  Mobility:          ADL:    Cognition: Cognition Orientation Level: Oriented X4    Blood pressure 143/98, pulse 89, temperature 98.6 F (37 C), temperature source Oral, resp. rate 18, height 5\' 6"  (1.676 m), weight 55.339 kg (122 lb), SpO2 96 %. Physical Exam  Constitutional: He is oriented to person, place, and time. He appears well-developed.  HENT:  Head: Normocephalic.  Eyes: EOM are normal.  Neck: Normal range of motion. Neck supple. No thyromegaly present.  Cardiovascular: Normal rate and regular rhythm.  Respiratory: Effort normal and breath sounds normal. No respiratory distress.  GI: Soft. Bowel sounds are normal. He exhibits no distension.  Neurological: He is alert and oriented to person, place, and time.  Skin:  BKA site is dressed appropriately tender  Patient is able to partially flex and extend the right knee. There is moderate pain with this.  Results for orders placed or performed during the hospital encounter of 09/27/15 (from the past 24 hour(s))  PT- INR Day of Surgery     Status: None   Collection Time: 09/27/15  9:41 AM  Result Value Ref Range   Prothrombin Time 13.8 11.6 - 15.2 seconds   INR 1.04 0.00 - 1.49  CBC     Status: None   Collection Time: 09/27/15  9:41 AM  Result Value Ref Range   WBC 6.8 4.0 - 10.5 K/uL   RBC 4.85 4.22 - 5.81 MIL/uL   Hemoglobin 15.6 13.0 - 17.0 g/dL   HCT 91.4 78.2 - 95.6 %   MCV 95.1 78.0 - 100.0 fL   MCH 32.2 26.0 - 34.0 pg   MCHC 33.8 30.0 - 36.0 g/dL   RDW 21.3 08.6 - 57.8 %   Platelets 188 150 - 400 K/uL  Comprehensive  metabolic panel     Status: Abnormal   Collection Time: 09/27/15  9:41 AM  Result Value Ref Range   Sodium 139 135 - 145 mmol/L   Potassium 4.3 3.5 - 5.1 mmol/L   Chloride 102 101 - 111 mmol/L   CO2 30 22 - 32 mmol/L   Glucose, Bld 90 65 - 99 mg/dL   BUN 11 6 - 20 mg/dL   Creatinine, Ser 4.69 0.61 - 1.24 mg/dL   Calcium 9.2 8.9 - 62.9 mg/dL   Total Protein 6.2 (L) 6.5 - 8.1 g/dL   Albumin 3.7 3.5 - 5.0 g/dL   AST 14 (L) 15 - 41 U/L   ALT 15 (L) 17 - 63 U/L   Alkaline Phosphatase 94 38 - 126 U/L   Total Bilirubin 0.4 0.3 - 1.2 mg/dL   GFR calc non Af Amer >60 >60 mL/min   GFR calc Af Amer >60 >60 mL/min   Anion gap 7 5 - 15  Basic metabolic panel     Status: Abnormal   Collection Time: 09/28/15  2:40 AM  Result Value Ref Range   Sodium 136 135 - 145 mmol/L   Potassium 3.5 3.5 - 5.1 mmol/L   Chloride 100 (L) 101 - 111 mmol/L   CO2 30 22 - 32 mmol/L   Glucose, Bld 125 (H) 65 - 99 mg/dL   BUN 6 6 - 20 mg/dL   Creatinine, Ser 5.28 0.61 - 1.24 mg/dL   Calcium 8.6 (L) 8.9 - 10.3 mg/dL   GFR calc non Af Amer >60 >60 mL/min   GFR calc Af Amer >60 >60 mL/min   Anion gap 6 5 - 15  CBC     Status: None   Collection Time: 09/28/15  2:40 AM  Result Value Ref Range   WBC 6.6 4.0 - 10.5 K/uL   RBC 4.56 4.22 - 5.81 MIL/uL   Hemoglobin 14.2 13.0 - 17.0 g/dL   HCT 41.3 24.4 - 01.0 %   MCV 95.4 78.0 - 100.0 fL   MCH 31.1 26.0 - 34.0 pg   MCHC 32.6 30.0 - 36.0 g/dL   RDW 27.2 53.6 - 64.4 %   Platelets 196 150 - 400 K/uL   No results found.  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 Does the need for close, 24 hr/day medical supervision in concert with the patient's rehab needs make it unreasonable for this patient to be served in a less intensive setting? Yes 2. Co-Morbidities requiring supervision/potential complications: Coronary artery disease, history of DVT, peripheral vascular disease status post occluded femoropopliteal bypass  on the right side 3. Due to bladder management, bowel management, safety, skin/wound care, disease management, medication administration, pain management and patient education, does the patient require 24 hr/day rehab nursing? Potentially 4. Does the patient require coordinated care of a physician, rehab nurse, PT, OT to address physical and functional deficits in the context of the above medical diagnosis(es)? Potentially Addressing deficits in the following areas: balance, endurance, locomotion, strength, transferring, bowel/bladder control, bathing, dressing, feeding, grooming and toileting 5. Can the patient actively participate in an intensive therapy program of at least 3 hrs of therapy per day at least 5 days per week? Yes 6. The potential for patient to make measurable gains while on inpatient rehab is good 7. Anticipated functional outcomes upon discharge from inpatient rehab are modified independent  with PT, modified independent with OT, n/a with SLP. 8. Estimated rehab length of stay to reach the above functional goals is: 5-7 days 9. Does the patient have adequate social supports and living environment to accommodate these discharge functional goals? Yes 10. Anticipated D/C setting: Home 11. Anticipated post D/C treatments: HH therapy 12. Overall Rehab/Functional Prognosis: excellent  RECOMMENDATIONS: This patient's condition is appropriate for continued rehabilitative care in the following setting: CIR if  still requiring physical assist at postop day #3 Patient has agreed to participate in recommended program. Yes Note that insurance prior authorization may be required for reimbursement for recommended care.  Comment: Has not been seen by PT or OT postoperatively    09/28/2015

## 2015-09-28 NOTE — Telephone Encounter (Signed)
Sched appt 7/13 at 2:15. Ph# has no vm, mailed appt letter through regular mail to inform pt.

## 2015-09-28 NOTE — Evaluation (Signed)
Physical Therapy Evaluation Patient Details Name: Edward [name] MRN: [id number] DOB: 26-Jul-1970 Today's Date: 09/28/2015   History of Present Illness  Clanton Emanuelson is a 45 y.o. right handed male with history of CAD with stenting, DVT maintained on Xarelto, tobacco abuse, peripheral vascular disease with multiple revascularization procedures. Patient lives alone in [place]. Used a cane walker if needed. One level home 3 steps to entry. Mother can assist. Presented 09/27/2015 with resting right foot pain times several weeks with ischemic changes. Angiogram and duplex ultrasound noted to have occluded femoral popliteal bypass graft with only a small amount of tibial outflow. Limb was not felt to be salvageable. 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 Darrick Penna. Hospital course pain management.   Clinical Impression  Pt admitted with above diagnosis. Pt currently with functional limitations due to the deficits listed below (see PT Problem List). Pt was able to ambulate with RW around the bed.  Should progress well.  Brief rehab stay could incr pts independence.   Pt will benefit from skilled PT to increase their independence and safety with mobility to allow discharge to the venue listed below.      Follow Up Recommendations CIR;Supervision/Assistance - 24 hour    Equipment Recommendations  3in1 (PT)    Recommendations for Other Services Rehab consult     Precautions / Restrictions Precautions Precautions: Fall Restrictions Weight Bearing Restrictions: No      Mobility  Bed Mobility Overal bed mobility: Independent                Transfers Overall transfer level: Needs assistance Equipment used: Rolling walker (2 wheeled) Transfers: Sit to/from Stand Sit to Stand: Supervision         General transfer comment: Needed cues for hand placement as pt wanting to pull up on RW.   Ambulation/Gait Ambulation/Gait assistance: Min guard;+2  safety/equipment Ambulation Distance (Feet): 40 Feet Assistive device: Rolling walker (2 wheeled) Gait Pattern/deviations: Step-to pattern;Decreased stride length   Gait velocity interpretation: Below normal speed for age/gender General Gait Details: Pt able to ambulate with RW with good safety overall.  Good technique with RW.  Safe maneuvering around obstacles.     Stairs            Wheelchair Mobility    Modified Rankin (Stroke Patients Only)       Balance Overall balance assessment: Needs assistance;History of Falls Sitting-balance support: No upper extremity supported;Feet supported Sitting balance-Leahy Scale: Good     Standing balance support: Bilateral upper extremity supported;During functional activity Standing balance-Leahy Scale: Poor Standing balance comment: relies on RW for balance.              High level balance activites: Direction changes;Turns;Sudden stops High Level Balance Comments: min guard assist  with above.             Pertinent Vitals/Pain Pain Assessment: No/denies pain Pain Score:  (pt reports he had some pain getting up but could not rate)  97% on 1LO2    Home Living Family/patient expects to be discharged to:: Private residence Living Arrangements: Alone Available Help at Discharge: Family;Available 24 hours/day (mom, sister and friend) Type of Home: Mobile home Home Access: Stairs to enter Entrance Stairs-Rails: Can reach both;Left;Right Entrance Stairs-Number of Steps: 3 Home Layout: One level Home Equipment: Crutches;Shower seat;Adaptive equipment;Walker - 2 wheels      Prior Function Level of Independence: Independent with assistive device(s)  Hand Dominance   Dominant Hand: Right    Extremity/Trunk Assessment   Upper Extremity Assessment: Defer to OT evaluation           Lower Extremity Assessment: Generalized weakness;RLE deficits/detail      Cervical / Trunk Assessment: Normal   Communication   Communication: No difficulties  Cognition Arousal/Alertness: Awake/alert Behavior During Therapy: Flat affect Overall Cognitive Status: Within Functional Limits for tasks assessed                      General Comments      Exercises General Exercises - Lower Extremity Long Arc Quad: AROM;Right;5 reps;Seated      Assessment/Plan    PT Assessment Patient needs continued PT services  PT Diagnosis Generalized weakness;Acute pain   PT Problem List Decreased activity tolerance;Decreased balance;Decreased mobility;Decreased knowledge of use of DME;Decreased safety awareness;Decreased knowledge of precautions;Pain  PT Treatment Interventions DME instruction;Gait training;Stair training;Functional mobility training;Therapeutic activities;Therapeutic exercise;Balance training;Patient/family education   PT Goals (Current goals can be found in the Care Plan section) Acute Rehab PT Goals Patient Stated Goal: to go home with help PT Goal Formulation: With patient Time For Goal Achievement: 10/12/15 Potential to Achieve Goals: Good    Frequency Min 5X/week   Barriers to discharge        Co-evaluation PT/OT/SLP Co-Evaluation/Treatment: Yes Reason for Co-Treatment: For patient/therapist safety PT goals addressed during session: Mobility/safety with mobility         End of Session Equipment Utilized During Treatment: Gait belt Activity Tolerance: Patient limited by fatigue Patient left: in chair;with call bell/phone within reach;with family/visitor present Nurse Communication: Mobility status         Time: [id number] PT Time Calculation (min) (ACUTE ONLY): 14 min   Charges:   PT Evaluation $PT Eval Moderate Complexity: 1 Procedure     PT G CodesTawni Millers:        Caley Volkert F 09/28/2015, 12:55 PM Bunyan Brier,PT Acute Rehabilitation [phone] (pager)

## 2015-09-29 LAB — CBC
HCT: 43.5 % (ref 39.0–52.0)
Hemoglobin: 13.7 g/dL (ref 13.0–17.0)
MCH: 30.9 pg (ref 26.0–34.0)
MCHC: 31.5 g/dL (ref 30.0–36.0)
MCV: 98.2 fL (ref 78.0–100.0)
Platelets: 191 10*3/uL (ref 150–400)
RBC: 4.43 MIL/uL (ref 4.22–5.81)
RDW: 13.1 % (ref 11.5–15.5)
WBC: 9.8 10*3/uL (ref 4.0–10.5)

## 2015-09-29 LAB — URINE CULTURE: Culture: NO GROWTH

## 2015-09-29 LAB — BASIC METABOLIC PANEL
Anion gap: 7 (ref 5–15)
BUN: 6 mg/dL (ref 6–20)
CO2: 30 mmol/L (ref 22–32)
Calcium: 8.5 mg/dL — ABNORMAL LOW (ref 8.9–10.3)
Chloride: 98 mmol/L — ABNORMAL LOW (ref 101–111)
Creatinine, Ser: 1.06 mg/dL (ref 0.61–1.24)
GFR calc Af Amer: >60 mL/min (ref >60)
GFR calc non Af Amer: >60 mL/min (ref >60)
Glucose, Bld: 128 mg/dL — ABNORMAL HIGH (ref 65–99)
Potassium: 4.8 mmol/L (ref 3.5–5.1)
Sodium: 135 mmol/L (ref 135–145)

## 2015-09-29 MED ORDER — CYCLOBENZAPRINE HCL 10 MG PO TABS
10.0000 mg | ORAL_TABLET | Freq: Three times a day (TID) | ORAL | Status: DC | PRN
  Administered 2015-09-29 – 2015-10-03 (×6): 10 mg via ORAL
  Filled 2015-09-29 (×6): qty 1

## 2015-09-29 NOTE — Progress Notes (Signed)
Physical Therapy Treatment Patient Details Name: Edward [name] MRN: [id number] DOB: 1971/03/27 Today's Date: 09/29/2015    History of Present Illness Edward [name] is a 45 y.o. right handed male with history of CAD with stenting, DVT maintained on Xarelto, tobacco abuse, peripheral vascular disease with multiple revascularization procedures. Patient lives alone in [place]. Used a cane walker if needed. One level home 3 steps to entry. Mother can assist. Presented 09/27/2015 with resting right foot pain times several weeks with ischemic changes. Angiogram and duplex ultrasound noted to have occluded femoral popliteal bypass graft with only a small amount of tibial outflow. Limb was not felt to be salvageable. 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 Darrick Penna. Hospital course pain management.     PT Comments    Pt admitted with above diagnosis. Pt currently with functional limitations due to strength, balance and endurance deficits. Pt refused to get OOB due to has been having spasms.  Pt did perform quad sets x 10 reps.  Discussed precautions for right LE as well.  Pt in pain and asking for more medication (has PCA).  Notified nursing that pt wanted more pain medication.  Pt will benefit from skilled PT to increase their independence and safety with mobility to allow discharge to the venue listed below.    Follow Up Recommendations  CIR;Supervision/Assistance - 24 hour     Equipment Recommendations  3in1 (PT)    Recommendations for Other Services Rehab consult     Precautions / Restrictions Precautions Precautions: Fall Restrictions Weight Bearing Restrictions: No    Mobility  Bed Mobility                  Transfers                    Ambulation/Gait                 Stairs            Wheelchair Mobility    Modified Rankin (Stroke Patients Only)       Balance                                    Cognition  Arousal/Alertness: Awake/alert Behavior During Therapy: Flat affect Overall Cognitive Status: Within Functional Limits for tasks assessed                      Exercises Amputee Exercises Quad Sets: AROM;Both;10 reps;Supine    General Comments        Pertinent Vitals/Pain Pain Assessment: Faces Faces Pain Scale: Hurts whole lot Pain Location: right residual limb Pain Descriptors / Indicators: Aching;Throbbing Pain Intervention(s): Limited activity within patient's tolerance;Monitored during session;Repositioned;Premedicated before session;PCA encouraged  VSS    Home Living                      Prior Function            PT Goals (current goals can now be found in the care plan section) Progress towards PT goals: Progressing toward goals    Frequency  Min 5X/week    PT Plan Current plan remains appropriate    Co-evaluation             End of Session Equipment Utilized During Treatment: Gait belt Activity Tolerance: Patient limited by fatigue Patient left: in bed;with call bell/phone within reach;with family/visitor present  Time: [id number] PT Time Calculation (min) (ACUTE ONLY): 8 min  Charges:  $Therapeutic Exercise: 8-22 mins                    G CodesBerline Lopes:      Lis Savitt F 09/29/2015, 3:39 PM [employer] CorporationDawn Pavlos Yon,PT Acute Rehabilitation [phone] (pager)

## 2015-09-29 NOTE — Progress Notes (Signed)
I met with pt, his Mom and sister at bedside. Pt remains on PCA and very sleepy during my visit. I await Insurance approval to admit pt to inpt rehab once pt off PCA. Mom is requesting information about disability and medicaid as his work is shutting down at the end of the month and pt will be uninsured. I contacted financial counselor at [phone] and they state Mom will have to seek disability and Medicaid applications with [employer]. I will make her aware. We will follow up tomorrow. [phone]

## 2015-09-29 NOTE — Progress Notes (Signed)
As a referral from the nurse I met with the patient, his mother and sister at bedside while rounding. I introduced myself as the unit [facility] and as such was a part of his health care here at [facility]. After a few minutes of light conversation the mother and sister left the room so that the patient and I could talk. The patient is in depression that began a couple of years ago when he developed pain in his right leg. The depression is greatly enhanced due to the recent amputation of that same leg below the knee. We explored his depression. He feels imprisoned and desires to be set free. He seeks peace of mind. I encouraged the patient to picture what "being set free" would look and feel like. I also invited him to imagine what the first step towards that freedom would be. The patient was receptive to exploring and is open to a follow up. I offered prayer and will follow up with the patient on my next round.  Darene Lamer Angle Karel 11:48 AM    09/29/15 1100  Clinical Encounter Type  Visited With Patient;Family;Patient and family together  Visit Type Initial;Psychological support;Spiritual support;Post-op  Referral From Nurse  Spiritual Encounters  Spiritual Needs Prayer;Emotional;Grief support  Stress Factors  Patient Stress Factors Exhausted;Health changes;Loss of control;Major life changes

## 2015-09-29 NOTE — Progress Notes (Addendum)
  Progress Note    09/29/2015 8:47 AM 2 Days Post-Op  Subjective:  Pain is a little bit better today.  Has not been sleeping well.  Afebrile HR  110's-120's ST 120's-130's systolic 92% 2LO2NC    Filed Vitals:   09/29/15 0553 09/29/15 0800  BP: 139/87   Pulse: 116   Temp: 98.4 F (36.9 C)   Resp: 16 17    Physical Exam: Incisions:  Clean and dry with staples in tact.  Extremities:  Slow movement of right leg, but moving it.  CBC    Component Value Date/Time   WBC 9.8 09/29/2015 0351   WBC 6.7 06/22/2011 1506   RBC 4.43 09/29/2015 0351   RBC 4.77 06/22/2011 1506   HGB 13.7 09/29/2015 0351   HGB 16.2 06/22/2011 1506   HCT 43.5 09/29/2015 0351   HCT 46.3 06/22/2011 1506   PLT 191 09/29/2015 0351   PLT 192 06/22/2011 1506   MCV 98.2 09/29/2015 0351   MCV 97 06/22/2011 1506   MCH 30.9 09/29/2015 0351   MCH 34.0 06/22/2011 1506   MCHC 31.5 09/29/2015 0351   MCHC 34.9 06/22/2011 1506   RDW 13.1 09/29/2015 0351   RDW 12.4 06/22/2011 1506   LYMPHSABS 3.0 06/30/2015 2115   MONOABS 0.4 06/30/2015 2115   EOSABS 0.3 06/30/2015 2115   BASOSABS 0.0 06/30/2015 2115    BMET    Component Value Date/Time   NA 135 09/29/2015 0351   NA 143 06/22/2011 1506   K 4.8 09/29/2015 0351   K 3.9 06/22/2011 1506   CL 98* 09/29/2015 0351   CL 106 06/22/2011 1506   CO2 30 09/29/2015 0351   CO2 27 06/22/2011 1506   GLUCOSE 128* 09/29/2015 0351   GLUCOSE 84 06/22/2011 1506   BUN 6 09/29/2015 0351   BUN 16 06/22/2011 1506   CREATININE 1.06 09/29/2015 0351   CREATININE 1.11 06/22/2011 1506   CALCIUM 8.5* 09/29/2015 0351   CALCIUM 9.1 06/22/2011 1506   GFRNONAA >60 09/29/2015 0351   GFRNONAA >60 06/22/2011 1506   GFRAA >60 09/29/2015 0351   GFRAA >60 06/22/2011 1506    INR    Component Value Date/Time   INR 1.04 09/27/2015 0941     Intake/Output Summary (Last 24 hours) at 09/29/15 0847 Last data filed at 09/28/15 1857  Gross per 24 hour  Intake    240 ml  Output    1175 ml  Net   -935 ml     Assessment/Plan:  45 y.o. male is s/p right below knee amputation  2 Days Post-Op  -stump is viable -pt's pain a little better today-still requiring PCA -CIR consulted and hopefully his insurance will be approved -(left stump unwrapped as Dr. Darrick PennaFields will be by in a few minutes)   Doreatha MassedSamantha Rhyne, PA-C [facility] [phone] 09/29/2015 8:47 AM  Agree with above.  BKA healing so far.  Some edema Hopefully off PCA next 1-2 days  Fabienne Brunsharles Carrol Bondar, MD [facility] [place] Office: [phone] Pager: [phone]

## 2015-09-30 MED ORDER — NICOTINE 21 MG/24HR TD PT24
21.0000 mg | MEDICATED_PATCH | Freq: Every day | TRANSDERMAL | Status: DC
  Administered 2015-09-30 – 2015-10-03 (×4): 21 mg via TRANSDERMAL
  Filled 2015-09-30 (×4): qty 1

## 2015-09-30 MED ORDER — ALPRAZOLAM 0.25 MG PO TABS
0.2500 mg | ORAL_TABLET | Freq: Three times a day (TID) | ORAL | Status: DC | PRN
  Administered 2015-09-30: 0.25 mg via ORAL
  Filled 2015-09-30: qty 1

## 2015-09-30 NOTE — Progress Notes (Addendum)
  [facility] Progress Note  Subjective  - POD #3  Pain about the same. Feels weak.   Objective Filed Vitals:   09/30/15 0358 09/30/15 0451  BP:  131/80  Pulse:  115  Temp:  99.3 F (37.4 C)  Resp: 18 18    Intake/Output Summary (Last 24 hours) at 09/30/15 0742 Last data filed at 09/29/15 1230  Gross per 24 hour  Intake    360 ml  Output    450 ml  Net    -90 ml   Right BKA incision clean and intact. No drainage. Some edema right stump.    Assessment/Planning: 45 y.o. male is s/p: right BKA 3 Days Post-Op   Pain not improving. He does say this pain is better than before surgery.  Will continue PCA. Says po meds are not working for him. Emphasized that he needs to wean off of PCA. Needs to work with PT. Hopefully off PCA soon so he can go to CIR.  Raymond GurneyKimberly A Trinh 09/30/2015 7:42 AM --  Laboratory CBC    Component Value Date/Time   WBC 9.8 09/29/2015 0351   WBC 6.7 06/22/2011 1506   HGB 13.7 09/29/2015 0351   HGB 16.2 06/22/2011 1506   HCT 43.5 09/29/2015 0351   HCT 46.3 06/22/2011 1506   PLT 191 09/29/2015 0351   PLT 192 06/22/2011 1506    BMET    Component Value Date/Time   NA 135 09/29/2015 0351   NA 143 06/22/2011 1506   K 4.8 09/29/2015 0351   K 3.9 06/22/2011 1506   CL 98* 09/29/2015 0351   CL 106 06/22/2011 1506   CO2 30 09/29/2015 0351   CO2 27 06/22/2011 1506   GLUCOSE 128* 09/29/2015 0351   GLUCOSE 84 06/22/2011 1506   BUN 6 09/29/2015 0351   BUN 16 06/22/2011 1506   CREATININE 1.06 09/29/2015 0351   CREATININE 1.11 06/22/2011 1506   CALCIUM 8.5* 09/29/2015 0351   CALCIUM 9.1 06/22/2011 1506   GFRNONAA >60 09/29/2015 0351   GFRNONAA >60 06/22/2011 1506   GFRAA >60 09/29/2015 0351   GFRAA >60 06/22/2011 1506    COAG Lab Results  Component Value Date   INR 1.04 09/27/2015   INR 1.42 06/30/2015   INR 1.02 05/26/2015   No results found for: PTT  Antibiotics Anti-infectives    Start     Dose/Rate Route  Frequency Ordered Stop   09/27/15 2200  cefUROXime (ZINACEF) 1.5 g in dextrose 5 % 50 mL IVPB     1.5 g 100 mL/hr over 30 Minutes Intravenous Every 12 hours 09/27/15 1636 09/28/15 1133   09/27/15 1030  cefUROXime (ZINACEF) 1.5 g in dextrose 5 % 50 mL IVPB     1.5 g 100 mL/hr over 30 Minutes Intravenous To [facility] 09/26/15 0932 09/27/15 1358       Maris BergerKimberly Trinh, PA-C [facility] Office: [phone] Pager: [phone] 09/30/2015 7:42 AM

## 2015-09-30 NOTE — Progress Notes (Signed)
Inpatient Rehabilitation  I have received an insurance denial from pt's [employer] for IP Rehab. I discussed with the rehab MD.  He stated that he feels pt. will be able to progress well when his  pain level modulates.   I have informed the patient and his mother of the denial.  I have also notified Nilda SimmerKrisit Webster, [facility] and she says she will update the CSW.  I will sign off.  Please call if questions.  Weldon PickingSusan Jahlon Baines PT Inpatient Rehab Admissions Coordinator Cell [phone] Office [phone]

## 2015-09-30 NOTE — [place] FL2 (Signed)
[place] MEDICAID FL2 LEVEL OF CARE SCREENING TOOL     IDENTIFICATION  Patient Name: Edward [name] Birthdate: 23-Apr-1970 Sex: male Admission Date (Current Location): 09/27/2015  [facility] [place]   Facility and Address:  [facility], [address], [place], [place] [address]      Provider Number: [id number]  Attending Physician Name and Address:  Sherren Kernsharles E Fields, MD  Relative Name and Phone Number:       Current Level of Care: Hospital Recommended Level of Care: Assisted Living Facility Prior Approval Number:    Date Approved/Denied:   PASRR Number: [id number]  Discharge Plan: SNF    Current Diagnoses: Patient Active Problem List   Diagnosis Date Noted  . Ischemic rest pain of lower extremity (HCC) 09/27/2015  . PAD (peripheral artery disease) (HCC) 07/04/2015  . Atherosclerosis of extremity with rest pain (HCC) 07/01/2015  . Atherosclerotic peripheral vascular disease of extremity (HCC) 05/30/2015  . Pre-operative cardiovascular examination 05/26/2015  . Pulmonary embolism (HCC) 09/17/2014  . NSTEMI (non-ST elevated myocardial infarction) (HCC) 05/02/2011  . Hyperlipidemia 05/02/2011  . Gout 05/02/2011  . CAD (coronary artery disease) 10/13/2010  . Smoking 10/13/2010    Orientation RESPIRATION BLADDER Height & Weight     Self, Time, Situation  Normal Continent Weight: 122 lb (55.339 kg) Height:  5\' 6"  (167.6 cm)  BEHAVIORAL SYMPTOMS/MOOD NEUROLOGICAL BOWEL NUTRITION STATUS   (none)  (none) Continent Diet  AMBULATORY STATUS COMMUNICATION OF NEEDS Skin   Extensive Assist Verbally Surgical wounds (Incision Closed: LT Leg Compression Wrap)                       Personal Care Assistance Level of Assistance  Bathing, Dressing, Feeding Bathing Assistance: Limited assistance Feeding assistance: Independent Dressing Assistance: Limited assistance     Functional Limitations Info  Sight, Hearing, Speech  Sight Info: Adequate Hearing Info: Adequate Speech Info: Adequate    SPECIAL CARE FACTORS FREQUENCY  PT (By licensed PT), OT (By licensed OT)     PT Frequency: 5/ week OT Frequency: 5/ week            Contractures Contractures Info: Not present    Additional Factors Info  Code Status, Allergies, Psychotropic Code Status Info: Full Allergies Info: Lisinopril Psychotropic Info: gabapentin (NEURONTIN) capsule 300 mg Dose: 300 mg Freq: 3 times daily Route: PO         Current Medications (09/30/2015):  This is the current hospital active medication list Current Facility-Administered Medications  Medication Dose Route Frequency Provider Last Rate Last Dose  . 0.9 %  sodium chloride infusion   Intravenous Continuous Dara LordsSamantha J Lissie Hinesley, PA-C 75 mL/hr at 09/30/15 1113    . acetaminophen (TYLENOL) tablet 325-650 mg  325-650 mg Oral Q4H PRN Ames CoupeSamantha J Hadja Harral, PA-C   650 mg at 09/30/15 0602   Or  . acetaminophen (TYLENOL) suppository 325-650 mg  325-650 mg Rectal Q4H PRN Ames CoupeSamantha J Thoren Hosang, PA-C      . alum & mag hydroxide-simeth (MAALOX/MYLANTA) 200-200-20 MG/5ML suspension 15-30 mL  15-30 mL Oral Q2H PRN Latanga Nedrow J Liem Copenhaver, PA-C      . bisacodyl (DULCOLAX) suppository 10 mg  10 mg Rectal Daily PRN Jacori Mulrooney J Alka Falwell, PA-C      . cyclobenzaprine (FLEXERIL) tablet 10 mg  10 mg Oral TID PRN Chuck Hinthristopher S Dickson, MD   10 mg at 09/29/15 1837  . diphenhydrAMINE (BENADRYL) injection 12.5 mg  12.5 mg Intravenous Q6H PRN  Charles E Fields, MD       Or  . diphenhydrAMINE (BENADRYL) 12.5 MG/5ML elixir 12.5 mg  12.5 mg Oral Q6H PRN Sherren Kerns, MD      . docusate sodium (COLACE) capsule 100 mg  100 mg Oral Daily Marrianne Sica J Tyquisha Sharps, PA-C   100 mg at 09/30/15 1103  . DULoxetine (CYMBALTA) DR capsule 30 mg  30 mg Oral Daily Soni Kegel J Tiwanda Threats, PA-C   30 mg at 09/30/15 1103  . gabapentin (NEURONTIN) capsule 300 mg  300 mg Oral TID Ames Coupe Kameisha Malicki, PA-C   300 mg at 09/30/15 1103  .  guaiFENesin-dextromethorphan (ROBITUSSIN DM) 100-10 MG/5ML syrup 15 mL  15 mL Oral Q4H PRN Jaskarn Schweer J Camber Ninh, PA-C      . hydrALAZINE (APRESOLINE) injection 5 mg  5 mg Intravenous Q20 Min PRN Faaris Arizpe J Clover Feehan, PA-C      . HYDROmorphone (DILAUDID) 1 mg/mL PCA injection   Intravenous Q4H Sherren Kerns, MD      . labetalol (NORMODYNE,TRANDATE) injection 10 mg  10 mg Intravenous Q10 min PRN Ames Coupe Hicks Feick, PA-C   10 mg at 09/28/15 0434  . metoprolol (LOPRESSOR) injection 2-5 mg  2-5 mg Intravenous Q2H PRN Ames Coupe Rosanna Bickle, PA-C   5 mg at 09/28/15 1631  . morphine 2 MG/ML injection 2-3 mg  2-3 mg Intravenous Q2H PRN Ames Coupe Evadene Wardrip, PA-C   2 mg at 09/30/15 1104  . naloxone (NARCAN) injection 0.4 mg  0.4 mg Intravenous PRN Sherren Kerns, MD       And  . sodium chloride flush (NS) 0.9 % injection 9 mL  9 mL Intravenous PRN Sherren Kerns, MD      . nitroGLYCERIN (NITROSTAT) SL tablet 0.4 mg  0.4 mg Sublingual Q5 min PRN Finnegan Gatta J Falisa Lamora, PA-C      . ondansetron (ZOFRAN) injection 4 mg  4 mg Intravenous Q6H PRN Sherren Kerns, MD      . oxyCODONE-acetaminophen (PERCOCET/ROXICET) 5-325 MG per tablet 1-2 tablet  1-2 tablet Oral Q4H PRN Dara Lords, PA-C   2 tablet at 09/30/15 1104  . pantoprazole (PROTONIX) EC tablet 40 mg  40 mg Oral Daily Paisyn Guercio J Seth Friedlander, PA-C   40 mg at 09/30/15 1103  . phenol (CHLORASEPTIC) mouth spray 1 spray  1 spray Mouth/Throat PRN Sincerity Cedar J Catrice Zuleta, PA-C      . polyethylene glycol (MIRALAX / GLYCOLAX) packet 17 g  17 g Oral Daily PRN Gene Colee J Raidyn Wassink, PA-C      . potassium chloride SA (K-DUR,KLOR-CON) CR tablet 20-40 mEq  20-40 mEq Oral Daily PRN Adriene Knipfer J Jazz Rogala, PA-C      . rivaroxaban (XARELTO) tablet 20 mg  20 mg Oral Q supper Sherren Kerns, MD   20 mg at 09/29/15 1642     Discharge Medications: Please see discharge summary for a list of discharge medications.  Relevant Imaging Results:  Relevant Lab Results:   Additional Information SSNSherren Kerns5  [facility] [employer], LCSW

## 2015-09-30 NOTE — Anesthesia Postprocedure Evaluation (Signed)
Anesthesia Post Note  Patient: Edward RedbirdKevin Pflug  Procedure(s) Performed: Procedure(s) (LRB): AMPUTATION BELOW KNEE (Right)  Patient location during evaluation: PACU Anesthesia Type: General Level of consciousness: awake Pain management: pain level controlled Respiratory status: spontaneous breathing Cardiovascular status: stable Anesthetic complications: no    Last Vitals:  Filed Vitals:   09/30/15 1120 09/30/15 1200  BP: 134/78   Pulse: 112   Temp: 37 C   Resp: 19 17    Last Pain:  Filed Vitals:   09/30/15 1308  PainSc: 6                  EDWARDS,Naomi Castrogiovanni

## 2015-09-30 NOTE — Progress Notes (Signed)
Pt refused Bath, Bathing items offered to Pt

## 2015-09-30 NOTE — Progress Notes (Signed)
Physical Therapy Treatment Patient Details Name: Edward [name] MRN: [id number] DOB: 08-14-1970 Today's Date: 09/30/2015    History of Present Illness Edward [name] is a 45 y.o. right handed male with history of CAD with stenting, DVT maintained on Xarelto, tobacco abuse, peripheral vascular disease with multiple revascularization procedures. Patient lives alone in [place]. Used a cane walker if needed. One level home 3 steps to entry. Mother can assist. Presented 09/27/2015 with resting right foot pain times several weeks with ischemic changes. Angiogram and duplex ultrasound noted to have occluded femoral popliteal bypass graft with only a small amount of tibial outflow. Limb was not felt to be salvageable. 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 Darrick Penna. Hospital course pain management.     PT Comments    Pt admitted with above diagnosis. Pt currently with functional limitations due to balance and endurance deficits as well as limited by pain.  Pt progressing slowly but is fairly safe with RW.  Hopeful for SNF soon.  Pt will benefit from skilled PT to increase their independence and safety with mobility to allow discharge to the venue listed below.    Follow Up Recommendations  SNF;Supervision/Assistance - 24 hour     Equipment Recommendations  3in1 (PT);Wheelchair (measurements PT);Wheelchair cushion (measurements PT) (16/16 lightweight amputee w/c, desk arms, anti tippers )    Recommendations for Other Services       Precautions / Restrictions Precautions Precautions: Fall Restrictions Weight Bearing Restrictions: No    Mobility  Bed Mobility Overal bed mobility: Modified Independent                Transfers Overall transfer level: Needs assistance Equipment used: Rolling walker (2 wheeled) Transfers: Sit to/from Stand Sit to Stand: Supervision         General transfer comment: Supervision for safety. VCs for safe hand placement as pt attempting  to pull up on RW.  Ambulation/Gait Ambulation/Gait assistance: Min guard;Min assist Ambulation Distance (Feet): 50 Feet Assistive device: Rolling walker (2 wheeled) Gait Pattern/deviations: Step-to pattern;Decreased stride length;Trunk flexed   Gait velocity interpretation: Below normal speed for age/gender General Gait Details: Pt able to ambulate with RW with good safety overall.  Good technique with RW.  Safe maneuvering around obstacles.   Cannot withstand challenges to balance.     Stairs            Wheelchair Mobility    Modified Rankin (Stroke Patients Only)       Balance Overall balance assessment: Needs assistance;History of Falls Sitting-balance support: No upper extremity supported;Feet supported Sitting balance-Leahy Scale: Good     Standing balance support: Bilateral upper extremity supported;During functional activity Standing balance-Leahy Scale: Poor Standing balance comment: relies on RW for balance.              High level balance activites: Direction changes;Sudden stops;Turns High Level Balance Comments: min guard assist with RW with challenges.     Cognition Arousal/Alertness: Awake/alert Behavior During Therapy: Flat affect Overall Cognitive Status: Within Functional Limits for tasks assessed                      Exercises Amputee Exercises Quad Sets: AROM;Both;10 reps;Supine Hip Flexion/Marching: AROM;Both;10 reps;Supine Knee Flexion: AROM;Both;10 reps;Supine Knee Extension: AROM;Both;10 reps;Supine Straight Leg Raises: AROM;Both;10 reps;Supine    General Comments General comments (skin integrity, edema, etc.): Pt and family still desire Rehab prior to d/c home.  Insurance denied REhab but SNF still a possibility.  Talked with pt and family  regarding need for wheelchair when pt goes home.  Pt and family understand to be cautious and limit ambulation until residual limb heals.       Pertinent Vitals/Pain Pain Assessment:  Faces Faces Pain Scale: Hurts worst Pain Location: right residual limb Pain Descriptors / Indicators: Aching;Throbbing Pain Intervention(s): Limited activity within patient's tolerance;Monitored during session;Premedicated before session;Repositioned;PCA encouraged  VSS    Home Living                      Prior Function            PT Goals (current goals can now be found in the care plan section) Progress towards PT goals: Progressing toward goals    Frequency  Min 5X/week    PT Plan Discharge plan needs to be updated    Co-evaluation             End of Session Equipment Utilized During Treatment: Gait belt Activity Tolerance: Patient limited by fatigue Patient left: in bed;with call bell/phone within reach;with family/visitor present     Time: [id number] PT Time Calculation (min) (ACUTE ONLY): 24 min  Charges:  $Gait Training: 8-22 mins $Therapeutic Exercise: 8-22 mins                    G CodesTawni Millers:      Wood Novacek F 09/30/2015, 4:02 PM  Florencia Zaccaro,PT Acute Rehabilitation [phone] (pager)

## 2015-09-30 NOTE — Progress Notes (Signed)
CH paid a follow up visit with the patient and his mother. The patient was in better spirits but was very tired. Our conversation was light, but felt the visit was appreciated. I offered prayer and will follow up as needed.  Edward SkeensBenjamin T [name] 11:19 AM    09/30/15 1100  Clinical Encounter Type  Visited With Patient and family together  Visit Type Follow-up;Spiritual support  Spiritual Encounters  Spiritual Needs Prayer;Emotional;Grief support

## 2015-09-30 NOTE — Clinical Social Work Note (Signed)
CSW met with patient. CSW explained SNF search and placement process to the patient and answered his questions.  He reported his agreeable to SNF depending on his bed offers. CSW started SNF search and placement process. CSW to follow up with bed offers.   [employer], LCSW [phone]

## 2015-10-01 ENCOUNTER — Encounter (HOSPITAL_COMMUNITY): Payer: Self-pay | Admitting: Student

## 2015-10-01 NOTE — Progress Notes (Signed)
Notified by family that patient was disoriented and being impulsive about his need to smoke. Charge RN assessed pt and did notice any impulsive behavior. Pt stated that he wanted to smoke really bad and wanted to know if he could go downstairs to smoke.Assessed patient and did not notice impulsiveness nor confusion.Pt expressed to me as well that he wanted to smoke. Notified MD on call and asked for Nicotine patch. Dr. Arbie CookeyEarly ordered a Nicotine patch and PRN ativan for patient. I explained [place] policy for smoking and educated on smoking cessation d/t vascular issues and its effects on healing. Pt verbalized understanding. Will continue top monitor.

## 2015-10-01 NOTE — Progress Notes (Addendum)
  Progress Note    10/01/2015 8:41 AM 4 Days Post-Op  Subjective:  Pain is better (off PCA); nicotine patch is helping  Tm 100.5 HR  90's-130's ST 140's-150's systolic 93% RA  Filed Vitals:   10/01/15 0515 10/01/15 0745  BP: 150/88   Pulse: 130   Temp: 100.5 F (38.1 C)   Resp: 20 18    Physical Exam: Incisions:  Clean and dry with some serous drainage on the bandage Extremities:  Lifting right leg  CBC    Component Value Date/Time   WBC 9.8 09/29/2015 0351   WBC 6.7 06/22/2011 1506   RBC 4.43 09/29/2015 0351   RBC 4.77 06/22/2011 1506   HGB 13.7 09/29/2015 0351   HGB 16.2 06/22/2011 1506   HCT 43.5 09/29/2015 0351   HCT 46.3 06/22/2011 1506   PLT 191 09/29/2015 0351   PLT 192 06/22/2011 1506   MCV 98.2 09/29/2015 0351   MCV 97 06/22/2011 1506   MCH 30.9 09/29/2015 0351   MCH 34.0 06/22/2011 1506   MCHC 31.5 09/29/2015 0351   MCHC 34.9 06/22/2011 1506   RDW 13.1 09/29/2015 0351   RDW 12.4 06/22/2011 1506   LYMPHSABS 3.0 06/30/2015 2115   MONOABS 0.4 06/30/2015 2115   EOSABS 0.3 06/30/2015 2115   BASOSABS 0.0 06/30/2015 2115    BMET    Component Value Date/Time   NA 135 09/29/2015 0351   NA 143 06/22/2011 1506   K 4.8 09/29/2015 0351   K 3.9 06/22/2011 1506   CL 98* 09/29/2015 0351   CL 106 06/22/2011 1506   CO2 30 09/29/2015 0351   CO2 27 06/22/2011 1506   GLUCOSE 128* 09/29/2015 0351   GLUCOSE 84 06/22/2011 1506   BUN 6 09/29/2015 0351   BUN 16 06/22/2011 1506   CREATININE 1.06 09/29/2015 0351   CREATININE 1.11 06/22/2011 1506   CALCIUM 8.5* 09/29/2015 0351   CALCIUM 9.1 06/22/2011 1506   GFRNONAA >60 09/29/2015 0351   GFRNONAA >60 06/22/2011 1506   GFRAA >60 09/29/2015 0351   GFRAA >60 06/22/2011 1506    INR    Component Value Date/Time   INR 1.04 09/27/2015 0941     Intake/Output Summary (Last 24 hours) at 10/01/15 0841 Last data filed at 09/30/15 1800  Gross per 24 hour  Intake    600 ml  Output      0 ml  Net    600 ml      Assessment/Plan:  45 y.o. male is s/p right below knee amputation  4 Days Post-Op  -stump is viable -pt is off PCA-continue pain control -pt wanting to smoke last night-nicotine patch ordered and pt says it's helping -d/c IVF -continue mobilization -for SNF next week for short term rehab.  Social work will f/u with bed offers.   Doreatha MassedSamantha Rhyne, PA-C [facility] [phone] 10/01/2015 8:41 AM           I have examined the patient, reviewed and agree with above.  Gretta BeganEarly, Jaila Schellhorn, MD 10/01/2015 2:35 PM

## 2015-10-02 NOTE — Progress Notes (Addendum)
  Progress Note    10/02/2015 7:58 AM 5 Days Post-Op  Subjective:  Still has pain but is better than before.  "walked around the block last night"  C/o arms getting tired.   Tm 98.9 HR 120's ST 120's systolic 96% RA   Filed Vitals:   10/01/15 1417 10/01/15 2104  BP: 144/90 125/73  Pulse: 127 128  Temp: 98.4 F (36.9 C) 98.9 F (37.2 C)  Resp: 22 20    Physical Exam: Incisions:  Less serous drainage on bandage today than yesterday; otherwise, incision is clean and dry with staples in tact.  Some bruising anterior/proximal to incision. Extremities:  Able to lift right leg well for dressing change.  CBC    Component Value Date/Time   WBC 9.8 09/29/2015 0351   WBC 6.7 06/22/2011 1506   RBC 4.43 09/29/2015 0351   RBC 4.77 06/22/2011 1506   HGB 13.7 09/29/2015 0351   HGB 16.2 06/22/2011 1506   HCT 43.5 09/29/2015 0351   HCT 46.3 06/22/2011 1506   PLT 191 09/29/2015 0351   PLT 192 06/22/2011 1506   MCV 98.2 09/29/2015 0351   MCV 97 06/22/2011 1506   MCH 30.9 09/29/2015 0351   MCH 34.0 06/22/2011 1506   MCHC 31.5 09/29/2015 0351   MCHC 34.9 06/22/2011 1506   RDW 13.1 09/29/2015 0351   RDW 12.4 06/22/2011 1506   LYMPHSABS 3.0 06/30/2015 2115   MONOABS 0.4 06/30/2015 2115   EOSABS 0.3 06/30/2015 2115   BASOSABS 0.0 06/30/2015 2115    BMET    Component Value Date/Time   NA 135 09/29/2015 0351   NA 143 06/22/2011 1506   K 4.8 09/29/2015 0351   K 3.9 06/22/2011 1506   CL 98* 09/29/2015 0351   CL 106 06/22/2011 1506   CO2 30 09/29/2015 0351   CO2 27 06/22/2011 1506   GLUCOSE 128* 09/29/2015 0351   GLUCOSE 84 06/22/2011 1506   BUN 6 09/29/2015 0351   BUN 16 06/22/2011 1506   CREATININE 1.06 09/29/2015 0351   CREATININE 1.11 06/22/2011 1506   CALCIUM 8.5* 09/29/2015 0351   CALCIUM 9.1 06/22/2011 1506   GFRNONAA >60 09/29/2015 0351   GFRNONAA >60 06/22/2011 1506   GFRAA >60 09/29/2015 0351   GFRAA >60 06/22/2011 1506    INR    Component Value Date/Time    INR 1.04 09/27/2015 0941    No intake or output data in the 24 hours ending 10/02/15 0758    Assessment/Plan:  45 y.o. male is s/p right below knee amputation  5 Days Post-Op  -stump is viable -pt is ambulating with walker -sinus tach- HR in 120's--most likely related to pain   Doreatha MassedSamantha Rhyne, PA-C [facility] [phone] 10/02/2015 7:58 AM    I have examined the patient, reviewed and agree with above. Await arrangements for outpatient PT and OT  Gretta BeganEarly, Ikey Omary, MD 10/02/2015 9:51 AM

## 2015-10-03 MED ORDER — OXYCODONE-ACETAMINOPHEN 5-325 MG PO TABS: 1.0000 | ORAL_TABLET | ORAL | Status: DC | PRN

## 2015-10-03 MED ORDER — GUAIFENESIN-DM 100-10 MG/5ML PO SYRP: 15.0000 mL | ORAL_SOLUTION | ORAL | Status: DC | PRN

## 2015-10-03 MED ORDER — NICOTINE 21 MG/24HR TD PT24: 21.0000 mg | MEDICATED_PATCH | Freq: Every day | TRANSDERMAL | Status: DC

## 2015-10-03 NOTE — Progress Notes (Signed)
Patient is s/p below knee amputation which impairs their ability to perform daily activities like toileting in the home. A walker will not resolve  issue with performing activities of daily living. A wheelchair will allow patient to safely perform daily activities. Patient can safely propel the wheelchair in the home or has a caregiver who can provide assistance.  Accessories: elevating leg rests (ELRs), wheel locks, extensions and anti-tippers.     Copied note from wheelchair order.

## 2015-10-03 NOTE — Care Management Note (Addendum)
Case Management Note Donn PieriniKristi Kaylia Winborne RN, BSN Unit 2W-Case Manager [phone]  Patient Details  Name: Edward RedbirdKevin [name] MRN: [id number] Date of Birth: November 14, 1970  Subjective/Objective:   Pt s/p right BKA                 Action/Plan: PTA pt lived at home- insurance has denied CIR, CSW following for possible STSNF- however bed offers are limited- [facility] -   Expected Discharge Date:    10/03/15              Expected Discharge Plan:  Home w Home Health Services  In-House Referral:  Clinical Social Work  Discharge planning Services  CM Consult  Post Acute Care Choice:  Home Health, Durable Medical Equipment Choice offered to:  Patient  DME Arranged:  Government social research officerWheelchair manual DME Agency:  [facility]  HH Arranged:  PT, OT HH Agency:  [facility]  Status of Service:  Completed, signed off  Medicare Important Message Given:    Date Medicare IM Given:    Medicare IM give by:    Date Additional Medicare IM Given:    Additional Medicare Important Message give by:     If discussed at Long Length of Stay Meetings, dates discussed:    Additional Comments:  10/03/15- 1230- Donn PieriniKristi Ardene Remley RN, BSN- per PT eval today- [facility]- will need W/C amputee type for home- spoke with pt at bedside- [facility] [place]- [facility] the [facility] and would like [facility]- [facility]- referral for HHPT/OT called to Tiffany with [facility] ([facility] a [facility]- DME to be delivered to room prior to discharge.   Darrold SpanWebster, Ibeth Fahmy Hall, RN 10/03/2015, 4:00 PM

## 2015-10-03 NOTE — Progress Notes (Signed)
Per PT note pt is now being recommended for home health services- [facility] pt is agreeable to this plan  CSW signing off  Merlyn LotJenna Holoman, [facility] Clinical Social Worker [phone]

## 2015-10-03 NOTE — Progress Notes (Addendum)
  [facility] Progress Note  Subjective  - POD #6  Does not feel that his pain is much better.   Objective Filed Vitals:   10/02/15 2029 10/03/15 0516  BP: 128/89 128/86  Pulse: 124 108  Temp: 97.8 F (36.6 C) 97.9 F (36.6 C)  Resp: 20     Intake/Output Summary (Last 24 hours) at 10/03/15 0733 Last data filed at 10/02/15 2312  Gross per 24 hour  Intake      0 ml  Output    700 ml  Net   -700 ml    Right BKA with red ecchymosis anterior and edema. No drainage seen.   Assessment/Planning: 45 y.o. male is s/p: right below knee amputation 6 Days Post-Op   Pain minimally better. Would prefer oxycodone over Percocet and asking about dose higher than 7.5 mg.  BKA with edema and ecchymosis anterior.  Insurance denied CIR. Patient would prefer to go home. Will have PT eval again today. Dispo: SNF vs home with HH.   Raymond GurneyKimberly A Trinh 10/03/2015 7:33 AM --  Lengthy discussion with patient regarding his pain medication.  Discussed that I would not send him home on anything stronger than Percocet 5/325 and if he needed more than this he should stay in the hospital.  He states his pain is currently 7/10 but wishes to go home.  Fabienne Brunsharles Vallen Calabrese, MD [facility] [place] Office: [phone] Pager: [phone]   Laboratory CBC    Component Value Date/Time   WBC 9.8 09/29/2015 0351   WBC 6.7 06/22/2011 1506   HGB 13.7 09/29/2015 0351   HGB 16.2 06/22/2011 1506   HCT 43.5 09/29/2015 0351   HCT 46.3 06/22/2011 1506   PLT 191 09/29/2015 0351   PLT 192 06/22/2011 1506    BMET    Component Value Date/Time   NA 135 09/29/2015 0351   NA 143 06/22/2011 1506   K 4.8 09/29/2015 0351   K 3.9 06/22/2011 1506   CL 98* 09/29/2015 0351   CL 106 06/22/2011 1506   CO2 30 09/29/2015 0351   CO2 27 06/22/2011 1506   GLUCOSE 128* 09/29/2015 0351   GLUCOSE 84 06/22/2011 1506   BUN 6 09/29/2015 0351   BUN 16 06/22/2011 1506   CREATININE  1.06 09/29/2015 0351   CREATININE 1.11 06/22/2011 1506   CALCIUM 8.5* 09/29/2015 0351   CALCIUM 9.1 06/22/2011 1506   GFRNONAA >60 09/29/2015 0351   GFRNONAA >60 06/22/2011 1506   GFRAA >60 09/29/2015 0351   GFRAA >60 06/22/2011 1506    COAG Lab Results  Component Value Date   INR 1.04 09/27/2015   INR 1.42 06/30/2015   INR 1.02 05/26/2015   No results found for: PTT  Antibiotics Anti-infectives    Start     Dose/Rate Route Frequency Ordered Stop   09/27/15 2200  cefUROXime (ZINACEF) 1.5 g in dextrose 5 % 50 mL IVPB     1.5 g 100 mL/hr over 30 Minutes Intravenous Every 12 hours 09/27/15 1636 09/28/15 1133   09/27/15 1030  cefUROXime (ZINACEF) 1.5 g in dextrose 5 % 50 mL IVPB     1.5 g 100 mL/hr over 30 Minutes Intravenous To ShortStay Surgical 09/26/15 0932 09/27/15 1358       Maris BergerKimberly Trinh, PA-C [facility] Office: [phone] Pager: [phone] 10/03/2015 7:33 AM

## 2015-10-03 NOTE — Progress Notes (Signed)
Per PT note patient will need HHRN also. Thomas HoffBurton, Jay Haskew McClintock, RN

## 2015-10-03 NOTE — Discharge Instructions (Signed)
Information on my medicine - XARELTO® (rivaroxaban) ° °WHY WAS XARELTO® PRESCRIBED FOR YOU? °Xarelto® was prescribed to treat blood clots that may have been found in the veins of your legs (deep vein thrombosis) or in your lungs (pulmonary embolism) and to reduce the risk of them occurring again. ° °What do you need to know about Xarelto®? °Take one 20 mg tablet taken ONCE A DAY with your evening meal. ° °DO NOT stop taking Xarelto® without talking to the health care provider who prescribed the medication.  Refill your prescription for 20 mg tablets before you run out. ° °After discharge, you should have regular check-up appointments with your healthcare provider that is prescribing your Xarelto®.  In the future your dose may need to be changed if your kidney function changes by a significant amount. ° °What do you do if you miss a dose? °If you are taking Xarelto® TWICE DAILY and you miss a dose, take it as soon as you remember. You may take two 15 mg tablets (total 30 mg) at the same time then resume your regularly scheduled 15 mg twice daily the next day. ° °If you are taking Xarelto® ONCE DAILY and you miss a dose, take it as soon as you remember on the same day then continue your regularly scheduled once daily regimen the next day. Do not take two doses of Xarelto® at the same time.  ° °Important Safety Information °Xarelto® is a blood thinner medicine that can cause bleeding. You should call your healthcare provider right away if you experience any of the following: °? Bleeding from an injury or your nose that does not stop. °? Unusual colored urine (red or dark brown) or unusual colored stools (red or black). °? Unusual bruising for unknown reasons. °? A serious fall or if you hit your head (even if there is no bleeding). ° °Some medicines may interact with Xarelto® and might increase your risk of bleeding while on Xarelto®. To help avoid this, consult your healthcare provider or pharmacist prior to using any  new prescription or non-prescription medications, including herbals, vitamins, non-steroidal anti-inflammatory drugs (NSAIDs) and supplements. ° °This website has more information on Xarelto®: www.xarelto.com. ° ° °

## 2015-10-03 NOTE — Progress Notes (Signed)
Physical Therapy Treatment Patient Details Name: Edward [name] MRN: [id number] DOB: 11/27/1970 Today's Date: 10/03/2015    History of Present Illness Edward [name] is a 45 y.o. right handed male with history of CAD with stenting, DVT maintained on Xarelto, tobacco abuse, peripheral vascular disease with multiple revascularization procedures. Patient lives alone in [place]. Used a cane walker if needed. One level home 3 steps to entry. Mother can assist. Presented 09/27/2015 with resting right foot pain times several weeks with ischemic changes. Angiogram and duplex ultrasound noted to have occluded femoral popliteal bypass graft with only a small amount of tibial outflow. Limb was not felt to be salvageable. 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 Darrick Penna. Hospital course pain management.     PT Comments    Pt admitted with above diagnosis. Pt currently with functional limitations due to balance and endurance deficits. Pt was able to ambulate on unit.  Pt wants to go home.  Educated friend Edward Batten regarding up and down stairs, car transfer, and to purchase gait belt from gift shop.  Pt and family feel comfortable taking pt home.  HHPT to continue education prn.  Pt will benefit from skilled PT to increase their independence and safety with mobility to allow discharge to the venue listed below.    Follow Up Recommendations  Home health PT;Supervision/Assistance - 24 hour (HHOT, HHRN)     Equipment Recommendations  Wheelchair (measurements PT);Wheelchair cushion (measurements PT) (16/16 lightweight amputee w/c, desk arms, anti tippers )  Gait belt  Recommendations for Other Services       Precautions / Restrictions Precautions Precautions: Fall Restrictions Weight Bearing Restrictions: No    Mobility  Bed Mobility Overal bed mobility: Modified Independent             General bed mobility comments: VCs for hand placement and to avoid pulling on RW to move to  EOB  Transfers Overall transfer level: Needs assistance Equipment used: Rolling walker (2 wheeled) Transfers: Sit to/from Stand Sit to Stand: Supervision         General transfer comment: Supervision for safety. VCs for safe hand placement as pt attempting to pull up on RW.Edward [name] present and educated quite a bit.   Ambulation/Gait Ambulation/Gait assistance: Supervision;Min guard Ambulation Distance (Feet): 150 Feet Assistive device: Rolling walker (2 wheeled) Gait Pattern/deviations: Step-to pattern;Decreased stride length   Gait velocity interpretation: Below normal speed for age/gender General Gait Details: Pt able to ambulate with RW with good safety overall.  Good technique with RW.  Safe maneuvering around obstacles.   Cannot withstand challenges to balance.     Stairs            Wheelchair Mobility    Modified Rankin (Stroke Patients Only)       Balance Overall balance assessment: Needs assistance;History of Falls Sitting-balance support: No upper extremity supported;Feet supported Sitting balance-Leahy Scale: Good     Standing balance support: Bilateral upper extremity supported;During functional activity Standing balance-Leahy Scale: Poor Standing balance comment: relies on RW for balance.              High level balance activites: Direction changes;Turns;Sudden stops High Level Balance Comments: min guard assist with RW with challenges.     Cognition Arousal/Alertness: Awake/alert Behavior During Therapy: Flat affect Overall Cognitive Status: Within Functional Limits for tasks assessed                      Exercises Amputee Exercises Quad Sets: AROM;Both;10  reps;Supine Knee Extension: AROM;Both;10 reps;Supine Straight Leg Raises: AROM;Both;10 reps;Supine  Pt and family know that pt needs to stretch residual limb hamstring and place no pillows under the knee.  Encourage knee extension as well as hip extension.      General  Comments General comments (skin integrity, edema, etc.): Pt and family discussed that they want to pt to go home.  Pt refused to practice up and down steps therefore took Edward [name] and educated her in going up and down stairs with RW.  Jovita GammaGave handout as well. Edward [name] to go purchase gait belt for pt from gift shop to use with abulation and up and down steps.  Also verbal instruction of how to perform car transfer.        Pertinent Vitals/Pain Pain Assessment: Faces Faces Pain Scale: Hurts a little bit Pain Location: right residual limb Pain Descriptors / Indicators: Aching Pain Intervention(s): Limited activity within patient's tolerance;Monitored during session;Repositioned  VSS    Home Living                      Prior Function            PT Goals (current goals can now be found in the care plan section) Progress towards PT goals: Progressing toward goals    Frequency  Min 5X/week    PT Plan Discharge plan needs to be updated    Co-evaluation             End of Session Equipment Utilized During Treatment: Gait belt Activity Tolerance: Patient limited by fatigue Patient left: in bed;with call bell/phone within reach;with family/visitor present     Time: [id number] PT Time Calculation (min) (ACUTE ONLY): 33 min  Charges:  $Gait Training: 8-22 mins $Self Care/Home Management: 8-22                    G Codes:      Ranell Skibinski F 10/03/2015, 12:02 PM Alexandrea Westergard,PT Acute Rehabilitation [phone] (pager)

## 2015-10-03 NOTE — Progress Notes (Signed)
Pt/family given discharge instructions, medication lists, follow up appointments, and when to call the doctor.  Pt/family verbalizes understanding. Pt given signs and symptoms of infection. Gave patient additional information on transfers from bed to chair to vehicle.  Patient states he has walker at home. Continued to try to keep patient focused on discharge instructions.  Mother and wife receptive and eager to get care education.  Thomas HoffBurton, Sarabella Caprio McClintock, RN

## 2015-10-05 ENCOUNTER — Encounter (HOSPITAL_COMMUNITY): Payer: Self-pay | Admitting: Vascular Surgery

## 2015-10-05 NOTE — Anesthesia Preprocedure Evaluation (Addendum)
Anesthesia Evaluation   Patient awake    Reviewed: Allergy & Precautions, NPO status , Patient's Chart, lab work & pertinent test results  Airway Mallampati: II  TM Distance: >3 FB Neck ROM: Full    Dental   Pulmonary shortness of breath, COPD, Current Smoker,    breath sounds clear to auscultation       Cardiovascular + CAD, + Past MI and + Peripheral Vascular Disease   Rhythm:Regular Rate:Normal     Neuro/Psych    GI/Hepatic negative GI ROS, Neg liver ROS,   Endo/Other  negative endocrine ROS  Renal/GU negative Renal ROS     Musculoskeletal   Abdominal   Peds  Hematology   Anesthesia Other Findings   Reproductive/Obstetrics                            Anesthesia Physical Anesthesia Plan  ASA: IV  Anesthesia Plan: General   Post-op Pain Management:    Induction: Intravenous  Airway Management Planned: Oral ETT  Additional Equipment:   Intra-op Plan:   Post-operative Plan: Extubation in OR  Informed Consent: I have reviewed the patients History and Physical, chart, labs and discussed the procedure including the risks, benefits and alternatives for the proposed anesthesia with the patient or authorized representative who has indicated his/her understanding and acceptance.   Dental advisory given  Plan Discussed with: CRNA and Anesthesiologist  Anesthesia Plan Comments:         Anesthesia Quick Evaluation

## 2015-10-06 NOTE — Discharge Summary (Signed)
[facility] Discharge Summary  Bernette RedbirdKevin Falwell 1970/12/08 45 y.o. 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 male who underwent right femoral to below-knee popliteal bypass in February 2017. He then underwent a redo bypass in March 2017. He now complains of pain in the right foot that has been going on for several weeks. On duplex ultrasound today he was noted to have an occluded bypass. At this point he is requesting amputation of his right leg. He has rest pain in the right foot. He does not have any open wounds. Unfortunately he continues to smoke. He is on Xarelto for prior pulmonary embolus.   Hospital Course:  The patient was admitted to the hospital and taken to the operating room on 09/27/2015 and underwent: Right below-knee amputation for rest pain.    The patient tolerated the procedure well and was transported to the PACU in stable condition.   Postoperatively, the patient had issues with pain control. He required a Dilaudid PCA. His right BKA was viable. There was some ecchymosis on the anterior aspect of his stump. His incision was clean and intact. The majority of his postoperative course consisted of pain control. His insurance denied inpatient rehabilitation. The patient was doing well with physical therapy and recommended home health. The patient still had 7 out of 10 pain on postop day 6. However, the patient refused to stay in the hospital longer and wanted to go home. He was discharged home on postop day 6 in good condition.    CBC    Component Value Date/Time   WBC 9.8 09/29/2015 0351   WBC 6.7 06/22/2011 1506   RBC 4.43 09/29/2015 0351   RBC 4.77 06/22/2011 1506   HGB 13.7 09/29/2015 0351   HGB 16.2 06/22/2011 1506   HCT 43.5 09/29/2015 0351    HCT 46.3 06/22/2011 1506   PLT 191 09/29/2015 0351   PLT 192 06/22/2011 1506   MCV 98.2 09/29/2015 0351   MCV 97 06/22/2011 1506   MCH 30.9 09/29/2015 0351   MCH 34.0 06/22/2011 1506   MCHC 31.5 09/29/2015 0351   MCHC 34.9 06/22/2011 1506   RDW 13.1 09/29/2015 0351   RDW 12.4 06/22/2011 1506   LYMPHSABS 3.0 06/30/2015 2115   MONOABS 0.4 06/30/2015 2115   EOSABS 0.3 06/30/2015 2115   BASOSABS 0.0 06/30/2015 2115    BMET    Component Value Date/Time   NA 135 09/29/2015 0351   NA 143 06/22/2011 1506   K 4.8 09/29/2015 0351   K 3.9 06/22/2011 1506   CL 98* 09/29/2015 0351   CL 106 06/22/2011 1506   CO2 30 09/29/2015 0351   CO2 27 06/22/2011 1506   GLUCOSE 128* 09/29/2015 0351   GLUCOSE 84 06/22/2011 1506   BUN 6 09/29/2015 0351   BUN 16 06/22/2011 1506   CREATININE 1.06 09/29/2015 0351   CREATININE 1.11 06/22/2011 1506   CALCIUM 8.5* 09/29/2015 0351   CALCIUM 9.1 06/22/2011 1506   GFRNONAA >60 09/29/2015 0351   GFRNONAA >60 06/22/2011 1506   GFRAA >60 09/29/2015 0351   GFRAA >60 06/22/2011 1506     Discharge Instructions:   The patient is discharged to home with extensive instructions on wound care and progressive ambulation.  They are instructed not to drive or perform any heavy lifting until  returning to see the physician in his office.  Discharge Instructions    Call MD for:  redness, tenderness, or signs of infection (pain, swelling, bleeding, redness, odor or green/yellow discharge around incision site)    Complete by:  As directed      Call MD for:  severe or increased pain, loss or decreased feeling  in affected limb(s)    Complete by:  As directed      Call MD for:  temperature >100.5    Complete by:  As directed      Discharge wound care:    Complete by:  As directed   Wash right leg daily with soap and water. Apply gauze to incision line. Wrap with gauze wrap (kerlix) and ACE wrap daily.     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 Ischemic rest pain of lower extremity (HCC) 09/27/2015  . PAD (peripheral artery disease) (HCC) 07/04/2015  . Atherosclerosis of extremity with rest pain (HCC) 07/01/2015  . Atherosclerotic peripheral vascular disease of extremity (HCC) 05/30/2015  . Pre-operative cardiovascular examination 05/26/2015  . Pulmonary embolism (HCC) 09/17/2014  . NSTEMI (non-ST elevated myocardial infarction) (HCC) 05/02/2011  . Hyperlipidemia 05/02/2011  . Gout 05/02/2011  . CAD (coronary artery disease) 10/13/2010  . Smoking 10/13/2010   Past Medical History  Diagnosis Date  . Pneumothorax 2006    s/p chest tube placement x 2   . Emphysema   . DVT (deep venous thrombosis) (HCC)     right leg-takes Xarelto daily bridging with Lovenox for surgery  . Coronary artery disease   . Peripheral vascular disease (HCC)   . Myocardial infarction (HCC) 2012  . Pulmonary embolism (HCC)   . Emphysema lung (HCC)   . Shortness of breath dyspnea     with exertion  . History of bronchitis   . Vertigo     rarely but doesn't take any meds  . Peripheral neuropathy (HCC)     takes Gabapentin daily  . History of gout     not on any meds  . Depression     not on any meds  . Collagen vascular disease (HCC)   . Dizziness 07-16-2015       Medication List    STOP taking these medications        methylPREDNISolone 4 MG Tbpk tablet  Commonly known as:  MEDROL DOSEPAK      TAKE these medications        DULoxetine 30 MG capsule  Commonly known as:  CYMBALTA  Take 30 mg by mouth daily.     gabapentin 300 MG capsule  Commonly known as:  NEURONTIN  Take 300 mg by mouth 3 (three) times daily.      guaiFENesin-dextromethorphan 100-10 MG/5ML syrup  Commonly known as:  ROBITUSSIN DM  Take 15 mLs by mouth every 4 (four) hours as needed for cough.     nicotine 21 mg/24hr patch  Commonly known as:  NICODERM CQ - dosed in mg/24 hours  Place 1 patch (21 mg total) onto the skin daily.  nitroGLYCERIN 0.4 MG SL tablet  Commonly known as:  NITROSTAT  Place 0.4 mg under the tongue every 5 (five) minutes as needed for chest pain.     oxyCODONE-acetaminophen 5-325 MG tablet  Commonly known as:  PERCOCET/ROXICET  Take 1-2 tablets by mouth every 4 (four) hours as needed for moderate pain.     rivaroxaban 20 MG Tabs tablet  Commonly known as:  XARELTO  Take 20 mg by mouth daily with lunch.        Percocet #30 No Refill  Disposition: Home  Patient's condition: is Good  Follow up: 1. Dr. Darrick Penna in 4 weeks   Maris Berger, PA-C [facility] [phone] 10/06/2015  1:41 PM

## 2015-10-14 ENCOUNTER — Telehealth: Payer: Self-pay | Admitting: Vascular Surgery

## 2015-10-14 NOTE — Telephone Encounter (Signed)
-----   Message from Phillips Odorarol S Pullins, RN sent at 10/14/2015  3:16 PM EDT ----- Regarding: move appt. to 7/13 Please move his post -op f/u to 7/13 with Dr. Darrick PennaFields at 12:30 PM; spoke with pt's caregiver today, and he would like to get in earlier than 7/27.  (initally had appt. On 7/13, and had conflict with another appt., but can make the 12:30 appt. work.)  Engineer, structuralCaregiver, [name], agreed.

## 2015-10-14 NOTE — Telephone Encounter (Signed)
Resched appt to 7/13 at 12:30.

## 2015-10-17 ENCOUNTER — Encounter (HOSPITAL_COMMUNITY): Payer: Self-pay | Admitting: Emergency Medicine

## 2015-10-17 ENCOUNTER — Observation Stay (HOSPITAL_COMMUNITY)
Admission: EM | Admit: 2015-10-17 | Discharge: 2015-10-19 | DRG: 566 | Disposition: A | Discharge status: Home / Self Care | Payer: No Typology Code available for payment source | Attending: Vascular Surgery | Admitting: Vascular Surgery

## 2015-10-17 ENCOUNTER — Telehealth: Payer: Self-pay

## 2015-10-17 DIAGNOSIS — L03115 Cellulitis of right lower limb: Secondary | ICD-10-CM | POA: Diagnosis present

## 2015-10-17 DIAGNOSIS — I252 Old myocardial infarction: Secondary | ICD-10-CM | POA: Diagnosis not present

## 2015-10-17 DIAGNOSIS — T8743 Infection of amputation stump, right lower extremity: Secondary | ICD-10-CM | POA: Diagnosis present

## 2015-10-17 DIAGNOSIS — I2510 Atherosclerotic heart disease of native coronary artery without angina pectoris: Secondary | ICD-10-CM | POA: Diagnosis present

## 2015-10-17 DIAGNOSIS — I739 Peripheral vascular disease, unspecified: Secondary | ICD-10-CM | POA: Diagnosis not present

## 2015-10-17 DIAGNOSIS — F17210 Nicotine dependence, cigarettes, uncomplicated: Secondary | ICD-10-CM | POA: Diagnosis not present

## 2015-10-17 HISTORY — DX: Unspecified osteoarthritis, unspecified site: M19.90

## 2015-10-17 LAB — COMPREHENSIVE METABOLIC PANEL
ALT: 15 U/L — ABNORMAL LOW (ref 17–63)
AST: 21 U/L (ref 15–41)
Albumin: 3.5 g/dL (ref 3.5–5.0)
Alkaline Phosphatase: 135 U/L — ABNORMAL HIGH (ref 38–126)
Anion gap: 10 (ref 5–15)
BUN: 11 mg/dL (ref 6–20)
CO2: 25 mmol/L (ref 22–32)
Calcium: 9.4 mg/dL (ref 8.9–10.3)
Chloride: 100 mmol/L — ABNORMAL LOW (ref 101–111)
Creatinine, Ser: 0.89 mg/dL (ref 0.61–1.24)
GFR calc Af Amer: >60 mL/min (ref >60)
GFR calc non Af Amer: >60 mL/min (ref >60)
Glucose, Bld: 108 mg/dL — ABNORMAL HIGH (ref 65–99)
Potassium: 4.4 mmol/L (ref 3.5–5.1)
Sodium: 135 mmol/L (ref 135–145)
Total Bilirubin: 0.6 mg/dL (ref 0.3–1.2)
Total Protein: 7.5 g/dL (ref 6.5–8.1)

## 2015-10-17 LAB — CBC
HCT: 45 % (ref 39.0–52.0)
Hemoglobin: 15 g/dL (ref 13.0–17.0)
MCH: 31.8 pg (ref 26.0–34.0)
MCHC: 33.3 g/dL (ref 30.0–36.0)
MCV: 95.3 fL (ref 78.0–100.0)
Platelets: 531 10*3/uL — ABNORMAL HIGH (ref 150–400)
RBC: 4.72 MIL/uL (ref 4.22–5.81)
RDW: 12.8 % (ref 11.5–15.5)
WBC: 7.8 10*3/uL (ref 4.0–10.5)

## 2015-10-17 MED ORDER — HEPARIN SODIUM (PORCINE) 5000 UNIT/ML IJ SOLN: 5000.0000 [IU] | Freq: Three times a day (TID) | INTRAMUSCULAR | Status: DC

## 2015-10-17 MED ORDER — MORPHINE SULFATE (PF) 2 MG/ML IV SOLN
2.0000 mg | INTRAVENOUS | Status: DC | PRN
  Administered 2015-10-17: 4 mg via INTRAVENOUS
  Administered 2015-10-17: 2 mg via INTRAVENOUS
  Administered 2015-10-17 – 2015-10-18 (×2): 4 mg via INTRAVENOUS
  Filled 2015-10-17: qty 2
  Filled 2015-10-17: qty 1
  Filled 2015-10-17 (×2): qty 2

## 2015-10-17 MED ORDER — GABAPENTIN 300 MG PO CAPS
300.0000 mg | ORAL_CAPSULE | Freq: Three times a day (TID) | ORAL | Status: DC
  Administered 2015-10-17 – 2015-10-19 (×6): 300 mg via ORAL
  Filled 2015-10-17 (×6): qty 1

## 2015-10-17 MED ORDER — VANCOMYCIN HCL IN DEXTROSE 750-5 MG/150ML-% IV SOLN
750.0000 mg | Freq: Two times a day (BID) | INTRAVENOUS | Status: DC
Start: 2015-10-18 — End: 2015-10-17
  Filled 2015-10-17 (×2): qty 150

## 2015-10-17 MED ORDER — DULOXETINE HCL 60 MG PO CPEP
60.0000 mg | ORAL_CAPSULE | Freq: Every day | ORAL | Status: DC
  Administered 2015-10-18 – 2015-10-19 (×2): 60 mg via ORAL
  Filled 2015-10-17 (×2): qty 1

## 2015-10-17 MED ORDER — PANTOPRAZOLE SODIUM 40 MG PO TBEC
40.0000 mg | DELAYED_RELEASE_TABLET | Freq: Every day | ORAL | Status: DC
  Administered 2015-10-17 – 2015-10-19 (×3): 40 mg via ORAL
  Filled 2015-10-17 (×2): qty 1

## 2015-10-17 MED ORDER — SODIUM CHLORIDE 0.9 % IV SOLN
250.0000 mL | INTRAVENOUS | Status: DC | PRN
  Administered 2015-10-17: 250 mL via INTRAVENOUS

## 2015-10-17 MED ORDER — VANCOMYCIN HCL IN DEXTROSE 750-5 MG/150ML-% IV SOLN
750.0000 mg | Freq: Two times a day (BID) | INTRAVENOUS | Status: DC
  Administered 2015-10-18 – 2015-10-19 (×3): 750 mg via INTRAVENOUS
  Filled 2015-10-17 (×4): qty 150

## 2015-10-17 MED ORDER — DOCUSATE SODIUM 100 MG PO CAPS
100.0000 mg | ORAL_CAPSULE | Freq: Two times a day (BID) | ORAL | Status: DC
  Administered 2015-10-18 – 2015-10-19 (×2): 100 mg via ORAL
  Filled 2015-10-17 (×3): qty 1

## 2015-10-17 MED ORDER — NICOTINE 21 MG/24HR TD PT24: 21.0000 mg | MEDICATED_PATCH | Freq: Every day | TRANSDERMAL | Status: DC

## 2015-10-17 MED ORDER — ACETAMINOPHEN 325 MG PO TABS: 325.0000 mg | ORAL_TABLET | ORAL | Status: DC | PRN

## 2015-10-17 MED ORDER — VANCOMYCIN HCL IN DEXTROSE 1-5 GM/200ML-% IV SOLN
1000.0000 mg | Freq: Once | INTRAVENOUS | Status: AC
  Administered 2015-10-17: 1000 mg via INTRAVENOUS
  Filled 2015-10-17: qty 200

## 2015-10-17 MED ORDER — METOPROLOL TARTRATE 5 MG/5ML IV SOLN
2.0000 mg | INTRAVENOUS | Status: DC | PRN
Start: 2015-10-17 — End: 2015-10-19

## 2015-10-17 MED ORDER — ACETAMINOPHEN 325 MG RE SUPP: 325.0000 mg | RECTAL | Status: DC | PRN

## 2015-10-17 MED ORDER — OXYCODONE HCL 5 MG PO TABS
5.0000 mg | ORAL_TABLET | ORAL | Status: DC | PRN
  Administered 2015-10-17 – 2015-10-19 (×11): 10 mg via ORAL
  Filled 2015-10-17 (×11): qty 2

## 2015-10-17 MED ORDER — NITROGLYCERIN 0.4 MG SL SUBL: 0.4000 mg | SUBLINGUAL_TABLET | SUBLINGUAL | Status: DC | PRN

## 2015-10-17 MED ORDER — ALUM & MAG HYDROXIDE-SIMETH 200-200-20 MG/5ML PO SUSP: 15.0000 mL | ORAL | Status: DC | PRN

## 2015-10-17 MED ORDER — SODIUM CHLORIDE 0.9% FLUSH
3.0000 mL | Freq: Two times a day (BID) | INTRAVENOUS | Status: DC
  Administered 2015-10-17: 3 mL via INTRAVENOUS

## 2015-10-17 MED ORDER — SODIUM CHLORIDE 0.9% FLUSH: 3.0000 mL | INTRAVENOUS | Status: DC | PRN

## 2015-10-17 MED ORDER — RIVAROXABAN 20 MG PO TABS
20.0000 mg | ORAL_TABLET | Freq: Every day | ORAL | Status: DC
  Administered 2015-10-17 – 2015-10-18 (×2): 20 mg via ORAL
  Filled 2015-10-17 (×2): qty 1

## 2015-10-17 MED ORDER — PIPERACILLIN-TAZOBACTAM 3.375 G IVPB
3.3750 g | Freq: Three times a day (TID) | INTRAVENOUS | Status: DC
  Administered 2015-10-17 – 2015-10-19 (×5): 3.375 g via INTRAVENOUS
  Filled 2015-10-17 (×7): qty 50

## 2015-10-17 MED ORDER — ONDANSETRON HCL 4 MG/2ML IJ SOLN: 4.0000 mg | Freq: Four times a day (QID) | INTRAMUSCULAR | Status: DC | PRN

## 2015-10-17 MED ORDER — PIPERACILLIN-TAZOBACTAM 3.375 G IVPB 30 MIN
3.3750 g | Freq: Once | INTRAVENOUS | Status: AC
  Administered 2015-10-17: 3.375 g via INTRAVENOUS
  Filled 2015-10-17: qty 50

## 2015-10-17 MED ORDER — POTASSIUM CHLORIDE CRYS ER 20 MEQ PO TBCR: 20.0000 meq | EXTENDED_RELEASE_TABLET | Freq: Once | ORAL | Status: DC

## 2015-10-17 MED ORDER — GUAIFENESIN-DM 100-10 MG/5ML PO SYRP: 15.0000 mL | ORAL_SOLUTION | ORAL | Status: DC | PRN

## 2015-10-17 MED ORDER — LABETALOL HCL 5 MG/ML IV SOLN: 10.0000 mg | INTRAVENOUS | Status: DC | PRN

## 2015-10-17 MED ORDER — DULOXETINE HCL 30 MG PO CPEP: 30.0000 mg | ORAL_CAPSULE | Freq: Every day | ORAL | Status: DC

## 2015-10-17 MED ORDER — HYDRALAZINE HCL 20 MG/ML IJ SOLN: 5.0000 mg | INTRAMUSCULAR | Status: DC | PRN

## 2015-10-17 NOTE — H&P (Signed)
Patient name: Edward [name] MRN: [id number] DOB: [birth year] Sex: male  REASON FOR CONSULT: draining and pain right BKA  HPI: Edward [name] is a 45 y.o. 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  He has had increasing pain redness and drainage from the incision over the last 48 hours.  He denies fever or chills. Other medical problems include prior DVT (xarelto), CAD, peripheral neuropathy all stable.  Past Medical History  Diagnosis Date  . Pneumothorax 2006    s/p chest tube placement x 2   . Emphysema   . DVT (deep venous thrombosis) (HCC)     right leg-takes Xarelto daily bridging with Lovenox for surgery  . Coronary artery disease   . Peripheral vascular disease (HCC)   . Myocardial infarction (HCC) 2012  . Pulmonary embolism (HCC)   . Emphysema lung (HCC)   . Shortness of breath dyspnea     with exertion  . History of bronchitis   . Vertigo     rarely but doesn't take any meds  . Peripheral neuropathy (HCC)     takes Gabapentin daily  . History of gout     not on any meds  . Depression     not on any meds  . Collagen vascular disease (HCC)   . Dizziness 07-16-2015   Past Surgical History  Procedure Laterality Date  . Chest tube insertion    . Peripheral vascular catheterization N/A 05/18/2015    Procedure: Abdominal Aortogram;  Surgeon: Nada Libman, MD;  Location: [facility];  Service: Cardiovascular;  Laterality: N/A;  . Cardiac catheterization  2012    s/p stent placement  . Coronary angioplasty with stent placement      1 stent  . Hernia repair Right     inguinal  . Femoral-popliteal bypass graft Right 05/30/2015    Procedure: RIGHT SUPERFICIAL FEMORAL TO BELOW KNEE POPLITEAL ARTERY BYPASS GRAFT;  Surgeon: Sherren Kerns, MD;  Location: [facility] OR;  Service: Vascular;  Laterality: Right;  . Vein harvest Right 05/30/2015    Procedure: RIGHT GREAT SAPHENOUS VEIN HARVEST;  Surgeon: Sherren Kerns, MD;  [facility];  Service: Vascular;  Laterality:  Right;  . Intraoperative arteriogram Right 05/30/2015    Procedure: INTRA OPERATIVE ARTERIOGRAM;  Surgeon: Sherren Kerns, MD;  Location: [facility] OR;  Service: Vascular;  Laterality: Right;  . Femoral-popliteal bypass graft Right 05/30/2015    Procedure: RIGHT BYPASS GRAFT FEMORAL-POPLITEAL ARTERY USING PROPATEN 13mmx80cm  GRAFT; WITH VEIN CUFF;  Surgeon: Sherren Kerns, MD;  Location: [facility] OR;  Service: Vascular;  Laterality: Right;  . Intraoperative arteriogram Right 05/30/2015    Procedure: INTRA OPERATIVE ARTERIOGRAM;  Surgeon: Sherren Kerns, MD;  Location: [facility] OR;  Service: Vascular;  Laterality: Right;  . Femoral-popliteal bypass graft Right 07/04/2015    Procedure: REDO RIGHT FEMORAL TO BELOW KNEE POPLITEAL BYPASS GRAFT USING CONTRALATERAL  REVERSED GREATER SAPHENOUS VEIN;  Surgeon: Sherren Kerns, MD;  Location: [facility] OR;  Service: Vascular;  Laterality: Right;  . Vein harvest Left 07/04/2015    Procedure: VEIN HARVEST LEFT GREATER SAPHENOUS ;  Surgeon: Sherren Kerns, MD;  Location: [facility] OR;  Service: Vascular;  Laterality: Left;  . Intraoperative arteriogram  07/04/2015    Procedure: INTRA OPERATIVE ARTERIOGRAM LEFT LOWER EXTREMITY;  Surgeon: Sherren Kerns, MD;  Location: [facility] OR;  Service: Vascular;;  . Thrombectomy femoral artery Left 07/04/2015    Procedure: THROMBECTOMY LEFT  TIBIAL ARTERY;  Surgeon: Janetta Hora  Hagar Sadiq, MD;  Location: [facility] OR;  Service: Vascular;  Laterality: Left;  . Below knee leg amputation Right 09/27/2015  . Amputation Right 09/27/2015    Procedure: AMPUTATION BELOW KNEE;  Surgeon: Sherren Kernsharles E Burtis Imhoff, MD;  [facility];  Service: Vascular;  Laterality: Right;    Family History  Problem Relation Age of Onset  . Family history unknown: Yes    SOCIAL HISTORY: Social History   Social History  . Marital Status: Single    Spouse Name: N/A  . Number of Children: N/A  . Years of Education: N/A   Occupational History  . Not on file.   Social History Main Topics  .  Smoking status: Current Every Day Smoker -- 1.00 packs/day for 25 years    Types: Cigarettes  . Smokeless tobacco: Never Used  . Alcohol Use: No  . Drug Use: No  . Sexual Activity: Not on file   Other Topics Concern  . Not on file   Social History Narrative    Allergies  Allergen Reactions  . Lisinopril Other (See Comments)    HYPOTENSION    Current Facility-Administered Medications  Medication Dose Route Frequency Provider Last Rate Last Dose  . 0.9 %  sodium chloride infusion  250 mL Intravenous PRN Sherren Kernsharles E Verita Kuroda, MD      . acetaminophen (TYLENOL) tablet 325-650 mg  325-650 mg Oral Q4H PRN Sherren Kernsharles E Ellyn Rubiano, MD       Or  . acetaminophen (TYLENOL) suppository 325-650 mg  325-650 mg Rectal Q4H PRN Sherren Kernsharles E Vishal Sandlin, MD      . alum & mag hydroxide-simeth (MAALOX/MYLANTA) 200-200-20 MG/5ML suspension 15-30 mL  15-30 mL Oral Q2H PRN Sherren Kernsharles E Eydan Chianese, MD      . docusate sodium (COLACE) capsule 100 mg  100 mg Oral BID Sherren Kernsharles E Brittany Amirault, MD      . heparin injection 5,000 Units  5,000 Units Subcutaneous Q8H Sherren Kernsharles E Ahad Colarusso, MD      . hydrALAZINE (APRESOLINE) injection 5 mg  5 mg Intravenous Q20 Min PRN Sherren Kernsharles E Mikylah Ackroyd, MD      . labetalol (NORMODYNE,TRANDATE) injection 10 mg  10 mg Intravenous Q10 min PRN Sherren Kernsharles E Jovonne Wilton, MD      . metoprolol (LOPRESSOR) injection 2-5 mg  2-5 mg Intravenous Q2H PRN Sherren Kernsharles E Devaughn Savant, MD      . morphine 2 MG/ML injection 2-5 mg  2-5 mg Intravenous Q1H PRN Sherren Kernsharles E Jaelynn Pozo, MD      . ondansetron [facility](ZOFRAN) injection 4 mg  4 mg Intravenous Q6H PRN Sherren Kernsharles E Santana Gosdin, MD      . oxyCODONE (Oxy IR/ROXICODONE) immediate release tablet 5-10 mg  5-10 mg Oral Q4H PRN Sherren Kernsharles E Dannie Hattabaugh, MD      . pantoprazole (PROTONIX) EC tablet 40 mg  40 mg Oral Daily Sherren Kernsharles E Lyal Husted, MD      . piperacillin-tazobactam (ZOSYN) IVPB 3.375 g  3.375 g Intravenous Q8H Sherren Kernsharles E Mauria Asquith, MD      . potassium chloride SA (K-DUR,KLOR-CON) CR tablet 20-40 mEq  20-40 mEq Oral Once Sherren Kernsharles E  Nyemah Watton, MD      . sodium chloride flush (NS) 0.9 % injection 3 mL  3 mL Intravenous Q12H Sherren Kernsharles E Deolinda Frid, MD      . sodium chloride flush (NS) 0.9 % injection 3 mL  3 mL Intravenous PRN Sherren Kernsharles E Celine Dishman, MD       Current Outpatient Prescriptions  Medication Sig Dispense Refill  . DULoxetine (CYMBALTA) 30 MG capsule Take  30 mg by mouth daily.    Marland Kitchen. gabapentin (NEURONTIN) 300 MG capsule Take 300 mg by mouth 3 (three) times daily.    Marland Kitchen. guaiFENesin-dextromethorphan (ROBITUSSIN DM) 100-10 MG/5ML syrup Take 15 mLs by mouth every 4 (four) hours as needed for cough. 118 mL 0  . nicotine (NICODERM CQ - DOSED IN MG/24 HOURS) 21 mg/24hr patch Place 1 patch (21 mg total) onto the skin daily. 28 patch 0  . nitroGLYCERIN (NITROSTAT) 0.4 MG SL tablet Place 0.4 mg under the tongue every 5 (five) minutes as needed for chest pain.     Marland Kitchen. oxyCODONE-acetaminophen (PERCOCET/ROXICET) 5-325 MG tablet Take 1-2 tablets by mouth every 4 (four) hours as needed for moderate pain. 30 tablet 0  . rivaroxaban (XARELTO) 20 MG TABS tablet Take 20 mg by mouth daily with lunch.       ROS:   General:  No weight loss, Fever, chills  HEENT: No recent headaches, no nasal bleeding, no visual changes, no sore throat  Neurologic: No dizziness, blackouts, seizures. No recent symptoms of stroke or mini- stroke. No recent episodes of slurred speech, or temporary blindness.  Cardiac: No recent episodes of chest pain/pressure, no shortness of breath at rest.  + shortness of breath with exertion.  Denies history of atrial fibrillation or irregular heartbeat  Vascular: No history of rest pain in feet.  No history of claudication.  No history of non-healing ulcer, No history of DVT   Pulmonary: No home oxygen, no productive cough, no hemoptysis,  No asthma or wheezing  Musculoskeletal:  [ ]  Arthritis, [ ]  Low back pain,  [ ]  Joint pain  Hematologic:No history of hypercoagulable state.  No history of easy bleeding.  No history of  anemia  Gastrointestinal: No hematochezia or melena,  No gastroesophageal reflux, no trouble swallowing  Urinary: [ ]  chronic Kidney disease, [ ]  on HD - [ ]  MWF or [ ]  TTHS, [ ]  Burning with urination, [ ]  Frequent urination, [ ]  Difficulty urinating;   Skin: No rashes  Psychological: + history of anxiety,  + history of depression   Physical Examination  Filed Vitals:   10/17/15 1209  BP: 130/91  Pulse: 121  Temp: 98.4 F (36.9 C)  TempSrc: Oral  Resp: 18  SpO2: 98%    There is no weight on file to calculate BMI.  General:  Alert and oriented, no acute distress HEENT: Normal Neck: No bruit or JVD Pulmonary: Clear to auscultation bilaterally Cardiac: Regular Rate and Rhythm without murmur Abdomen: Soft, non-tender, non-distended, no mass Skin: No rash, right BKA with some erythema right lateral corner about 3 cm, and 1 cm from skin edge entire incision Neurologic: Upper and lower extremity motor 5/5 and symmetric  ASSESSMENT:  Cellulitis vs ischemia right BKA   PLAN:  Admit for IV antibiotics and pain control.  Continue home meds   Fabienne Brunsharles Jash Wahlen, MD [facility] [place] Office: [phone] Pager: [phone]

## 2015-10-17 NOTE — Telephone Encounter (Signed)
Rec'd. call from pt's. Caregiver.  Reported the right BKA stump incision is more red and inflammed, and draining a dark bloody drainage.  Denied fever.  Stated the pt. Has been unable to tolerate the Ace bandage on the stump due to increased tenderness.  Reported she has been applying an antibiotic ointment, after cleaning with an antibacterial soap, daily, and it has worsened.  Notified Dr. Darrick PennaFields.  Recommended to sent pt. To the ER and to notify the ER nurse to page him, when pt. arrives.  Called pt's. caregiver.  Advised to take pt. to ER, and Dr. Darrick PennaFields will evaluate him.  Also notified nurse, Johnston EbbsKoula, [facility] re: the above.  Verb. Understanding.

## 2015-10-17 NOTE — ED Notes (Signed)
Dr. Darrick PennaFields at triage to assess wound

## 2015-10-17 NOTE — Care Management Note (Signed)
Case Management Note  Patient Details  Name: Bernette RedbirdKevin Ament MRN: [id number] Date of Birth: 1970-11-13  Subjective/Objective:        Adm w cellulits at stump site            Action/Plan:lives at home, supp mother and sister  Expected Discharge Date:                  Expected Discharge Plan:  Home w Home Health Services  In-House Referral:     Discharge planning Services  CM Consult  Post Acute Care Choice:  Resumption of Svcs/PTA Provider Choice offered to:     DME Arranged:    DME Agency:     HH Arranged:  PT HH Agency:  [facility]  Status of Service:     If discussed at Long Length of Stay Meetings, dates discussed:    Additional Comments: act w [facility] for hhpt. Alerted marie w [facility] of pt's adm.  Hanley Haysowell, Tishara Pizano T, RN 10/17/2015, 3:02 PM

## 2015-10-17 NOTE — ED Notes (Signed)
Pt. Stated, I had a below the knee amputee on June 13 and my end started to bleed on June 13.  I had a clot and it could not unclot. That's when I had to have the surgery.

## 2015-10-17 NOTE — ED Notes (Signed)
Pt. Stated, Dr. Darrick PennaFields said to call him upon arrival cause he is on call to see him.

## 2015-10-17 NOTE — Progress Notes (Signed)
Pharmacy Antibiotic Note  Edward RedbirdKevin [name] is a [age] y.o. male admitted on 10/17/2015 with cellulitis. Pt had a BKA ~3 weeks ago and has had increasing pain, redness and drainage from the incision. Pharmacy has been consulted for vancomycin and Zosyn dosing.  Plan: --Vancomycin 1000 mg IV x 1, then 750 mg IV every 12 hours --Goal trough 10-15 mcg/mL --Zosyn 3.375 g every 8 hours (1st dose over 30 min) --F/U cultures, LOT/de-escalation, trough at Css  Height: 5\' 6"  (167.6 cm) Weight: 121 lb 14.6 oz (55.3 kg) (09/27/15) IBW/kg (Calculated) : 63.8  Temp (24hrs), Avg:98.4 F (36.9 C), Min:98.4 F (36.9 C), Max:98.4 F (36.9 C)  No results for input(s): WBC, CREATININE, LATICACIDVEN, VANCOTROUGH, VANCOPEAK, VANCORANDOM, GENTTROUGH, GENTPEAK, GENTRANDOM, TOBRATROUGH, TOBRAPEAK, TOBRARND, AMIKACINPEAK, AMIKACINTROU, AMIKACIN in the last 168 hours.  Estimated Creatinine Clearance: 71 mL/min (by C-G formula based on Cr of 1.06).    Allergies  Allergen Reactions  . Lisinopril Other (See Comments)    HYPOTENSION    Antimicrobials this admission: 7/3 vancomycin >>  7/3 Zosyn >>   Dose adjustments this admission:   Microbiology results: pending  Thank you for allowing pharmacy to be a part of this patient's care.  Edward [name], PharmD Clinical Pharmacist Pager: [phone]  10/17/2015 1:20 PM

## 2015-10-18 MED ORDER — CYCLOBENZAPRINE HCL 10 MG PO TABS
10.0000 mg | ORAL_TABLET | Freq: Three times a day (TID) | ORAL | Status: DC
  Administered 2015-10-18 – 2015-10-19 (×4): 10 mg via ORAL
  Filled 2015-10-18 (×4): qty 1

## 2015-10-18 MED ORDER — OXYCODONE HCL 10 MG PO TABS: 10.0000 mg | ORAL_TABLET | Freq: Four times a day (QID) | ORAL | Status: DC | PRN

## 2015-10-18 NOTE — Progress Notes (Addendum)
  [facility] Progress Note  Subjective    Having some 8/10 pain right stump.   Objective Filed Vitals:   10/17/15 2011 10/18/15 0402  BP: 123/79 130/78  Pulse: 103 97  Temp: 98.4 F (36.9 C) 98.3 F (36.8 C)  Resp: 18 18    Intake/Output Summary (Last 24 hours) at 10/18/15 0758 Last data filed at 10/17/15 2013  Gross per 24 hour  Intake    480 ml  Output      0 ml  Net    480 ml   Right BKA without active drainage. Some dried blood from incision on ABD pad. Very mild erythema. Some swelling of stump.   Assessment/Planning: 45 y.o. male with cellulitis vs ischemia right BKA  Continue antibiotics and pain control.  Erythema improved.  Plan for d/c tomorrow.   Raymond GurneyKimberly A Trinh 10/18/2015 7:58 AM -- Agree with above. Most likely d/c on Keflex.  Fabienne Brunsharles Fields, MD [facility] [place] Office: [phone] Pager: [phone]  Laboratory CBC    Component Value Date/Time   WBC 7.8 10/17/2015 1309   WBC 6.7 06/22/2011 1506   HGB 15.0 10/17/2015 1309   HGB 16.2 06/22/2011 1506   HCT 45.0 10/17/2015 1309   HCT 46.3 06/22/2011 1506   PLT 531* 10/17/2015 1309   PLT 192 06/22/2011 1506    BMET    Component Value Date/Time   NA 135 10/17/2015 1309   NA 143 06/22/2011 1506   K 4.4 10/17/2015 1309   K 3.9 06/22/2011 1506   CL 100* 10/17/2015 1309   CL 106 06/22/2011 1506   CO2 25 10/17/2015 1309   CO2 27 06/22/2011 1506   GLUCOSE 108* 10/17/2015 1309   GLUCOSE 84 06/22/2011 1506   BUN 11 10/17/2015 1309   BUN 16 06/22/2011 1506   CREATININE 0.89 10/17/2015 1309   CREATININE 1.11 06/22/2011 1506   CALCIUM 9.4 10/17/2015 1309   CALCIUM 9.1 06/22/2011 1506   GFRNONAA >60 10/17/2015 1309   GFRNONAA >60 06/22/2011 1506   GFRAA >60 10/17/2015 1309   GFRAA >60 06/22/2011 1506    COAG Lab Results  Component Value Date   INR 1.04 09/27/2015   INR 1.42 06/30/2015   INR 1.02 05/26/2015   No results found for:  PTT  Antibiotics Anti-infectives    Start     Dose/Rate Route Frequency Ordered Stop   10/18/15 0400  vancomycin (VANCOCIN) IVPB 750 mg/150 ml premix     750 mg 150 mL/hr over 60 Minutes Intravenous Every 12 hours 10/17/15 1550     10/18/15 0200  vancomycin (VANCOCIN) IVPB 750 mg/150 ml premix  Status:  Discontinued     750 mg 150 mL/hr over 60 Minutes Intravenous Every 12 hours 10/17/15 1318 10/17/15 1550   10/17/15 2130  piperacillin-tazobactam (ZOSYN) IVPB 3.375 g     3.375 g 12.5 mL/hr over 240 Minutes Intravenous Every 8 hours 10/17/15 1254     10/17/15 1345  vancomycin (VANCOCIN) IVPB 1000 mg/200 mL premix     1,000 mg 200 mL/hr over 60 Minutes Intravenous  Once 10/17/15 1313 10/17/15 1810   10/17/15 1315  piperacillin-tazobactam (ZOSYN) IVPB 3.375 g     3.375 g 100 mL/hr over 30 Minutes Intravenous  Once 10/17/15 1311 10/17/15 1640       Maris BergerKimberly Trinh, PA-C [facility] Office: [phone] Pager: [phone] 10/18/2015 7:58 AM

## 2015-10-19 MED ORDER — CEPHALEXIN 500 MG PO CAPS: 500.0000 mg | ORAL_CAPSULE | Freq: Four times a day (QID) | ORAL | Status: DC

## 2015-10-19 NOTE — Progress Notes (Addendum)
  Progress Note    10/19/2015 7:28 AM   Subjective:  Pt states he still is having pain issues; says he could stand something a little bit stronger.  Afebrile HR  90's-100's NSR 120's systolic 97% RA  Filed Vitals:   10/18/15 1939 10/19/15 0519  BP: 121/75 125/83  Pulse: 102 92  Temp: 98 F (36.7 C) 98.5 F (36.9 C)  Resp: 18 18    Physical Exam: Incisions:  Clean and dry-non drainage noted;  Mild erythema around the staple edges; dried blood throughout staple line.  Swelling appears improved from when I saw him in the hospital his last visit. Extremities:  Good ROM of right leg.  CBC    Component Value Date/Time   WBC 7.8 10/17/2015 1309   WBC 6.7 06/22/2011 1506   RBC 4.72 10/17/2015 1309   RBC 4.77 06/22/2011 1506   HGB 15.0 10/17/2015 1309   HGB 16.2 06/22/2011 1506   HCT 45.0 10/17/2015 1309   HCT 46.3 06/22/2011 1506   PLT 531* 10/17/2015 1309   PLT 192 06/22/2011 1506   MCV 95.3 10/17/2015 1309   MCV 97 06/22/2011 1506   MCH 31.8 10/17/2015 1309   MCH 34.0 06/22/2011 1506   MCHC 33.3 10/17/2015 1309   MCHC 34.9 06/22/2011 1506   RDW 12.8 10/17/2015 1309   RDW 12.4 06/22/2011 1506   LYMPHSABS 3.0 06/30/2015 2115   MONOABS 0.4 06/30/2015 2115   EOSABS 0.3 06/30/2015 2115   BASOSABS 0.0 06/30/2015 2115    BMET    Component Value Date/Time   NA 135 10/17/2015 1309   NA 143 06/22/2011 1506   K 4.4 10/17/2015 1309   K 3.9 06/22/2011 1506   CL 100* 10/17/2015 1309   CL 106 06/22/2011 1506   CO2 25 10/17/2015 1309   CO2 27 06/22/2011 1506   GLUCOSE 108* 10/17/2015 1309   GLUCOSE 84 06/22/2011 1506   BUN 11 10/17/2015 1309   BUN 16 06/22/2011 1506   CREATININE 0.89 10/17/2015 1309   CREATININE 1.11 06/22/2011 1506   CALCIUM 9.4 10/17/2015 1309   CALCIUM 9.1 06/22/2011 1506   GFRNONAA >60 10/17/2015 1309   GFRNONAA >60 06/22/2011 1506   GFRAA >60 10/17/2015 1309   GFRAA >60 06/22/2011 1506    INR    Component Value Date/Time   INR 1.04  09/27/2015 0941     Intake/Output Summary (Last 24 hours) at 10/19/15 0728 Last data filed at 10/19/15 0511  Gross per 24 hour  Intake    720 ml  Output   1825 ml  Net  -1105 ml     Assessment/Plan:  45 y.o. male is s/p right below knee amputation    -stump is viable -continue Abx-will plan d/c with Keflex   Doreatha MassedSamantha Rhyne, PA-C [facility] [phone] 10/19/2015 7:28 AM   Erythema better.  Pain better. D/c home today  Fabienne Brunsharles Starnisha Batrez, MD [facility] [place] Office: [phone] Pager: [phone]

## 2015-10-19 NOTE — Progress Notes (Signed)
Orders received to discharge patient.  Patient expresses readiness to discharge.  Discharge instructions, follow up, medications and instructions for their use were explained to patient and patient voiced understanding.  Telemetry monitor removed and CCMD notified.

## 2015-10-19 NOTE — Discharge Summary (Signed)
Discharge Summary    Edward RedbirdKevin [name] Jul 16, 1970 45 y.o. male  [id number]  Admission Date: 10/17/2015  Discharge Date: 10/19/15  Physician: Sherren Kernsharles E Fields, MD  Admission Diagnosis: POST SURGICAL NOW BLEEDING AT THE SITE SENT BY DR   HPI:   This is a 45 y.o. 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 He has had increasing pain redness and drainage from the incision over the last 48 hours. He denies fever or chills. Other medical problems include prior DVT (xarelto), CAD, peripheral neuropathy all stable.  Hospital Course:  The patient was admitted to the hospital with cellulitis vs ischemia of right BKa and admitted for IV ABx & medication for pain control.    By HD 1, he was doing better.  Pain was better controlled and erythema had improved.  Plan was for discharge the next morning.  By HD 2, he was doing better.  His erythema and pain continued to be better.  He is discharged home on Keflex 500mg  tid x 2 weeks.  The remainder of the hospital course consisted of increasing mobilization and increasing intake of solids without difficulty.  CBC    Component Value Date/Time   WBC 7.8 10/17/2015 1309   WBC 6.7 06/22/2011 1506   RBC 4.72 10/17/2015 1309   RBC 4.77 06/22/2011 1506   HGB 15.0 10/17/2015 1309   HGB 16.2 06/22/2011 1506   HCT 45.0 10/17/2015 1309   HCT 46.3 06/22/2011 1506   PLT 531* 10/17/2015 1309   PLT 192 06/22/2011 1506   MCV 95.3 10/17/2015 1309   MCV 97 06/22/2011 1506   MCH 31.8 10/17/2015 1309   MCH 34.0 06/22/2011 1506   MCHC 33.3 10/17/2015 1309   MCHC 34.9 06/22/2011 1506   RDW 12.8 10/17/2015 1309   RDW 12.4 06/22/2011 1506   LYMPHSABS 3.0 06/30/2015 2115   MONOABS 0.4 06/30/2015 2115   EOSABS 0.3 06/30/2015 2115   BASOSABS 0.0 06/30/2015 2115    BMET    Component Value Date/Time   NA 135 10/17/2015 1309   NA 143 06/22/2011 1506   K 4.4 10/17/2015 1309   K 3.9 06/22/2011 1506   CL 100* 10/17/2015 1309   CL 106 06/22/2011 1506   CO2  25 10/17/2015 1309   CO2 27 06/22/2011 1506   GLUCOSE 108* 10/17/2015 1309   GLUCOSE 84 06/22/2011 1506   BUN 11 10/17/2015 1309   BUN 16 06/22/2011 1506   CREATININE 0.89 10/17/2015 1309   CREATININE 1.11 06/22/2011 1506   CALCIUM 9.4 10/17/2015 1309   CALCIUM 9.1 06/22/2011 1506   GFRNONAA >60 10/17/2015 1309   GFRNONAA >60 06/22/2011 1506   GFRAA >60 10/17/2015 1309   GFRAA >60 06/22/2011 1506      Discharge Instructions    Call MD for:  redness, tenderness, or signs of infection (pain, swelling, bleeding, redness, odor or green/yellow discharge around incision site)    Complete by:  As directed      Call MD for:  severe or increased pain, loss or decreased feeling  in affected limb(s)    Complete by:  As directed      Call MD for:  temperature >100.5    Complete by:  As directed      Discharge wound care:    Complete by:  As directed   Wash right leg with soap and water daily and pat dry. Apply dry gauze to incision and wrap with kerlix daily.     Driving Restrictions    Complete  by:  As directed   No driving for 2 weeks     Increase activity slowly    Complete by:  As directed   Walk with assistance use walker or cane as needed     Resume previous diet    Complete by:  As directed            Discharge Diagnosis:  POST SURGICAL NOW BLEEDING AT THE SITE SENT BY DR  Secondary Diagnosis: Patient Active Problem List   Diagnosis Date Noted  . Cellulitis of right leg without foot 10/17/2015  . Ischemic rest pain of lower extremity (HCC) 09/27/2015  . PAD (peripheral artery disease) (HCC) 07/04/2015  . Atherosclerosis of extremity with rest pain (HCC) 07/01/2015  . Atherosclerotic peripheral vascular disease of extremity (HCC) 05/30/2015  . Pre-operative cardiovascular examination 05/26/2015  . Pulmonary embolism (HCC) 09/17/2014  . NSTEMI (non-ST elevated myocardial infarction) (HCC) 05/02/2011  . Hyperlipidemia 05/02/2011  . Gout 05/02/2011  . CAD (coronary  artery disease) 10/13/2010  . Smoking 10/13/2010   Past Medical History  Diagnosis Date  . Pneumothorax 2006    s/p chest tube placement x 2   . Emphysema   . DVT (deep venous thrombosis) (HCC)     right leg-takes Xarelto daily bridging with Lovenox for surgery  . Coronary artery disease   . Peripheral vascular disease (HCC)   . Pulmonary embolism (HCC) 2016  . Emphysema lung (HCC)   . Shortness of breath dyspnea     with exertion  . Vertigo     rarely but doesn't take any meds  . Peripheral neuropathy (HCC)     takes Gabapentin daily  . History of gout     not on any meds  . Collagen vascular disease (HCC)   . Dizziness 07-16-2015  . Myocardial infarction (HCC) 2012  . Depression   . Arthritis     "hands" (10/17/2015)       Medication List    STOP taking these medications        oxyCODONE-acetaminophen 5-325 MG tablet  Commonly known as:  PERCOCET/ROXICET      TAKE these medications        cephALEXin 500 MG capsule  Commonly known as:  KEFLEX  Take 1 capsule (500 mg total) by mouth 4 (four) times daily.     DULoxetine 60 MG capsule  Commonly known as:  CYMBALTA  Take 60 mg by mouth daily.     gabapentin 300 MG capsule  Commonly known as:  NEURONTIN  Take 300 mg by mouth 3 (three) times daily.     nitroGLYCERIN 0.4 MG SL tablet  Commonly known as:  NITROSTAT  Place 0.4 mg under the tongue every 5 (five) minutes as needed for chest pain.     Oxycodone HCl 10 MG Tabs  Take 1 tablet (10 mg total) by mouth every 6 (six) hours as needed.     rivaroxaban 20 MG Tabs tablet  Commonly known as:  XARELTO  Take 20 mg by mouth daily with lunch.        Prescriptions given: Oxycodone #15 No Refill  Instructions: 1.  Shower daily with soap and water starting 10/19/15  Disposition: home  Patient's condition: is Good  Follow up: 1. Dr. Darrick PennaFields in 2 weeks   Doreatha MassedSamantha Aizlynn Digilio, PA-C [facility] [phone] 10/19/2015  12:46 PM

## 2015-10-20 ENCOUNTER — Telehealth: Payer: Self-pay | Admitting: *Deleted

## 2015-10-20 DIAGNOSIS — T8140XD Infection following a procedure, unspecified, subsequent encounter: Secondary | ICD-10-CM

## 2015-10-20 MED ORDER — LEVOFLOXACIN 500 MG PO TABS: 500.0000 mg | ORAL_TABLET | Freq: Two times a day (BID) | ORAL | Status: DC

## 2015-10-20 NOTE — Telephone Encounter (Signed)
Edward [name], [facility] called to let Dr. Darrick PennaFields know that Levaquin 500mg  is not dosed at BID. Per Dr. Darrick PennaFields the patient will be taking Levaquin 750mg  once a day. Pharmacist will tell patient's family when they pick up medication this afternoon.

## 2015-10-20 NOTE — Telephone Encounter (Signed)
Patient's girlfriend, [name], called to report Edward [name] is having N&V after 2 doses of Keflex. Per Dr. Darrick PennaFields, patient was switched to Levaquin 500mg  BID x 10 days. [facility] [place] per request. [name] will pick up this afternoon and voiced understanding that Edward [name] will take this 2 times a day instead of the 4 times a day with the Keflex.

## 2015-10-21 ENCOUNTER — Encounter: Payer: Self-pay | Admitting: Vascular Surgery

## 2015-10-24 ENCOUNTER — Telehealth: Payer: Self-pay

## 2015-10-24 NOTE — Telephone Encounter (Signed)
rec'd voice message from caregiver on the Triage phone line reporting pt. Has no energy, continued drainage of right BKA stump, and tender areas @ stump incision.  Also reported that "the pt. Is planning to restart the Keflex tomorrow, because the Levaquin isn't helping."  Unable to reach caregiver at this time; left voice message that nurse will try to contact her in the morning.

## 2015-10-25 ENCOUNTER — Encounter (HOSPITAL_COMMUNITY): Payer: Self-pay | Admitting: Emergency Medicine

## 2015-10-25 ENCOUNTER — Inpatient Hospital Stay (HOSPITAL_COMMUNITY): Payer: No Typology Code available for payment source

## 2015-10-25 ENCOUNTER — Inpatient Hospital Stay (HOSPITAL_COMMUNITY)
Admission: EM | Admit: 2015-10-25 | Discharge: 2015-11-01 | DRG: 475 | Disposition: A | Discharge status: Home / Self Care | Payer: No Typology Code available for payment source | Attending: Vascular Surgery | Admitting: Vascular Surgery

## 2015-10-25 DIAGNOSIS — Z86718 Personal history of other venous thrombosis and embolism: Secondary | ICD-10-CM | POA: Diagnosis not present

## 2015-10-25 DIAGNOSIS — F17210 Nicotine dependence, cigarettes, uncomplicated: Secondary | ICD-10-CM | POA: Diagnosis present

## 2015-10-25 DIAGNOSIS — E785 Hyperlipidemia, unspecified: Secondary | ICD-10-CM | POA: Diagnosis present

## 2015-10-25 DIAGNOSIS — Z955 Presence of coronary angioplasty implant and graft: Secondary | ICD-10-CM

## 2015-10-25 DIAGNOSIS — M79604 Pain in right leg: Secondary | ICD-10-CM | POA: Diagnosis not present

## 2015-10-25 DIAGNOSIS — T8781 Dehiscence of amputation stump: Secondary | ICD-10-CM | POA: Diagnosis not present

## 2015-10-25 DIAGNOSIS — Y835 Amputation of limb(s) as the cause of abnormal reaction of the patient, or of later complication, without mention of misadventure at the time of the procedure: Secondary | ICD-10-CM | POA: Diagnosis present

## 2015-10-25 DIAGNOSIS — I252 Old myocardial infarction: Secondary | ICD-10-CM

## 2015-10-25 DIAGNOSIS — M62261 Nontraumatic ischemic infarction of muscle, right lower leg: Secondary | ICD-10-CM | POA: Diagnosis present

## 2015-10-25 DIAGNOSIS — J439 Emphysema, unspecified: Secondary | ICD-10-CM | POA: Diagnosis present

## 2015-10-25 DIAGNOSIS — Z86711 Personal history of pulmonary embolism: Secondary | ICD-10-CM

## 2015-10-25 DIAGNOSIS — T8743 Infection of amputation stump, right lower extremity: Principal | ICD-10-CM | POA: Diagnosis present

## 2015-10-25 DIAGNOSIS — M109 Gout, unspecified: Secondary | ICD-10-CM | POA: Diagnosis present

## 2015-10-25 DIAGNOSIS — I2510 Atherosclerotic heart disease of native coronary artery without angina pectoris: Secondary | ICD-10-CM | POA: Diagnosis present

## 2015-10-25 DIAGNOSIS — Z79899 Other long term (current) drug therapy: Secondary | ICD-10-CM | POA: Diagnosis not present

## 2015-10-25 DIAGNOSIS — F329 Major depressive disorder, single episode, unspecified: Secondary | ICD-10-CM | POA: Diagnosis present

## 2015-10-25 DIAGNOSIS — G629 Polyneuropathy, unspecified: Secondary | ICD-10-CM | POA: Diagnosis present

## 2015-10-25 DIAGNOSIS — S8010XA Contusion of unspecified lower leg, initial encounter: Secondary | ICD-10-CM | POA: Diagnosis present

## 2015-10-25 DIAGNOSIS — Z7901 Long term (current) use of anticoagulants: Secondary | ICD-10-CM | POA: Diagnosis not present

## 2015-10-25 DIAGNOSIS — L03115 Cellulitis of right lower limb: Secondary | ICD-10-CM | POA: Diagnosis present

## 2015-10-25 DIAGNOSIS — G546 Phantom limb syndrome with pain: Secondary | ICD-10-CM | POA: Diagnosis present

## 2015-10-25 DIAGNOSIS — Z515 Encounter for palliative care: Secondary | ICD-10-CM | POA: Diagnosis not present

## 2015-10-25 DIAGNOSIS — R609 Edema, unspecified: Secondary | ICD-10-CM

## 2015-10-25 DIAGNOSIS — I739 Peripheral vascular disease, unspecified: Secondary | ICD-10-CM | POA: Insufficient documentation

## 2015-10-25 HISTORY — DX: Other injury of unspecified body region, initial encounter: T14.8XXA

## 2015-10-25 LAB — COMPREHENSIVE METABOLIC PANEL
ALT: 13 U/L — ABNORMAL LOW (ref 17–63)
AST: 20 U/L (ref 15–41)
Albumin: 3 g/dL — ABNORMAL LOW (ref 3.5–5.0)
Alkaline Phosphatase: 127 U/L — ABNORMAL HIGH (ref 38–126)
Anion gap: 9 (ref 5–15)
BUN: 10 mg/dL (ref 6–20)
CO2: 29 mmol/L (ref 22–32)
Calcium: 9.1 mg/dL (ref 8.9–10.3)
Chloride: 95 mmol/L — ABNORMAL LOW (ref 101–111)
Creatinine, Ser: 0.86 mg/dL (ref 0.61–1.24)
GFR calc Af Amer: >60 mL/min (ref >60)
GFR calc non Af Amer: >60 mL/min (ref >60)
Glucose, Bld: 110 mg/dL — ABNORMAL HIGH (ref 65–99)
Potassium: 4.2 mmol/L (ref 3.5–5.1)
Sodium: 133 mmol/L — ABNORMAL LOW (ref 135–145)
Total Bilirubin: 0.8 mg/dL (ref 0.3–1.2)
Total Protein: 7.5 g/dL (ref 6.5–8.1)

## 2015-10-25 LAB — URINALYSIS, ROUTINE W REFLEX MICROSCOPIC
Bilirubin Urine: NEGATIVE
Glucose, UA: NEGATIVE mg/dL
Hgb urine dipstick: NEGATIVE
Ketones, ur: NEGATIVE mg/dL
Leukocytes, UA: NEGATIVE
Nitrite: NEGATIVE
Protein, ur: NEGATIVE mg/dL
Specific Gravity, Urine: 1.015 (ref 1.005–1.030)
pH: 7.5 (ref 5.0–8.0)

## 2015-10-25 LAB — CBC WITH DIFFERENTIAL/PLATELET
Basophils Absolute: 0 10*3/uL (ref 0.0–0.1)
Basophils Relative: 0 %
Eosinophils Absolute: 0.1 10*3/uL (ref 0.0–0.7)
Eosinophils Relative: 1 %
HCT: 39.6 % (ref 39.0–52.0)
Hemoglobin: 13.2 g/dL (ref 13.0–17.0)
Lymphocytes Relative: 11 %
Lymphs Abs: 1.6 10*3/uL (ref 0.7–4.0)
MCH: 31.7 pg (ref 26.0–34.0)
MCHC: 33.3 g/dL (ref 30.0–36.0)
MCV: 95 fL (ref 78.0–100.0)
Monocytes Absolute: 1.2 10*3/uL — ABNORMAL HIGH (ref 0.1–1.0)
Monocytes Relative: 8 %
Neutro Abs: 11.8 10*3/uL — ABNORMAL HIGH (ref 1.7–7.7)
Neutrophils Relative %: 80 %
Platelets: 292 10*3/uL (ref 150–400)
RBC: 4.17 MIL/uL — ABNORMAL LOW (ref 4.22–5.81)
RDW: 13.1 % (ref 11.5–15.5)
WBC: 14.7 10*3/uL — ABNORMAL HIGH (ref 4.0–10.5)

## 2015-10-25 LAB — I-STAT CG4 LACTIC ACID, ED: Lactic Acid, Venous: 1.16 mmol/L (ref 0.5–1.9)

## 2015-10-25 MED ORDER — HYDRALAZINE HCL 20 MG/ML IJ SOLN: 5.0000 mg | INTRAMUSCULAR | Status: DC | PRN

## 2015-10-25 MED ORDER — ONDANSETRON HCL 4 MG/2ML IJ SOLN: 4.0000 mg | Freq: Four times a day (QID) | INTRAMUSCULAR | Status: DC | PRN

## 2015-10-25 MED ORDER — GUAIFENESIN-DM 100-10 MG/5ML PO SYRP: 15.0000 mL | ORAL_SOLUTION | ORAL | Status: DC | PRN

## 2015-10-25 MED ORDER — DOCUSATE SODIUM 100 MG PO CAPS
100.0000 mg | ORAL_CAPSULE | Freq: Two times a day (BID) | ORAL | Status: DC
  Administered 2015-10-25 – 2015-10-27 (×6): 100 mg via ORAL
  Filled 2015-10-25 (×8): qty 1

## 2015-10-25 MED ORDER — HEPARIN SODIUM (PORCINE) 5000 UNIT/ML IJ SOLN
5000.0000 [IU] | Freq: Three times a day (TID) | INTRAMUSCULAR | Status: DC
  Administered 2015-10-25 – 2015-10-27 (×6): 5000 [IU] via SUBCUTANEOUS
  Filled 2015-10-25 (×8): qty 1

## 2015-10-25 MED ORDER — DULOXETINE HCL 60 MG PO CPEP
60.0000 mg | ORAL_CAPSULE | Freq: Every day | ORAL | Status: DC
  Administered 2015-10-26 – 2015-11-01 (×6): 60 mg via ORAL
  Filled 2015-10-25 (×6): qty 1

## 2015-10-25 MED ORDER — VANCOMYCIN HCL IN DEXTROSE 1-5 GM/200ML-% IV SOLN
1000.0000 mg | Freq: Once | INTRAVENOUS | Status: AC
  Administered 2015-10-25: 1000 mg via INTRAVENOUS
  Filled 2015-10-25: qty 200

## 2015-10-25 MED ORDER — POTASSIUM CHLORIDE CRYS ER 20 MEQ PO TBCR: 20.0000 meq | EXTENDED_RELEASE_TABLET | Freq: Once | ORAL | Status: DC

## 2015-10-25 MED ORDER — SODIUM CHLORIDE 0.9 % IV BOLUS (SEPSIS)
500.0000 mL | Freq: Once | INTRAVENOUS | Status: AC
  Administered 2015-10-25: 500 mL via INTRAVENOUS

## 2015-10-25 MED ORDER — VANCOMYCIN HCL IN DEXTROSE 750-5 MG/150ML-% IV SOLN
750.0000 mg | Freq: Two times a day (BID) | INTRAVENOUS | Status: DC
  Administered 2015-10-26 – 2015-10-29 (×7): 750 mg via INTRAVENOUS
  Filled 2015-10-25 (×8): qty 150

## 2015-10-25 MED ORDER — PIPERACILLIN-TAZOBACTAM 3.375 G IVPB
3.3750 g | Freq: Three times a day (TID) | INTRAVENOUS | Status: DC
  Administered 2015-10-25 – 2015-10-29 (×11): 3.375 g via INTRAVENOUS
  Filled 2015-10-25 (×13): qty 50

## 2015-10-25 MED ORDER — PIPERACILLIN-TAZOBACTAM 3.375 G IVPB: 3.3750 g | Freq: Three times a day (TID) | INTRAVENOUS | Status: DC

## 2015-10-25 MED ORDER — GABAPENTIN 300 MG PO CAPS
300.0000 mg | ORAL_CAPSULE | Freq: Three times a day (TID) | ORAL | Status: DC
  Administered 2015-10-25 – 2015-11-01 (×20): 300 mg via ORAL
  Filled 2015-10-25 (×20): qty 1

## 2015-10-25 MED ORDER — PANTOPRAZOLE SODIUM 40 MG PO TBEC
40.0000 mg | DELAYED_RELEASE_TABLET | Freq: Every day | ORAL | Status: DC
  Administered 2015-10-25 – 2015-11-01 (×7): 40 mg via ORAL
  Filled 2015-10-25 (×7): qty 1

## 2015-10-25 MED ORDER — MORPHINE SULFATE (PF) 2 MG/ML IV SOLN
2.0000 mg | INTRAVENOUS | Status: DC | PRN
Start: 2015-10-25 — End: 2015-10-26
  Administered 2015-10-25 (×2): 2 mg via INTRAVENOUS
  Administered 2015-10-25 – 2015-10-26 (×4): 4 mg via INTRAVENOUS
  Filled 2015-10-25: qty 1
  Filled 2015-10-25: qty 2
  Filled 2015-10-25: qty 1
  Filled 2015-10-25 (×3): qty 2

## 2015-10-25 MED ORDER — METRONIDAZOLE IN NACL 5-0.79 MG/ML-% IV SOLN: 500.0000 mg | Freq: Once | INTRAVENOUS | Status: DC

## 2015-10-25 MED ORDER — NITROGLYCERIN 0.4 MG SL SUBL: 0.4000 mg | SUBLINGUAL_TABLET | SUBLINGUAL | Status: DC | PRN

## 2015-10-25 MED ORDER — SODIUM CHLORIDE 0.9 % IV BOLUS (SEPSIS)
1000.0000 mL | Freq: Once | INTRAVENOUS | Status: AC
  Administered 2015-10-25: 1000 mL via INTRAVENOUS

## 2015-10-25 MED ORDER — OXYCODONE-ACETAMINOPHEN 5-325 MG PO TABS
1.0000 | ORAL_TABLET | ORAL | Status: DC | PRN
  Administered 2015-10-25 – 2015-10-30 (×6): 2 via ORAL
  Filled 2015-10-25 (×7): qty 2

## 2015-10-25 MED ORDER — ACETAMINOPHEN 325 MG PO TABS: 325.0000 mg | ORAL_TABLET | ORAL | Status: DC | PRN

## 2015-10-25 MED ORDER — PHENOL 1.4 % MT LIQD: 1.0000 | OROMUCOSAL | Status: DC | PRN

## 2015-10-25 MED ORDER — SODIUM CHLORIDE 0.9 % IV SOLN: 250.0000 mL | INTRAVENOUS | Status: DC | PRN

## 2015-10-25 MED ORDER — PIPERACILLIN-TAZOBACTAM 3.375 G IVPB 30 MIN
3.3750 g | Freq: Once | INTRAVENOUS | Status: AC
  Administered 2015-10-25: 3.375 g via INTRAVENOUS
  Filled 2015-10-25: qty 50

## 2015-10-25 MED ORDER — PREGABALIN 75 MG PO CAPS
75.0000 mg | ORAL_CAPSULE | Freq: Two times a day (BID) | ORAL | Status: DC
  Administered 2015-10-25 – 2015-11-01 (×13): 75 mg via ORAL
  Filled 2015-10-25 (×13): qty 1

## 2015-10-25 MED ORDER — SODIUM CHLORIDE 0.9 % IV BOLUS (SEPSIS)
250.0000 mL | Freq: Once | INTRAVENOUS | Status: AC
  Administered 2015-10-25: 250 mL via INTRAVENOUS

## 2015-10-25 MED ORDER — ACETAMINOPHEN 325 MG RE SUPP: 325.0000 mg | RECTAL | Status: DC | PRN

## 2015-10-25 MED ORDER — SODIUM CHLORIDE 0.9% FLUSH
3.0000 mL | INTRAVENOUS | Status: DC | PRN
  Administered 2015-10-31: 3 mL via INTRAVENOUS
  Filled 2015-10-25: qty 3

## 2015-10-25 MED ORDER — LABETALOL HCL 5 MG/ML IV SOLN: 10.0000 mg | INTRAVENOUS | Status: DC | PRN

## 2015-10-25 MED ORDER — SODIUM CHLORIDE 0.9% FLUSH
3.0000 mL | Freq: Two times a day (BID) | INTRAVENOUS | Status: DC
  Administered 2015-10-25 – 2015-11-01 (×6): 3 mL via INTRAVENOUS

## 2015-10-25 MED ORDER — AZTREONAM 2 G IJ SOLR: 2.0000 g | Freq: Once | INTRAMUSCULAR | Status: DC

## 2015-10-25 MED ORDER — HYDROMORPHONE HCL 1 MG/ML IJ SOLN
1.0000 mg | Freq: Once | INTRAMUSCULAR | Status: AC
  Administered 2015-10-25: 1 mg via INTRAVENOUS
  Filled 2015-10-25: qty 1

## 2015-10-25 MED ORDER — ALUM & MAG HYDROXIDE-SIMETH 200-200-20 MG/5ML PO SUSP: 15.0000 mL | ORAL | Status: DC | PRN

## 2015-10-25 MED ORDER — METOPROLOL TARTRATE 5 MG/5ML IV SOLN
2.0000 mg | INTRAVENOUS | Status: AC | PRN
Start: 2015-10-25 — End: 2015-10-26
  Administered 2015-10-25: 5 mg via INTRAVENOUS
  Administered 2015-10-26: 2.5 mg via INTRAVENOUS
  Administered 2015-10-26: 1 mg via INTRAVENOUS
  Filled 2015-10-25: qty 5

## 2015-10-25 NOTE — ED Notes (Signed)
Report attempted 

## 2015-10-25 NOTE — ED Provider Notes (Signed)
CSN: [id number]     Arrival date & time 10/25/15  1044 History   First MD Initiated Contact with Patient 10/25/15 1114     Chief Complaint  Patient presents with  . Knee Pain     HPI Patient presents to the emergency room for evaluation of increasing pain in his right leg patient has a history of below the knee amputation of his right lower extremity.  Patient has history of peripheral vascular disease. He had femoropopliteal bypass surgery in March. Patient ended up developing recurrent limb ischemia. He subsequently had a below the knee amputation in June of this year. Patient started having increasing pain and redness and he was readmitted to the hospital about a week or 2 ago. He was released on July 5. At that time the patient was started on antibiotics for possible infection in his symptoms improved. Patient states that about 3 days ago he started having increasing pain again. He's had increasing redness and some drainage. The wound is very tender to the touch. He is also noticed increasing swelling. He developed a fever up to 101 the other day. Past Medical History  Diagnosis Date  . Pneumothorax 2006    s/p chest tube placement x 2   . Emphysema   . DVT (deep venous thrombosis) (HCC)     right leg-takes Xarelto daily bridging with Lovenox for surgery  . Coronary artery disease   . Peripheral vascular disease (HCC)   . Pulmonary embolism (HCC) 2016  . Emphysema lung (HCC)   . Shortness of breath dyspnea     with exertion  . Vertigo     rarely but doesn't take any meds  . Peripheral neuropathy (HCC)     takes Gabapentin daily  . History of gout     not on any meds  . Collagen vascular disease (HCC)   . Dizziness 07-16-2015  . Myocardial infarction (HCC) 2012  . Depression   . Arthritis     "hands" (10/17/2015)   Past Surgical History  Procedure Laterality Date  . Chest tube insertion    . Peripheral vascular catheterization N/A 05/18/2015    Procedure: Abdominal Aortogram;   Surgeon: Nada Libman, MD;  Location: [facility];  Service: Cardiovascular;  Laterality: N/A;  . Cardiac catheterization  2012    s/p stent placement  . Coronary angioplasty with stent placement      1 stent  . Femoral-popliteal bypass graft Right 05/30/2015    Procedure: RIGHT SUPERFICIAL FEMORAL TO BELOW KNEE POPLITEAL ARTERY BYPASS GRAFT;  Surgeon: Sherren Kerns, MD;  Location: [facility] OR;  Service: Vascular;  Laterality: Right;  . Vein harvest Right 05/30/2015    Procedure: RIGHT GREAT SAPHENOUS VEIN HARVEST;  Surgeon: Sherren Kerns, MD;  Location: [facility] OR;  Service: Vascular;  Laterality: Right;  . Intraoperative arteriogram Right 05/30/2015    Procedure: INTRA OPERATIVE ARTERIOGRAM;  Surgeon: Sherren Kerns, MD;  Location: [facility] OR;  Service: Vascular;  Laterality: Right;  . Femoral-popliteal bypass graft Right 05/30/2015    Procedure: RIGHT BYPASS GRAFT FEMORAL-POPLITEAL ARTERY USING PROPATEN 17mmx80cm  GRAFT; WITH VEIN CUFF;  Surgeon: Sherren Kerns, MD;  Location: [facility] OR;  Service: Vascular;  Laterality: Right;  . Intraoperative arteriogram Right 05/30/2015    Procedure: INTRA OPERATIVE ARTERIOGRAM;  Surgeon: Sherren Kerns, MD;  Location: [facility] OR;  Service: Vascular;  Laterality: Right;  . Femoral-popliteal bypass graft Right 07/04/2015    Procedure: REDO RIGHT FEMORAL TO BELOW KNEE POPLITEAL  BYPASS GRAFT USING CONTRALATERAL  REVERSED GREATER SAPHENOUS VEIN;  Surgeon: Sherren Kerns, MD;  Location: [facility] OR;  Service: Vascular;  Laterality: Right;  . Vein harvest Left 07/04/2015    Procedure: VEIN HARVEST LEFT GREATER SAPHENOUS ;  Surgeon: Sherren Kerns, MD;  Location: [facility] OR;  Service: Vascular;  Laterality: Left;  . Intraoperative arteriogram  07/04/2015    Procedure: INTRA OPERATIVE ARTERIOGRAM LEFT LOWER EXTREMITY;  Surgeon: Sherren Kerns, MD;  Location: [facility] OR;  Service: Vascular;;  . Thrombectomy femoral artery Left 07/04/2015    Procedure: THROMBECTOMY LEFT  TIBIAL ARTERY;   Surgeon: Sherren Kerns, MD;  Location: [facility] OR;  Service: Vascular;  Laterality: Left;  . Amputation Right 09/27/2015    Procedure: AMPUTATION BELOW KNEE;  Surgeon: Sherren Kerns, MD;  Location: [facility] OR;  Service: Vascular;  Laterality: Right;  . Inguinal hernia repair Right ~ 2012   Family History  Problem Relation Age of Onset  . Family history unknown: Yes   Social History  Substance Use Topics  . Smoking status: Current Every Day Smoker -- 1.00 packs/day for 25 years    Types: Cigarettes  . Smokeless tobacco: Never Used  . Alcohol Use: 3.6 oz/week    0 Standard drinks or equivalent, 6 Cans of beer per week    Review of Systems  All other systems reviewed and are negative.     Allergies  Keflex and Lisinopril  Home Medications   Prior to Admission medications   Medication Sig Start Date End Date Taking? Authorizing Provider  DULoxetine (CYMBALTA) 60 MG capsule Take 60 mg by mouth daily.   Yes Historical Provider, MD  gabapentin (NEURONTIN) 300 MG capsule Take 300 mg by mouth 3 (three) times daily.   Yes Historical Provider, MD  LYRICA 75 MG capsule Take 75 mg by mouth 2 (two) times daily. 10/03/15  Yes Historical Provider, MD  nitroGLYCERIN (NITROSTAT) 0.4 MG SL tablet Place 0.4 mg under the tongue every 5 (five) minutes as needed for chest pain.    Yes Historical Provider, MD  rivaroxaban (XARELTO) 20 MG TABS tablet Take 20 mg by mouth daily with lunch.    Yes Historical Provider, MD   BP 129/86 mmHg  Pulse 128  Temp(Src) 101.5 F (38.6 C) (Oral)  Resp 24  SpO2 99% Physical Exam  Constitutional: No distress.  Thin, appears older than age  HENT:  Head: Normocephalic and atraumatic.  Right Ear: External ear normal.  Left Ear: External ear normal.  Eyes: Conjunctivae are normal. Right eye exhibits no discharge. Left eye exhibits no discharge. No scleral icterus.  Neck: Neck supple. No tracheal deviation present.  Cardiovascular: Normal rate, regular rhythm and  intact distal pulses.   Pulmonary/Chest: Effort normal and breath sounds normal. No stridor. No respiratory distress. He has no wheezes. He has no rales.  Abdominal: Soft. Bowel sounds are normal. He exhibits no distension. There is no tenderness. There is no rebound and no guarding.  Musculoskeletal: He exhibits edema and tenderness.  There is erythema at the surgical site, he is very tender to palpation. There is edema and increased warmth, no lymphangitic streaking  Neurological: He is alert. He has normal strength. No cranial nerve deficit (no facial droop, extraocular movements intact, no slurred speech) or sensory deficit. He exhibits normal muscle tone. He displays no seizure activity. Coordination normal.  Skin: Skin is warm and dry. No rash noted.  Psychiatric: He has a normal mood and affect.  Nursing note and  vitals reviewed.   ED Course  Procedures (including critical care time) Labs Review Labs Reviewed  CBC WITH DIFFERENTIAL/PLATELET - Abnormal; Notable for the following:    WBC 14.7 (*)    RBC 4.17 (*)    Neutro Abs 11.8 (*)    Monocytes Absolute 1.2 (*)    All other components within normal limits  COMPREHENSIVE METABOLIC PANEL - Abnormal; Notable for the following:    Sodium 133 (*)    Chloride 95 (*)    Glucose, Bld 110 (*)    Albumin 3.0 (*)    ALT 13 (*)    Alkaline Phosphatase 127 (*)    All other components within normal limits  CULTURE, BLOOD (ROUTINE X 2)  CULTURE, BLOOD (ROUTINE X 2)  URINE CULTURE  URINALYSIS, ROUTINE W REFLEX MICROSCOPIC (NOT AT [facility])  CBC  CREATININE, SERUM  I-STAT CG4 LACTIC ACID, ED    I have personally reviewed and evaluated these images and lab results as part of my medical decision-making.   EKG Interpretation   Date/Time:  Tuesday October 25 2015 12:27:33 EDT Ventricular Rate:  118 PR Interval:    QRS Duration: 81 QT Interval:  303 QTC Calculation: 425 R Axis:   107 Text Interpretation:  Sinus tachycardia Right axis  deviation Since last  tracing rate faster Confirmed by Saron Tweed  MD-J, Markies Mowatt ([id number]) on 10/25/2015  12:40:31 PM        MDM   Final diagnoses:  Cellulitis of right lower extremity    Patient called Dr. Darrick Pennafields prior to coming into the emergency room. Dr. Darrick Pennafields was contacted and plans on seeing the patient here. Currently he is in the operating room. SX are concerning for infection. He is also tachycardic but currently not hypotensive. I will start the patient on IV fluids, antibiotics will get laboratory tests and an x-ray.  Dr Darrick PennaFields saw the patient in the ED.  Admit for further treatment.   Linwood DibblesJon Darrah Dredge, MD 10/25/15 1242

## 2015-10-25 NOTE — Telephone Encounter (Signed)
rec'd phone call from Physical Therapist.  Reported "there has been a decline in condition since Sunday; generalized weakness, increased pain and sensitivity of incisional area; increased incisional drainage of blood with pus; fever; and black discoloration of the incisional area."  Notified Dr. Darrick PennaFields.  Recommended that pt. go to the ER right away.  Phone call to pt's mother.  Advised to [facility] right away.  Verb. Understanding.  Will make the ER Triage nurse aware that pt. Is coming, and to notify Dr. Darrick PennaFields when he arrives.

## 2015-10-25 NOTE — H&P (Signed)
Patient name: Edward [name] MRN: [id number] DOB: 1970-06-03 Sex: male  REASON FOR CONSULT: pain swelling right BKA  HPI: Edward [name] is a 45 y.o. male,  S/p Right BKA about a month ago.  He was in the hospital last week with some drainage and erythema in the stump.  This improved and he was sent home.  He now has worsening swelling and pain in the stump with some bloody drainage.  He is on xarelto for PE about 1 year ago.  He states he did have a fever yesterday.  Past Medical History  Diagnosis Date  . Pneumothorax 2006    s/p chest tube placement x 2   . Emphysema   . DVT (deep venous thrombosis) (HCC)     right leg-takes Xarelto daily bridging with Lovenox for surgery  . Coronary artery disease   . Peripheral vascular disease (HCC)   . Pulmonary embolism (HCC) 2016  . Emphysema lung (HCC)   . Shortness of breath dyspnea     with exertion  . Vertigo     rarely but doesn't take any meds  . Peripheral neuropathy (HCC)     takes Gabapentin daily  . History of gout     not on any meds  . Collagen vascular disease (HCC)   . Dizziness 07-16-2015  . Myocardial infarction (HCC) 2012  . Depression   . Arthritis     "hands" (10/17/2015)   Past Surgical History  Procedure Laterality Date  . Chest tube insertion    . Peripheral vascular catheterization N/A 05/18/2015    Procedure: Abdominal Aortogram;  Surgeon: Nada Libman, MD;  Location: [facility];  Service: Cardiovascular;  Laterality: N/A;  . Cardiac catheterization  2012    s/p stent placement  . Coronary angioplasty with stent placement      1 stent  . Femoral-popliteal bypass graft Right 05/30/2015    Procedure: RIGHT SUPERFICIAL FEMORAL TO BELOW KNEE POPLITEAL ARTERY BYPASS GRAFT;  Surgeon: Sherren Kerns, MD;  Location: [facility] OR;  Service: Vascular;  Laterality: Right;  . Vein harvest Right 05/30/2015    Procedure: RIGHT GREAT SAPHENOUS VEIN HARVEST;  Surgeon: Sherren Kerns, MD;  Location: [facility] OR;  Service:  Vascular;  Laterality: Right;  . Intraoperative arteriogram Right 05/30/2015    Procedure: INTRA OPERATIVE ARTERIOGRAM;  Surgeon: Sherren Kerns, MD;  Location: [facility] OR;  Service: Vascular;  Laterality: Right;  . Femoral-popliteal bypass graft Right 05/30/2015    Procedure: RIGHT BYPASS GRAFT FEMORAL-POPLITEAL ARTERY USING PROPATEN 27mmx80cm  GRAFT; WITH VEIN CUFF;  Surgeon: Sherren Kerns, MD;  Location: [facility] OR;  Service: Vascular;  Laterality: Right;  . Intraoperative arteriogram Right 05/30/2015    Procedure: INTRA OPERATIVE ARTERIOGRAM;  Surgeon: Sherren Kerns, MD;  Location: [facility] OR;  Service: Vascular;  Laterality: Right;  . Femoral-popliteal bypass graft Right 07/04/2015    Procedure: REDO RIGHT FEMORAL TO BELOW KNEE POPLITEAL BYPASS GRAFT USING CONTRALATERAL  REVERSED GREATER SAPHENOUS VEIN;  Surgeon: Sherren Kerns, MD;  Location: [facility] OR;  Service: Vascular;  Laterality: Right;  . Vein harvest Left 07/04/2015    Procedure: VEIN HARVEST LEFT GREATER SAPHENOUS ;  Surgeon: Sherren Kerns, MD;  Location: [facility] OR;  Service: Vascular;  Laterality: Left;  . Intraoperative arteriogram  07/04/2015    Procedure: INTRA OPERATIVE ARTERIOGRAM LEFT LOWER EXTREMITY;  Surgeon: Sherren Kerns, MD;  Location: [facility] OR;  Service: Vascular;;  . Thrombectomy femoral artery Left 07/04/2015  Procedure: THROMBECTOMY LEFT  TIBIAL ARTERY;  Surgeon: Sherren Kerns, MD;  Location: [facility] OR;  Service: Vascular;  Laterality: Left;  . Amputation Right 09/27/2015    Procedure: AMPUTATION BELOW KNEE;  Surgeon: Sherren Kerns, MD;  Location: [facility] OR;  Service: Vascular;  Laterality: Right;  . Inguinal hernia repair Right ~ 2012    Family History  Problem Relation Age of Onset  . Family history unknown: Yes    SOCIAL HISTORY: Social History   Social History  . Marital Status: Divorced    Spouse Name: N/A  . Number of Children: N/A  . Years of Education: N/A   Occupational History  . Not on file.   Social  History Main Topics  . Smoking status: Current Every Day Smoker -- 1.00 packs/day for 25 years    Types: Cigarettes  . Smokeless tobacco: Never Used  . Alcohol Use: 3.6 oz/week    0 Standard drinks or equivalent, 6 Cans of beer per week  . Drug Use: Yes    Special: Marijuana, Cocaine     Comment: 10/17/2015 "last drug use was in the 1990s"  . Sexual Activity: Not Currently   Other Topics Concern  . Not on file   Social History Narrative    Allergies  Allergen Reactions  . Keflex [Cephalexin] Nausea And Vomiting  . Lisinopril Other (See Comments)    HYPOTENSION    Current Facility-Administered Medications  Medication Dose Route Frequency Provider Last Rate Last Dose  . 0.9 %  sodium chloride infusion  250 mL Intravenous PRN Sherren Kerns, MD      . acetaminophen (TYLENOL) tablet 325-650 mg  325-650 mg Oral Q4H PRN Sherren Kerns, MD       Or  . acetaminophen (TYLENOL) suppository 325-650 mg  325-650 mg Rectal Q4H PRN Sherren Kerns, MD      . alum & mag hydroxide-simeth (MAALOX/MYLANTA) 200-200-20 MG/5ML suspension 15-30 mL  15-30 mL Oral Q2H PRN Sherren Kerns, MD      . docusate sodium (COLACE) capsule 100 mg  100 mg Oral BID Sherren Kerns, MD      . DULoxetine (CYMBALTA) DR capsule 60 mg  60 mg Oral Daily Sherren Kerns, MD      . gabapentin (NEURONTIN) capsule 300 mg  300 mg Oral TID Sherren Kerns, MD      . guaiFENesin-dextromethorphan (ROBITUSSIN DM) 100-10 MG/5ML syrup 15 mL  15 mL Oral Q4H PRN Sherren Kerns, MD      . heparin injection 5,000 Units  5,000 Units Subcutaneous Q8H Sherren Kerns, MD      . hydrALAZINE (APRESOLINE) injection 5 mg  5 mg Intravenous Q20 Min PRN Sherren Kerns, MD      . HYDROmorphone (DILAUDID) injection 1 mg  1 mg Intravenous Once Linwood Dibbles, MD      . labetalol (NORMODYNE,TRANDATE) injection 10 mg  10 mg Intravenous Q10 min PRN Sherren Kerns, MD      . metoprolol (LOPRESSOR) injection 2-5 mg  2-5 mg Intravenous Q2H PRN  Sherren Kerns, MD      . morphine 2 MG/ML injection 2-5 mg  2-5 mg Intravenous Q1H PRN Sherren Kerns, MD      . nitroGLYCERIN (NITROSTAT) SL tablet 0.4 mg  0.4 mg Sublingual Q5 min PRN Sherren Kerns, MD      . ondansetron [facility]) injection 4 mg  4 mg Intravenous Q6H PRN Sherren Kerns, MD      .  oxyCODONE-acetaminophen (PERCOCET/ROXICET) 5-325 MG per tablet 1-2 tablet  1-2 tablet Oral Q4H PRN Sherren Kernsharles E Dorreen Valiente, MD      . pantoprazole (PROTONIX) EC tablet 40 mg  40 mg Oral Daily Sherren Kernsharles E Cavion Faiola, MD      . phenol (CHLORASEPTIC) mouth spray 1 spray  1 spray Mouth/Throat PRN Sherren Kernsharles E Sohan Potvin, MD      . piperacillin-tazobactam (ZOSYN) IVPB 3.375 g  3.375 g Intravenous Once Rosaland LaoRachel L Rumbarger, RPH      . piperacillin-tazobactam (ZOSYN) IVPB 3.375 g  3.375 g Intravenous Q8H Sherren Kernsharles E Derik Fults, MD      . potassium chloride SA (K-DUR,KLOR-CON) CR tablet 20-40 mEq  20-40 mEq Oral Once Sherren Kernsharles E Keyston Ardolino, MD      . pregabalin (LYRICA) capsule 75 mg  75 mg Oral BID Sherren Kernsharles E Sanae Willetts, MD      . sodium chloride 0.9 % bolus 1,000 mL  1,000 mL Intravenous Once Linwood DibblesJon Knapp, MD       And  . sodium chloride 0.9 % bolus 500 mL  500 mL Intravenous Once Linwood DibblesJon Knapp, MD       And  . sodium chloride 0.9 % bolus 250 mL  250 mL Intravenous Once Linwood DibblesJon Knapp, MD      . sodium chloride flush (NS) 0.9 % injection 3 mL  3 mL Intravenous Q12H Sherren Kernsharles E Melanee Cordial, MD      . sodium chloride flush (NS) 0.9 % injection 3 mL  3 mL Intravenous PRN Sherren Kernsharles E Mazi Brailsford, MD      . vancomycin (VANCOCIN) IVPB 1000 mg/200 mL premix  1,000 mg Intravenous Once Linwood DibblesJon Knapp, MD       Current Outpatient Prescriptions  Medication Sig Dispense Refill  . DULoxetine (CYMBALTA) 60 MG capsule Take 60 mg by mouth daily.    Marland Kitchen. gabapentin (NEURONTIN) 300 MG capsule Take 300 mg by mouth 3 (three) times daily.    Marland Kitchen. LYRICA 75 MG capsule Take 75 mg by mouth 2 (two) times daily.    . nitroGLYCERIN (NITROSTAT) 0.4 MG SL tablet Place 0.4 mg under the tongue every  5 (five) minutes as needed for chest pain.     . rivaroxaban (XARELTO) 20 MG TABS tablet Take 20 mg by mouth daily with lunch.       ROS:   General:  No weight loss, + Fever, chills  HEENT: No recent headaches, no nasal bleeding, no visual changes, no sore throat  Neurologic: No dizziness, blackouts, seizures. No recent symptoms of stroke or mini- stroke. No recent episodes of slurred speech, or temporary blindness.  Cardiac: No recent episodes of chest pain/pressure, no shortness of breath at rest.  + shortness of breath with exertion.  Denies history of atrial fibrillation or irregular heartbeat  Vascular: No history of rest pain in feet.  No history of claudication.  No history of non-healing ulcer, + history of DVT   Pulmonary: No home oxygen, no productive cough, no hemoptysis,  No asthma or wheezing  Musculoskeletal:  [ ]  Arthritis, [ ]  Low back pain,  [ ]  Joint pain  Hematologic:No history of hypercoagulable state.  No history of easy bleeding.  No history of anemia  Gastrointestinal: No hematochezia or melena,  No gastroesophageal reflux, no trouble swallowing  Urinary: [ ]  chronic Kidney disease, [ ]  on HD - [ ]  MWF or [ ]  TTHS, [ ]  Burning with urination, [ ]  Frequent urination, [ ]  Difficulty urinating;   Skin: No rashes  Psychological: No history of  anxiety,  No history of depression   Physical Examination  Filed Vitals:   10/25/15 1049 10/25/15 1119 10/25/15 1223  BP: 144/89 129/86   Pulse: 136 128   Temp: 100 F (37.8 C)  101.5 F (38.6 C)  TempSrc: Oral  Oral  Resp: 20 24   SpO2: 98% 99%     There is no weight on file to calculate BMI.  General:  Alert and oriented, no acute distress Skin: No rash early ecchymosis right BKA yellowish color Musculoskeletal: diffuse circumferential edema right BKA tender to palpation, no real fluctuance, 5-10 degree contracture of knee, dark eschar along suture line, no real drainage  Neurologic: Upper and lower  extremity motor 5/5 and symmetric  DATA:  CBC    Component Value Date/Time   WBC 14.7* 10/25/2015 1106   WBC 6.7 06/22/2011 1506   RBC 4.17* 10/25/2015 1106   RBC 4.77 06/22/2011 1506   HGB 13.2 10/25/2015 1106   HGB 16.2 06/22/2011 1506   HCT 39.6 10/25/2015 1106   HCT 46.3 06/22/2011 1506   PLT 292 10/25/2015 1106   PLT 192 06/22/2011 1506   MCV 95.0 10/25/2015 1106   MCV 97 06/22/2011 1506   MCH 31.7 10/25/2015 1106   MCH 34.0 06/22/2011 1506   MCHC 33.3 10/25/2015 1106   MCHC 34.9 06/22/2011 1506   RDW 13.1 10/25/2015 1106   RDW 12.4 06/22/2011 1506   LYMPHSABS 1.6 10/25/2015 1106   MONOABS 1.2* 10/25/2015 1106   EOSABS 0.1 10/25/2015 1106   BASOSABS 0.0 10/25/2015 1106      ASSESSMENT:  Poorly healing right BKA ? Hematoma from xarelto versus infection   PLAN:  Admit.  IV antibiotics.  Plan for I and D right BKA tomorrow.  Npo p midnight.  Consent   Fabienne Bruns, MD [facility] [place] Office: [phone] Pager: [phone]

## 2015-10-25 NOTE — ED Notes (Signed)
Pt. Stated, my knee is I think infected again. Started 3 days. Hurting bad, really sensitive.

## 2015-10-25 NOTE — Progress Notes (Signed)
Pharmacy Antibiotic Note  Bernette RedbirdKevin Tammen is a 45 y.o. male admitted on 10/25/2015 with cellulitis.  Pharmacy has been consulted for vancomycin and zosyn dosing. Tmax is 100, WBC is elevated at 14.7 and SCr is WNL.  Plan: - Vanc 1gm IV x 1 then 750mg  IV Q12H - Zosyn 3.375gm IV Q8H (4 hr inf) - F/u renal fxn, C&S, clinical status and trough at SS     Temp (24hrs), Avg:100.8 F (38.2 C), Min:100 F (37.8 C), Max:101.5 F (38.6 C)   Recent Labs Lab 10/25/15 1106  WBC 14.7*  CREATININE 0.86    Estimated Creatinine Clearance: 87.5 mL/min (by C-G formula based on Cr of 0.86).    Allergies  Allergen Reactions  . Keflex [Cephalexin] Nausea And Vomiting  . Lisinopril Other (See Comments)    HYPOTENSION    Antimicrobials this admission: Vanc 7/11>> Zosyn 7/11>>  Dose adjustments this admission: N/A  Microbiology results: Pending  Thank you for allowing pharmacy to be a part of this patient's care.  Daimion Adamcik, Drake LeachRachel Lynn 10/25/2015 12:33 PM

## 2015-10-26 ENCOUNTER — Inpatient Hospital Stay (HOSPITAL_COMMUNITY): Payer: No Typology Code available for payment source | Admitting: Certified Registered"

## 2015-10-26 ENCOUNTER — Encounter (HOSPITAL_COMMUNITY)
Admission: EM | Disposition: A | Discharge status: Home / Self Care | Payer: Self-pay | Source: Home / Self Care | Attending: Vascular Surgery

## 2015-10-26 DIAGNOSIS — T8781 Dehiscence of amputation stump: Secondary | ICD-10-CM

## 2015-10-26 HISTORY — PX: I&D EXTREMITY: SHX5045

## 2015-10-26 LAB — CBC
HCT: 33.7 % — ABNORMAL LOW (ref 39.0–52.0)
Hemoglobin: 11.2 g/dL — ABNORMAL LOW (ref 13.0–17.0)
MCH: 31.5 pg (ref 26.0–34.0)
MCHC: 33.2 g/dL (ref 30.0–36.0)
MCV: 94.9 fL (ref 78.0–100.0)
Platelets: 255 10*3/uL (ref 150–400)
RBC: 3.55 MIL/uL — ABNORMAL LOW (ref 4.22–5.81)
RDW: 13.1 % (ref 11.5–15.5)
WBC: 9.9 10*3/uL (ref 4.0–10.5)

## 2015-10-26 LAB — SURGICAL PCR SCREEN
MRSA, PCR: NEGATIVE
Staphylococcus aureus: NEGATIVE

## 2015-10-26 LAB — PROTIME-INR
INR: 1.54 — ABNORMAL HIGH (ref 0.00–1.49)
Prothrombin Time: 18.5 seconds — ABNORMAL HIGH (ref 11.6–15.2)

## 2015-10-26 LAB — URINE CULTURE: Culture: NO GROWTH

## 2015-10-26 SURGERY — IRRIGATION AND DEBRIDEMENT EXTREMITY
Anesthesia: General | Site: Leg Upper | Laterality: Right

## 2015-10-26 MED ORDER — PHENYLEPHRINE HCL 10 MG/ML IJ SOLN
INTRAMUSCULAR | Status: DC | PRN
  Administered 2015-10-26: 80 ug via INTRAVENOUS
  Administered 2015-10-26: 160 ug via INTRAVENOUS
  Administered 2015-10-26: 80 ug via INTRAVENOUS
  Administered 2015-10-26: 120 ug via INTRAVENOUS
  Administered 2015-10-26: 40 ug via INTRAVENOUS
  Administered 2015-10-26: 80 ug via INTRAVENOUS

## 2015-10-26 MED ORDER — DEXAMETHASONE SODIUM PHOSPHATE 10 MG/ML IJ SOLN
INTRAMUSCULAR | Status: DC | PRN
  Administered 2015-10-26: 10 mg via INTRAVENOUS

## 2015-10-26 MED ORDER — ONDANSETRON HCL 4 MG/2ML IJ SOLN: 4.0000 mg | Freq: Four times a day (QID) | INTRAMUSCULAR | Status: DC | PRN

## 2015-10-26 MED ORDER — MORPHINE SULFATE 2 MG/ML IV SOLN
INTRAVENOUS | Status: DC
  Administered 2015-10-26: 19.5 mg via INTRAVENOUS
  Administered 2015-10-26 – 2015-10-27 (×2): via INTRAVENOUS
  Administered 2015-10-27: 21 mg via INTRAVENOUS
  Administered 2015-10-27: 24 mg via INTRAVENOUS
  Administered 2015-10-27: 19.5 mg via INTRAVENOUS
  Administered 2015-10-27 (×2): via INTRAVENOUS
  Administered 2015-10-27: 18 mg via INTRAVENOUS
  Administered 2015-10-27: 15 mg via INTRAVENOUS
  Administered 2015-10-27: 19.5 mg via INTRAVENOUS
  Administered 2015-10-27: 15 mg via INTRAVENOUS
  Administered 2015-10-28: 10:00:00 via INTRAVENOUS
  Administered 2015-10-28: 19.5 mg via INTRAVENOUS
  Administered 2015-10-28: 23.7 mg via INTRAVENOUS
  Administered 2015-10-28: 2 mg via INTRAVENOUS
  Administered 2015-10-28: 24 mg via INTRAVENOUS
  Administered 2015-10-29: 25.5 mg via INTRAVENOUS
  Administered 2015-10-29: 21 mg via INTRAVENOUS
  Administered 2015-10-29: 05:00:00 via INTRAVENOUS
  Administered 2015-10-29: 16.5 mg via INTRAVENOUS
  Administered 2015-10-29 (×2): via INTRAVENOUS
  Administered 2015-10-29: 25.5 mg via INTRAVENOUS
  Administered 2015-10-29: 10.5 mg via INTRAVENOUS
  Administered 2015-10-30: 12 mg via INTRAVENOUS
  Administered 2015-10-30: 1.5 mg via INTRAVENOUS
  Administered 2015-10-30: 15 mg via INTRAVENOUS
  Administered 2015-10-30: 24 mg via INTRAVENOUS
  Filled 2015-10-26 (×10): qty 25

## 2015-10-26 MED ORDER — FENTANYL CITRATE (PF) 100 MCG/2ML IJ SOLN
INTRAMUSCULAR | Status: DC | PRN
  Administered 2015-10-26 (×5): 50 ug via INTRAVENOUS

## 2015-10-26 MED ORDER — DIPHENHYDRAMINE HCL 12.5 MG/5ML PO ELIX: 12.5000 mg | ORAL_SOLUTION | Freq: Four times a day (QID) | ORAL | Status: DC | PRN

## 2015-10-26 MED ORDER — MIDAZOLAM HCL 2 MG/2ML IJ SOLN
INTRAMUSCULAR | Status: AC
  Filled 2015-10-26: qty 2

## 2015-10-26 MED ORDER — FENTANYL CITRATE (PF) 250 MCG/5ML IJ SOLN
INTRAMUSCULAR | Status: AC
  Filled 2015-10-26: qty 5

## 2015-10-26 MED ORDER — 0.9 % SODIUM CHLORIDE (POUR BTL) OPTIME
TOPICAL | Status: DC | PRN
  Administered 2015-10-26: 2000 mL

## 2015-10-26 MED ORDER — SODIUM CHLORIDE 0.9% FLUSH: 9.0000 mL | INTRAVENOUS | Status: DC | PRN

## 2015-10-26 MED ORDER — MIDAZOLAM HCL 5 MG/5ML IJ SOLN
INTRAMUSCULAR | Status: DC | PRN
  Administered 2015-10-26: 2 mg via INTRAVENOUS

## 2015-10-26 MED ORDER — ONDANSETRON HCL 4 MG/2ML IJ SOLN
INTRAMUSCULAR | Status: AC
  Filled 2015-10-26: qty 2

## 2015-10-26 MED ORDER — PHENYLEPHRINE 40 MCG/ML (10ML) SYRINGE FOR IV PUSH (FOR BLOOD PRESSURE SUPPORT)
PREFILLED_SYRINGE | INTRAVENOUS | Status: AC
  Filled 2015-10-26: qty 10

## 2015-10-26 MED ORDER — NALOXONE HCL 0.4 MG/ML IJ SOLN: 0.4000 mg | INTRAMUSCULAR | Status: DC | PRN

## 2015-10-26 MED ORDER — LACTATED RINGERS IV SOLN
INTRAVENOUS | Status: DC
Start: 2015-10-26 — End: 2015-10-26
  Administered 2015-10-26 (×2): via INTRAVENOUS

## 2015-10-26 MED ORDER — PROPOFOL 10 MG/ML IV BOLUS
INTRAVENOUS | Status: AC
  Filled 2015-10-26: qty 20

## 2015-10-26 MED ORDER — PROPOFOL 10 MG/ML IV BOLUS
INTRAVENOUS | Status: DC | PRN
  Administered 2015-10-26: 150 mg via INTRAVENOUS

## 2015-10-26 MED ORDER — DEXAMETHASONE SODIUM PHOSPHATE 10 MG/ML IJ SOLN
INTRAMUSCULAR | Status: AC
  Filled 2015-10-26: qty 1

## 2015-10-26 MED ORDER — ONDANSETRON HCL 4 MG/2ML IJ SOLN
INTRAMUSCULAR | Status: DC | PRN
  Administered 2015-10-26: 4 mg via INTRAVENOUS

## 2015-10-26 MED ORDER — DIPHENHYDRAMINE HCL 50 MG/ML IJ SOLN: 12.5000 mg | Freq: Four times a day (QID) | INTRAMUSCULAR | Status: DC | PRN

## 2015-10-26 MED ORDER — ESMOLOL HCL 100 MG/10ML IV SOLN
INTRAVENOUS | Status: DC | PRN
  Administered 2015-10-26: 20 mg via INTRAVENOUS
  Administered 2015-10-26: 30 mg via INTRAVENOUS
  Administered 2015-10-26 (×2): 20 mg via INTRAVENOUS

## 2015-10-26 MED ORDER — LIDOCAINE HCL (CARDIAC) 20 MG/ML IV SOLN
INTRAVENOUS | Status: DC | PRN
Start: 2015-10-26 — End: 2015-10-26
  Administered 2015-10-26: 100 mg via INTRAVENOUS

## 2015-10-26 MED ORDER — LIDOCAINE 2% (20 MG/ML) 5 ML SYRINGE
INTRAMUSCULAR | Status: AC
  Filled 2015-10-26: qty 5

## 2015-10-26 SURGICAL SUPPLY — 28 items
BANDAGE ELASTIC 4 VELCRO ST LF (GAUZE/BANDAGES/DRESSINGS) IMPLANT
BANDAGE ELASTIC 6 VELCRO ST LF (GAUZE/BANDAGES/DRESSINGS) IMPLANT
BNDG GAUZE ELAST 4 BULKY (GAUZE/BANDAGES/DRESSINGS) ×2 IMPLANT
CANISTER SUCTION 2500CC (MISCELLANEOUS) ×2 IMPLANT
COVER SURGICAL LIGHT HANDLE (MISCELLANEOUS) ×2 IMPLANT
DRSG PAD ABDOMINAL 8X10 ST (GAUZE/BANDAGES/DRESSINGS) ×2 IMPLANT
ELECT REM PT RETURN 9FT ADLT (ELECTROSURGICAL) ×2
ELECTRODE REM PT RTRN 9FT ADLT (ELECTROSURGICAL) ×1 IMPLANT
GAUZE SPONGE 4X4 12PLY STRL (GAUZE/BANDAGES/DRESSINGS) ×2 IMPLANT
GLOVE BIO SURGEON STRL SZ7.5 (GLOVE) ×2 IMPLANT
GOWN STRL REUS W/ TWL LRG LVL3 (GOWN DISPOSABLE) ×3 IMPLANT
GOWN STRL REUS W/TWL LRG LVL3 (GOWN DISPOSABLE) ×3
KIT BASIN OR (CUSTOM PROCEDURE TRAY) ×2 IMPLANT
KIT ROOM TURNOVER OR (KITS) ×2 IMPLANT
NS IRRIG 1000ML POUR BTL (IV SOLUTION) ×2 IMPLANT
PACK GENERAL/GYN (CUSTOM PROCEDURE TRAY) IMPLANT
PACK UNIVERSAL I (CUSTOM PROCEDURE TRAY) IMPLANT
PAD ARMBOARD 7.5X6 YLW CONV (MISCELLANEOUS) ×4 IMPLANT
SPONGE GAUZE 4X4 12PLY STER LF (GAUZE/BANDAGES/DRESSINGS) ×2 IMPLANT
STAPLER VISISTAT 35W (STAPLE) IMPLANT
SUT ETHILON 3 0 PS 1 (SUTURE) IMPLANT
SUT VIC AB 2-0 CTX 36 (SUTURE) IMPLANT
SUT VIC AB 3-0 SH 27 (SUTURE)
SUT VIC AB 3-0 SH 27X BRD (SUTURE) IMPLANT
SUT VICRYL 4-0 PS2 18IN ABS (SUTURE) IMPLANT
TAPE CLOTH SURG 4X10 WHT LF (GAUZE/BANDAGES/DRESSINGS) ×2 IMPLANT
TOWEL OR 17X26 10 PK STRL BLUE (TOWEL DISPOSABLE) ×2 IMPLANT
WATER STERILE IRR 1000ML POUR (IV SOLUTION) ×2 IMPLANT

## 2015-10-26 NOTE — Anesthesia Preprocedure Evaluation (Signed)
Anesthesia Evaluation   Patient awake    Reviewed: Allergy & Precautions, NPO status , Patient's Chart, lab work & pertinent test results  Airway Mallampati: II  TM Distance: >3 FB Neck ROM: Full    Dental   Pulmonary shortness of breath, COPD, Current Smoker,    breath sounds clear to auscultation       Cardiovascular + CAD, + Past MI and + Peripheral Vascular Disease   Rhythm:Regular Rate:Normal     Neuro/Psych    GI/Hepatic negative GI ROS, Neg liver ROS,   Endo/Other  negative endocrine ROS  Renal/GU negative Renal ROS     Musculoskeletal   Abdominal   Peds  Hematology   Anesthesia Other Findings   Reproductive/Obstetrics                             Anesthesia Physical  Anesthesia Plan  ASA: IV  Anesthesia Plan: General   Post-op Pain Management:    Induction: Intravenous  Airway Management Planned: LMA  Additional Equipment: None  Intra-op Plan:   Post-operative Plan: Extubation in OR  Informed Consent: I have reviewed the patients History and Physical, chart, labs and discussed the procedure including the risks, benefits and alternatives for the proposed anesthesia with the patient or authorized representative who has indicated his/her understanding and acceptance.   Dental advisory given  Plan Discussed with: CRNA  Anesthesia Plan Comments:         Anesthesia Quick Evaluation

## 2015-10-26 NOTE — Anesthesia Postprocedure Evaluation (Signed)
Anesthesia Post Note  Patient: Edward RedbirdKevin Yager  Procedure(s) Performed: Procedure(s) (LRB): IRRIGATION AND DEBRIDEMENT BELOW KNEE AMPUTATION (Right)  Patient location during evaluation: PACU Anesthesia Type: General Level of consciousness: sedated and patient cooperative Pain management: pain level controlled Vital Signs Assessment: post-procedure vital signs reviewed and stable Respiratory status: spontaneous breathing Cardiovascular status: stable Anesthetic complications: no    Last Vitals:  Filed Vitals:   10/26/15 1508 10/26/15 1640  BP:    Pulse: 113   Temp: 37.1 C   Resp: 15 21    Last Pain:  Filed Vitals:   10/26/15 1655  PainSc: 4                  Khalib Fendley [employer]

## 2015-10-26 NOTE — Op Note (Signed)
Procedure: Incision and drainage right below-knee amputation with debridement  Preoperative diagnosis: Poorly healing right below-knee amputation  Postoperative diagnosis: Same  Anesthesia: Gen.  Assistant: Natasha Bencehrista McClellan RNFA  Operative findings: #1 old blood with some purulent component of aerobic and anaerobic cultures taken #2 infarcted gastrocnemius muscle  Operative details: After obtaining informed consent, the patient second operation. The patient was placed supine position operating table. After induction of general anesthesia and endotracheal intubation, the patient's entire right lower extremity prepped and draped in usual sterile fashion. Next, I began debridement of the lateral and medial edges of the BKA there is a sinus tract extending from the lateral portion of the BKA all way across to the medial side. Upon entering this cavity there was some old blood as well as purulent-looking material. All of this was thoroughly irrigated with normal saline solution. Cultures were taken. I debrided some of the muscle which was purple in appearance and seemed to be infarcted. I tried to debride this back as much as possible to healthy tissue. The tract and cavity was then packed with normal saline moistened gauze. Dry sterile dressing was applied. The patient tolerated the procedure well and there were no complications. Instrument sponge and needle count was correct in the case. The patient was taken to recovery room in stable condition.  Fabienne Brunsharles Mir Fullilove, MD [facility] [place] Office: [phone] Pager: [phone]

## 2015-10-26 NOTE — Interval H&P Note (Signed)
History and Physical Interval Note:  10/26/2015 1:01 PM  Edward RedbirdKevin Messamore  has presented today for surgery, with the diagnosis of Poorly healing right below knee amputation T81.89XA  The various methods of treatment have been discussed with the patient and family. After consideration of risks, benefits and other options for treatment, the patient has consented to  Procedure(s): IRRIGATION AND DEBRIDEMENT BELOW KNEE AMPUTATION (Right) as a surgical intervention .  The patient's history has been reviewed, patient examined, no change in status, stable for surgery.  I have reviewed the patient's chart and labs.  Questions were answered to the patient's satisfaction.     Fabienne BrunsFields, Ziyanna Tolin

## 2015-10-26 NOTE — Progress Notes (Signed)
Initial Nutrition Assessment  DOCUMENTATION CODES:   Non-severe (moderate) malnutrition in context of chronic illness  INTERVENTION:   Advance diet as medically appropriate, add Boost Plus supplements once able  NUTRITION DIAGNOSIS:   Increased nutrient needs related to  (post op healing) as evidenced by estimated needs  GOAL:   Patient will meet greater than or equal to 90% of their needs  MONITOR:   PO intake, Supplement acceptance, Labs, Weight trends, I & O's  REASON FOR ASSESSMENT:   Malnutrition Screening Tool  ASSESSMENT:   45 yo Male who presented to the ER for evaluation of increasing pain in his right leg patient has a history of BKA of his right lower extremity. Patient has history of peripheral vascular disease. He had femoropopliteal bypass surgery in March. Patient ended up developing recurrent limb ischemia. He subsequently had a below the knee amputation in June of this year. Patient started having increasing pain and redness and he was readmitted to the hospital about a week or 2 ago. He was released on July 5. At that time the patient was started on antibiotics for possible infection in his symptoms improved. Patient states that about 3 days ago he started having increasing pain again. He's had increasing redness and some drainage. The wound is very tender to the touch. He is also noticed increasing swelling. He developed a fever up to 101 the other day.  Patient reports he wasn't eating very well PTA. Weight has been stable. Currently NPO for I & D of R BKA. States he drinks Boost supplements at home >> would like after his surgery.  Nutrition-Focused physical exam completed. Findings are mild to moderate fat depletion, mild to moderate muscle depletion, and no edema.   Diet Order:  Diet NPO time specified Except for: Sips with Meds  Skin:  Reviewed, no issues  Last BM:  N/A  Height:   Ht Readings from Last 1 Encounters:  10/25/15 5\' 6"  (1.676 m)     Weight:   Wt Readings from Last 1 Encounters:  10/25/15 113 lb 8.6 oz (51.5 kg)    Ideal Body Weight:  59 kg  BMI:  19.2 kg/m2 >>> adjusted for BKA  Estimated Nutritional Needs:   Kcal:  1600-1800  Protein:  80-90 gm  Fluid:  1.6-1.8 L  EDUCATION NEEDS:   No education needs identified at this time  Maureen ChattersKatie Dalen [place], RD, LDN Pager #: [phone] After-Hours Pager #: [phone]

## 2015-10-26 NOTE — Transfer of Care (Signed)
Immediate Anesthesia Transfer of Care Note  Patient: Edward RedbirdKevin [name]  Procedure(s) Performed: Procedure(s): IRRIGATION AND DEBRIDEMENT BELOW KNEE AMPUTATION (Right)  Patient Location: PACU  Anesthesia Type:General  Level of Consciousness:  sedated, patient cooperative and responds to stimulation  Airway & Oxygen Therapy:Patient Spontanous Breathing and Patient connected to face mask oxgen  Post-op Assessment:  Report given to PACU RN and Post -op Vital signs reviewed and stable  Post vital signs:  Reviewed and stable  Last Vitals:  Filed Vitals:   10/26/15 0151 10/26/15 0315  BP:  137/79  Pulse:  124  Temp: 37.2 C 37.3 C  Resp:  20    Complications: No apparent anesthesia complications

## 2015-10-26 NOTE — Anesthesia Procedure Notes (Signed)
Procedure Name: LMA Insertion Date/Time: 10/26/2015 2:07 PM Performed by: Army FossaPULLIAM, Victorhugo Preis DANE Pre-anesthesia Checklist: Patient identified, Emergency Drugs available, Suction available, Patient being monitored and Timeout performed Patient Re-evaluated:Patient Re-evaluated prior to inductionOxygen Delivery Method: Circle system utilized Preoxygenation: Pre-oxygenation with 100% oxygen Intubation Type: IV induction LMA: LMA inserted LMA Size: 4.0 Number of attempts: 1 Airway Equipment and Method: Patient positioned with wedge pillow Placement Confirmation: positive ETCO2,  CO2 detector and breath sounds checked- equal and bilateral Tube secured with: Tape Dental Injury: Teeth and Oropharynx as per pre-operative assessment

## 2015-10-26 NOTE — Progress Notes (Signed)
Utilization review completed.  

## 2015-10-26 NOTE — Care Management Note (Signed)
Case Management Note Donn PieriniKristi Justine Cossin RN, BSN Unit 2W-Case Manager [phone]  Patient Details  Name: Edward RedbirdKevin [name] MRN: [id number] Date of Birth: 05-05-1970  Subjective/Objective:  Pt admitted - Poorly healing right BKA ? Hematoma from xarelto versus infection PLAN: Admit. IV antibiotics. Plan for I and D right BKA on 7/12                 Action/Plan: PTA pt lived at home- [facility] - will need resumption orders on discharge- CM to follow  Expected Discharge Date:                  Expected Discharge Plan:  Home w Home Health Services  In-House Referral:     Discharge planning Services  CM Consult  Post Acute Care Choice:  Home Health, Resumption of Svcs/PTA Provider Choice offered to:  Patient  DME Arranged:    DME Agency:     HH Arranged:    HH Agency:  [facility]  Status of Service:  In process, will continue to follow  If discussed at Long Length of Stay Meetings, dates discussed:    Additional Comments:  Darrold SpanWebster, Kimball Appleby Hall, RN 10/26/2015, 11:02 AM

## 2015-10-26 NOTE — Clinical Documentation Improvement (Signed)
Vascular Surgery  Please further clarify procedure documentation in the medical record of "debridement."   Type - excisional, non-excisional  Other condition  Clinically Undetermined   Supporting Information: "I debrided some of the muscle which was purple in appearance and seemed to be infarcted. I tried to debride this back as much as possible to healthy tissue. "  Please exercise your independent, professional judgment when responding. A specific answer is not anticipated or expected.   Thank You, Lavonda JumboLawanda J Ramie Erman [employer] Information Management [place] [phone]

## 2015-10-26 NOTE — Progress Notes (Signed)
Pharmacy Antibiotic Note  Bernette RedbirdKevin Huebert is a 45 y.o. male admitted on 10/25/2015 with cellulitis.  Pharmacy has been consulted for vancomycin and zosyn dosing. Tmax is 100, WBC wnl, and SCr WNL.  Plan: - Vanc 1gm IV x 1 then 750mg  IV Q12H - Zosyn 3.375gm IV Q8H (4 hr inf) - F/u renal fxn, C&S, clinical status and trough at SS  Height: 5\' 6"  (167.6 cm) Weight: 113 lb 8.6 oz (51.5 kg) IBW/kg (Calculated) : 63.8  Temp (24hrs), Avg:99.5 F (37.5 C), Min:98.6 F (37 C), Max:101.5 F (38.6 C)   Recent Labs Lab 10/25/15 1106 10/25/15 1145 10/26/15 0403  WBC 14.7*  --  9.9  CREATININE 0.86  --   --   LATICACIDVEN  --  1.16  --     Estimated Creatinine Clearance: 81.5 mL/min (by C-G formula based on Cr of 0.86).    Allergies  Allergen Reactions  . Keflex [Cephalexin] Nausea And Vomiting  . Lisinopril Other (See Comments)    HYPOTENSION    Antimicrobials this admission: Vanc 7/11>> Zosyn 7/11>>  Dose adjustments this admission: N/A  Microbiology results: 7/11 blood cx: NGTD 7/11 urine cx: IP 7/11 MRSA PCR: negative  Thank you for allowing pharmacy to be a part of this patient's care.  Fredonia HighlandMichael Bitonti, PharmD PGY-1 Pharmacy Resident Pager: [phone]

## 2015-10-27 ENCOUNTER — Encounter (HOSPITAL_COMMUNITY): Payer: Self-pay | Admitting: Vascular Surgery

## 2015-10-27 ENCOUNTER — Encounter: Payer: PRIVATE HEALTH INSURANCE | Admitting: Vascular Surgery

## 2015-10-27 MED ORDER — ONDANSETRON HCL 4 MG/2ML IJ SOLN
INTRAMUSCULAR | Status: AC
  Filled 2015-10-27: qty 4

## 2015-10-27 MED ORDER — DEXAMETHASONE SODIUM PHOSPHATE 10 MG/ML IJ SOLN
INTRAMUSCULAR | Status: AC
  Filled 2015-10-27: qty 1

## 2015-10-27 MED ORDER — BOOST PLUS PO LIQD
237.0000 mL | Freq: Three times a day (TID) | ORAL | Status: DC
  Administered 2015-10-28: 237 mL via ORAL
  Filled 2015-10-27 (×20): qty 237

## 2015-10-27 NOTE — Progress Notes (Addendum)
  Progress Note    10/27/2015 7:55 AM 1 Day Post-Op    Filed Vitals:   10/27/15 0400 10/27/15 0432  BP:  124/81  Pulse:  99  Temp:  97.8 F (36.6 C)  Resp: 12 20    Physical Exam: Extremities:  bandaged   CBC    Component Value Date/Time   WBC 9.9 10/26/2015 0403   WBC 6.7 06/22/2011 1506   RBC 3.55* 10/26/2015 0403   RBC 4.77 06/22/2011 1506   HGB 11.2* 10/26/2015 0403   HGB 16.2 06/22/2011 1506   HCT 33.7* 10/26/2015 0403   HCT 46.3 06/22/2011 1506   PLT 255 10/26/2015 0403   PLT 192 06/22/2011 1506   MCV 94.9 10/26/2015 0403   MCV 97 06/22/2011 1506   MCH 31.5 10/26/2015 0403   MCH 34.0 06/22/2011 1506   MCHC 33.2 10/26/2015 0403   MCHC 34.9 06/22/2011 1506   RDW 13.1 10/26/2015 0403   RDW 12.4 06/22/2011 1506   LYMPHSABS 1.6 10/25/2015 1106   MONOABS 1.2* 10/25/2015 1106   EOSABS 0.1 10/25/2015 1106   BASOSABS 0.0 10/25/2015 1106    BMET    Component Value Date/Time   NA 133* 10/25/2015 1106   NA 143 06/22/2011 1506   K 4.2 10/25/2015 1106   K 3.9 06/22/2011 1506   CL 95* 10/25/2015 1106   CL 106 06/22/2011 1506   CO2 29 10/25/2015 1106   CO2 27 06/22/2011 1506   GLUCOSE 110* 10/25/2015 1106   GLUCOSE 84 06/22/2011 1506   BUN 10 10/25/2015 1106   BUN 16 06/22/2011 1506   CREATININE 0.86 10/25/2015 1106   CREATININE 1.11 06/22/2011 1506   CALCIUM 9.1 10/25/2015 1106   CALCIUM 9.1 06/22/2011 1506   GFRNONAA >60 10/25/2015 1106   GFRNONAA >60 06/22/2011 1506   GFRAA >60 10/25/2015 1106   GFRAA >60 06/22/2011 1506    INR    Component Value Date/Time   INR 1.54* 10/26/2015 0403     Intake/Output Summary (Last 24 hours) at 10/27/15 0755 Last data filed at 10/26/15 1800  Gross per 24 hour  Intake   1560 ml  Output    750 ml  Net    810 ml     Aerobic/anaerobic wound cx 10/26/15:  Specimen Description WOUND   Special Requests BKA RIGHT PT ON ZOSYN VANC   Gram Stain ABUNDANT WBC PRESENT, PREDOMINANTLY PMN  FEW GRAM NEGATIVE  RODS       Culture PENDING   Report Status PENDING        AFB smear is in process  Assessment:  45 y.o. 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 Olga CoasterFields-he discussed with the pt we will proceed with above knee amputation tomorrow.  Do not do any dressing changes today. -DVT prophylaxis:  SQ heparin -continue antibiotics. -npo after MN and consent.   Doreatha MassedSamantha Rhyne, PA-C [facility] [phone] 10/27/2015 7:55 AM   Agree with above.  Non viable muscle yesterday.  Option of AKA discussed with pt.  He wishes to proceed with AKA tomorrow.  Fabienne Brunsharles Fields, MD [facility] [place] Office: [phone] Pager: [phone]

## 2015-10-28 ENCOUNTER — Inpatient Hospital Stay (HOSPITAL_COMMUNITY): Payer: No Typology Code available for payment source | Admitting: Anesthesiology

## 2015-10-28 ENCOUNTER — Encounter (HOSPITAL_COMMUNITY)
Admission: EM | Disposition: A | Discharge status: Home / Self Care | Payer: Self-pay | Source: Home / Self Care | Attending: Vascular Surgery

## 2015-10-28 HISTORY — PX: AMPUTATION: SHX166

## 2015-10-28 LAB — BASIC METABOLIC PANEL
Anion gap: 9 (ref 5–15)
BUN: 8 mg/dL (ref 6–20)
CO2: 33 mmol/L — ABNORMAL HIGH (ref 22–32)
Calcium: 8.5 mg/dL — ABNORMAL LOW (ref 8.9–10.3)
Chloride: 96 mmol/L — ABNORMAL LOW (ref 101–111)
Creatinine, Ser: 0.64 mg/dL (ref 0.61–1.24)
GFR calc Af Amer: >60 mL/min (ref >60)
GFR calc non Af Amer: >60 mL/min (ref >60)
Glucose, Bld: 89 mg/dL (ref 65–99)
Potassium: 3.7 mmol/L (ref 3.5–5.1)
Sodium: 138 mmol/L (ref 135–145)

## 2015-10-28 LAB — CBC
HCT: 32.9 % — ABNORMAL LOW (ref 39.0–52.0)
Hemoglobin: 10.5 g/dL — ABNORMAL LOW (ref 13.0–17.0)
MCH: 31.2 pg (ref 26.0–34.0)
MCHC: 31.9 g/dL (ref 30.0–36.0)
MCV: 97.6 fL (ref 78.0–100.0)
Platelets: 263 10*3/uL (ref 150–400)
RBC: 3.37 MIL/uL — ABNORMAL LOW (ref 4.22–5.81)
RDW: 13 % (ref 11.5–15.5)
WBC: 7 10*3/uL (ref 4.0–10.5)

## 2015-10-28 SURGERY — AMPUTATION, ABOVE KNEE
Anesthesia: General | Site: Leg Upper | Laterality: Right

## 2015-10-28 MED ORDER — LIDOCAINE HCL (CARDIAC) 20 MG/ML IV SOLN
INTRAVENOUS | Status: DC | PRN
  Administered 2015-10-28: 100 mg via INTRAVENOUS

## 2015-10-28 MED ORDER — MORPHINE SULFATE (PF) 4 MG/ML IV SOLN
5.0000 mg | Freq: Once | INTRAVENOUS | Status: AC
  Administered 2015-10-28: 5 mg via INTRAVENOUS
  Filled 2015-10-28: qty 2

## 2015-10-28 MED ORDER — SODIUM CHLORIDE 0.9 % IV SOLN
INTRAVENOUS | Status: DC
  Administered 2015-10-28 – 2015-10-29 (×2): via INTRAVENOUS

## 2015-10-28 MED ORDER — HYDROMORPHONE HCL 1 MG/ML IJ SOLN
INTRAMUSCULAR | Status: DC | PRN
  Administered 2015-10-28 (×2): 0.5 mg via INTRAVENOUS

## 2015-10-28 MED ORDER — ONDANSETRON HCL 4 MG/2ML IJ SOLN
INTRAMUSCULAR | Status: DC | PRN
  Administered 2015-10-28: 4 mg via INTRAVENOUS

## 2015-10-28 MED ORDER — PROPOFOL 10 MG/ML IV BOLUS
INTRAVENOUS | Status: DC | PRN
  Administered 2015-10-28: 30 mg via INTRAVENOUS
  Administered 2015-10-28: 150 mg via INTRAVENOUS

## 2015-10-28 MED ORDER — OXYCODONE HCL 5 MG/5ML PO SOLN: 5.0000 mg | Freq: Once | ORAL | Status: DC | PRN

## 2015-10-28 MED ORDER — METOPROLOL TARTRATE 5 MG/5ML IV SOLN
INTRAVENOUS | Status: DC | PRN
  Administered 2015-10-28: .5 mg via INTRAVENOUS
  Administered 2015-10-28 (×2): 1 mg via INTRAVENOUS

## 2015-10-28 MED ORDER — OXYCODONE HCL 5 MG PO TABS: 5.0000 mg | ORAL_TABLET | Freq: Once | ORAL | Status: DC | PRN

## 2015-10-28 MED ORDER — METOPROLOL TARTRATE 5 MG/5ML IV SOLN
INTRAVENOUS | Status: AC
  Filled 2015-10-28: qty 5

## 2015-10-28 MED ORDER — MIDAZOLAM HCL 2 MG/2ML IJ SOLN
INTRAMUSCULAR | Status: AC
  Filled 2015-10-28: qty 2

## 2015-10-28 MED ORDER — 0.9 % SODIUM CHLORIDE (POUR BTL) OPTIME
TOPICAL | Status: DC | PRN
  Administered 2015-10-28: 1000 mL

## 2015-10-28 MED ORDER — KETAMINE HCL 100 MG/ML IJ SOLN
INTRAMUSCULAR | Status: DC | PRN
  Administered 2015-10-28 (×2): 50 mg via INTRAVENOUS

## 2015-10-28 MED ORDER — ROCURONIUM BROMIDE 50 MG/5ML IV SOLN
INTRAVENOUS | Status: AC
  Filled 2015-10-28: qty 1

## 2015-10-28 MED ORDER — HYDROMORPHONE HCL 1 MG/ML IJ SOLN
INTRAMUSCULAR | Status: AC
  Filled 2015-10-28: qty 1

## 2015-10-28 MED ORDER — HYDROMORPHONE HCL 1 MG/ML IJ SOLN
INTRAMUSCULAR | Status: AC
  Administered 2015-10-28: 0.5 mg via INTRAVENOUS
  Filled 2015-10-28: qty 1

## 2015-10-28 MED ORDER — LACTATED RINGERS IV SOLN
INTRAVENOUS | Status: DC | PRN
  Administered 2015-10-28 (×2): via INTRAVENOUS

## 2015-10-28 MED ORDER — POTASSIUM CHLORIDE CRYS ER 20 MEQ PO TBCR: 20.0000 meq | EXTENDED_RELEASE_TABLET | Freq: Every day | ORAL | Status: DC | PRN

## 2015-10-28 MED ORDER — HYDROMORPHONE HCL 1 MG/ML IJ SOLN
0.2500 mg | INTRAMUSCULAR | Status: DC | PRN
  Administered 2015-10-28 (×2): 0.5 mg via INTRAVENOUS

## 2015-10-28 MED ORDER — ONDANSETRON HCL 4 MG/2ML IJ SOLN
INTRAMUSCULAR | Status: AC
  Filled 2015-10-28: qty 2

## 2015-10-28 MED ORDER — BISACODYL 10 MG RE SUPP: 10.0000 mg | Freq: Every day | RECTAL | Status: DC | PRN

## 2015-10-28 MED ORDER — PROMETHAZINE HCL 25 MG/ML IJ SOLN
6.2500 mg | INTRAMUSCULAR | Status: DC | PRN
Start: 2015-10-28 — End: 2015-10-28

## 2015-10-28 MED ORDER — HEPARIN SODIUM (PORCINE) 5000 UNIT/ML IJ SOLN
5000.0000 [IU] | Freq: Three times a day (TID) | INTRAMUSCULAR | Status: DC
  Administered 2015-10-28 – 2015-10-31 (×7): 5000 [IU] via SUBCUTANEOUS
  Filled 2015-10-28 (×7): qty 1

## 2015-10-28 MED ORDER — SUCCINYLCHOLINE CHLORIDE 20 MG/ML IJ SOLN
INTRAMUSCULAR | Status: DC | PRN
  Administered 2015-10-28: 80 mg via INTRAVENOUS

## 2015-10-28 MED ORDER — SUCCINYLCHOLINE CHLORIDE 200 MG/10ML IV SOSY
PREFILLED_SYRINGE | INTRAVENOUS | Status: AC
  Filled 2015-10-28: qty 10

## 2015-10-28 MED ORDER — ROCURONIUM BROMIDE 100 MG/10ML IV SOLN
INTRAVENOUS | Status: DC | PRN
  Administered 2015-10-28: 10 mg via INTRAVENOUS

## 2015-10-28 MED ORDER — MIDAZOLAM HCL 5 MG/5ML IJ SOLN
INTRAMUSCULAR | Status: DC | PRN
  Administered 2015-10-28 (×2): 1 mg via INTRAVENOUS

## 2015-10-28 MED ORDER — LIDOCAINE 2% (20 MG/ML) 5 ML SYRINGE
INTRAMUSCULAR | Status: AC
  Filled 2015-10-28: qty 15

## 2015-10-28 MED ORDER — KETAMINE HCL 100 MG/ML IJ SOLN
INTRAMUSCULAR | Status: AC
  Filled 2015-10-28: qty 1

## 2015-10-28 MED ORDER — FENTANYL CITRATE (PF) 250 MCG/5ML IJ SOLN
INTRAMUSCULAR | Status: AC
  Filled 2015-10-28: qty 5

## 2015-10-28 MED ORDER — FENTANYL CITRATE (PF) 100 MCG/2ML IJ SOLN
INTRAMUSCULAR | Status: DC | PRN
  Administered 2015-10-28 (×3): 50 ug via INTRAVENOUS
  Administered 2015-10-28: 100 ug via INTRAVENOUS

## 2015-10-28 MED ORDER — PROPOFOL 10 MG/ML IV BOLUS
INTRAVENOUS | Status: AC
  Filled 2015-10-28: qty 40

## 2015-10-28 SURGICAL SUPPLY — 42 items
BANDAGE ACE 6X5 VEL STRL LF (GAUZE/BANDAGES/DRESSINGS) ×2 IMPLANT
BANDAGE ELASTIC 6 VELCRO ST LF (GAUZE/BANDAGES/DRESSINGS) ×2 IMPLANT
BLADE SAW RECIP 87.9 MT (BLADE) ×2 IMPLANT
BNDG COHESIVE 4X5 TAN STRL (GAUZE/BANDAGES/DRESSINGS) ×2 IMPLANT
BNDG COHESIVE 6X5 TAN STRL LF (GAUZE/BANDAGES/DRESSINGS) ×2 IMPLANT
BNDG GAUZE ELAST 4 BULKY (GAUZE/BANDAGES/DRESSINGS) ×2 IMPLANT
CANISTER SUCTION 2500CC (MISCELLANEOUS) ×2 IMPLANT
CLIP TI MEDIUM 6 (CLIP) IMPLANT
COVER SURGICAL LIGHT HANDLE (MISCELLANEOUS) ×2 IMPLANT
COVER TABLE BACK 60X90 (DRAPES) ×2 IMPLANT
DRAIN CHANNEL 19F RND (DRAIN) IMPLANT
DRAPE ORTHO SPLIT 77X108 STRL (DRAPES) ×2
DRAPE PROXIMA HALF (DRAPES) ×2 IMPLANT
DRAPE SURG ORHT 6 SPLT 77X108 (DRAPES) ×2 IMPLANT
DRSG ADAPTIC 3X8 NADH LF (GAUZE/BANDAGES/DRESSINGS) ×2 IMPLANT
ELECT REM PT RETURN 9FT ADLT (ELECTROSURGICAL) ×2
ELECTRODE REM PT RTRN 9FT ADLT (ELECTROSURGICAL) ×1 IMPLANT
EVACUATOR SILICONE 100CC (DRAIN) IMPLANT
GAUZE SPONGE 4X4 12PLY STRL (GAUZE/BANDAGES/DRESSINGS) ×2 IMPLANT
GLOVE BIO SURGEON STRL SZ7.5 (GLOVE) ×2 IMPLANT
GOWN STRL REUS W/ TWL LRG LVL3 (GOWN DISPOSABLE) ×3 IMPLANT
GOWN STRL REUS W/TWL LRG LVL3 (GOWN DISPOSABLE) ×3
KIT BASIN OR (CUSTOM PROCEDURE TRAY) ×2 IMPLANT
KIT ROOM TURNOVER OR (KITS) ×2 IMPLANT
NS IRRIG 1000ML POUR BTL (IV SOLUTION) ×2 IMPLANT
PACK GENERAL/GYN (CUSTOM PROCEDURE TRAY) ×2 IMPLANT
PAD ARMBOARD 7.5X6 YLW CONV (MISCELLANEOUS) ×4 IMPLANT
SPONGE GAUZE 4X4 12PLY STER LF (GAUZE/BANDAGES/DRESSINGS) ×2 IMPLANT
STAPLER VISISTAT 35W (STAPLE) ×2 IMPLANT
STOCKINETTE IMPERVIOUS LG (DRAPES) ×2 IMPLANT
SUT ETHILON 3 0 PS 1 (SUTURE) IMPLANT
SUT SILK 2 0 SH (SUTURE) IMPLANT
SUT SILK 2 0 SH CR/8 (SUTURE) ×2 IMPLANT
SUT SILK 2 0 TIES 10X30 (SUTURE) ×2 IMPLANT
SUT VIC AB 2-0 CT1 18 (SUTURE) ×4 IMPLANT
SUT VIC AB 2-0 SH 18 (SUTURE) IMPLANT
SUT VIC AB 3-0 SH 27 (SUTURE) ×1
SUT VIC AB 3-0 SH 27X BRD (SUTURE) ×1 IMPLANT
TOWEL OR 17X24 6PK STRL BLUE (TOWEL DISPOSABLE) ×2 IMPLANT
TOWEL OR 17X26 10 PK STRL BLUE (TOWEL DISPOSABLE) ×2 IMPLANT
UNDERPAD 30X30 INCONTINENT (UNDERPADS AND DIAPERS) ×2 IMPLANT
WATER STERILE IRR 1000ML POUR (IV SOLUTION) ×2 IMPLANT

## 2015-10-28 NOTE — Interval H&P Note (Signed)
History and Physical Interval Note:  10/28/2015 7:35 AM  Edward RedbirdKevin Lacour  has presented today for surgery, with the diagnosis of Poorly healing right below knee amputation T81.89XA  The various methods of treatment have been discussed with the patient and family. After consideration of risks, benefits and other options for treatment, the patient has consented to  Procedure(s): AMPUTATION ABOVE KNEE (Right) as a surgical intervention .  The patient's history has been reviewed, patient examined, no change in status, stable for surgery.  I have reviewed the patient's chart and labs.  Questions were answered to the patient's satisfaction.     Fabienne BrunsFields, Yarnell Kozloski

## 2015-10-28 NOTE — Transfer of Care (Signed)
Immediate Anesthesia Transfer of Care Note  Patient: Bernette RedbirdKevin Schoenbeck  Procedure(s) Performed: Procedure(s): AMPUTATION ABOVE KNEE (Right)  Patient Location: PACU  Anesthesia Type:General  Level of Consciousness: awake and patient cooperative  Airway & Oxygen Therapy: Patient Spontanous Breathing and Patient connected to nasal cannula oxygen  Post-op Assessment: Report given to RN, Post -op Vital signs reviewed and stable and Patient moving all extremities  Post vital signs: Reviewed and stable  Last Vitals:  Filed Vitals:   10/28/15 0529 10/28/15 0545  BP:  126/86  Pulse:  124  Temp:  36.9 C  Resp: 27 20    Last Pain:  Filed Vitals:   10/28/15 0546  PainSc: 10-Worst pain ever      Patients Stated Pain Goal: 2 (10/26/15 0141)  Complications: No apparent anesthesia complications

## 2015-10-28 NOTE — Op Note (Signed)
[facility] OPERATIVE NOTE   Procedure: Right above knee amputation  Preop: poorly healing bka Post op: same  Surgeon(s): Sherren Kernsharles E Ebunoluwa Gernert, MD  ASSISTANT: Natasha Bencehrista McClellan RNFA  Anesthesia: General  Specimens: Right leg  PROCEDURE DETAIL: After obtaining informed consent, the patient was taken to the operating room. The patient was placed in supine position the operating room table. After induction of general anesthesia and endotracheal intubation the patient's Foley catheter was placed. Next patient's entire right lower extremity was prepped and draped in usual sterile fashion. A circumferential incision was made on the right leg just above the knee. The incision was carried down into the sucutaneous tissues down to level of a previously occluded prosthetic bypass. This was ligated and divided between silk ties. Soft tissues were taken down as well as the muscle and fascia with cautery. The superficial femoral artery and vein were dissected free circumferentially clamped and divided. These were suture ligated proximally. Remainder of the soft tissues were taken down with cautery. The periosteum was raised on the femur approximately 5 cm above the skin edge. The femur was divided at this level. The leg was passed off the table as a specimen. Hemostasis was obtained. The wound was thoroughly irrigated with normal saline solution. The fascial edges were reapproximated using interrupted 2 0 Vicryl sutures. The subcutaneous tissues reapproximated using a running 3-0 Vicryl suture. The skin was closed staples. Patient tolerated procedure well and there were no complications. Instrument sponge and needle counts were correct at the end of the case. Patient was taken to recovery in stable condition.  Fabienne Brunsharles Angeleena Dueitt, MD [facility] [place] Office: [phone] Pager: [phone]

## 2015-10-28 NOTE — Progress Notes (Signed)
Report given to Kenasia Scheller rn as caregiver 

## 2015-10-28 NOTE — Anesthesia Preprocedure Evaluation (Addendum)
Anesthesia Evaluation  Patient identified by MRN, date of birth, ID band Patient awake    Reviewed: Allergy & Precautions, NPO status , Patient's Chart, lab work & pertinent test results  Airway Mallampati: II  TM Distance: >3 FB Neck ROM: Full    Dental  (+) Poor Dentition, Dental Advisory Given Large beard:   Pulmonary shortness of breath, COPD, Current Smoker,    breath sounds clear to auscultation       Cardiovascular + CAD, + Past MI and + Peripheral Vascular Disease   Rhythm:Regular Rate:Normal     Neuro/Psych    GI/Hepatic negative GI ROS, Neg liver ROS,   Endo/Other  negative endocrine ROS  Renal/GU negative Renal ROS     Musculoskeletal   Abdominal   Peds  Hematology   Anesthesia Other Findings Many decayed teeth but pt denies that any are loose.  Full beard.  Reproductive/Obstetrics                           Anesthesia Physical  Anesthesia Plan  ASA: III  Anesthesia Plan: General   Post-op Pain Management:    Induction: Intravenous  Airway Management Planned: LMA  Additional Equipment: None  Intra-op Plan:   Post-operative Plan: Extubation in OR  Informed Consent: I have reviewed the patients History and Physical, chart, labs and discussed the procedure including the risks, benefits and alternatives for the proposed anesthesia with the patient or authorized representative who has indicated his/her understanding and acceptance.   Dental advisory given  Plan Discussed with: CRNA  Anesthesia Plan Comments: (Recommend ketamine, acetaminophen and opioids for multimodal pain control)       Anesthesia Quick Evaluation

## 2015-10-28 NOTE — H&P (View-Only) (Signed)
  Progress Note    10/27/2015 7:55 AM 1 Day Post-Op    Filed Vitals:   10/27/15 0400 10/27/15 0432  BP:  124/81  Pulse:  99  Temp:  97.8 F (36.6 C)  Resp: 12 20    Physical Exam: Extremities:  bandaged   CBC    Component Value Date/Time   WBC 9.9 10/26/2015 0403   WBC 6.7 06/22/2011 1506   RBC 3.55* 10/26/2015 0403   RBC 4.77 06/22/2011 1506   HGB 11.2* 10/26/2015 0403   HGB 16.2 06/22/2011 1506   HCT 33.7* 10/26/2015 0403   HCT 46.3 06/22/2011 1506   PLT 255 10/26/2015 0403   PLT 192 06/22/2011 1506   MCV 94.9 10/26/2015 0403   MCV 97 06/22/2011 1506   MCH 31.5 10/26/2015 0403   MCH 34.0 06/22/2011 1506   MCHC 33.2 10/26/2015 0403   MCHC 34.9 06/22/2011 1506   RDW 13.1 10/26/2015 0403   RDW 12.4 06/22/2011 1506   LYMPHSABS 1.6 10/25/2015 1106   MONOABS 1.2* 10/25/2015 1106   EOSABS 0.1 10/25/2015 1106   BASOSABS 0.0 10/25/2015 1106    BMET    Component Value Date/Time   NA 133* 10/25/2015 1106   NA 143 06/22/2011 1506   K 4.2 10/25/2015 1106   K 3.9 06/22/2011 1506   CL 95* 10/25/2015 1106   CL 106 06/22/2011 1506   CO2 29 10/25/2015 1106   CO2 27 06/22/2011 1506   GLUCOSE 110* 10/25/2015 1106   GLUCOSE 84 06/22/2011 1506   BUN 10 10/25/2015 1106   BUN 16 06/22/2011 1506   CREATININE 0.86 10/25/2015 1106   CREATININE 1.11 06/22/2011 1506   CALCIUM 9.1 10/25/2015 1106   CALCIUM 9.1 06/22/2011 1506   GFRNONAA >60 10/25/2015 1106   GFRNONAA >60 06/22/2011 1506   GFRAA >60 10/25/2015 1106   GFRAA >60 06/22/2011 1506    INR    Component Value Date/Time   INR 1.54* 10/26/2015 0403     Intake/Output Summary (Last 24 hours) at 10/27/15 0755 Last data filed at 10/26/15 1800  Gross per 24 hour  Intake   1560 ml  Output    750 ml  Net    810 ml     Aerobic/anaerobic wound cx 10/26/15:  Specimen Description WOUND   Special Requests BKA RIGHT PT ON ZOSYN VANC   Gram Stain ABUNDANT WBC PRESENT, PREDOMINANTLY PMN  FEW GRAM NEGATIVE  RODS       Culture PENDING   Report Status PENDING        AFB smear is in process  Assessment:  44 y.o. 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 Fields-he discussed with the pt we will proceed with above knee amputation tomorrow.  Do not do any dressing changes today. -DVT prophylaxis:  SQ heparin -continue antibiotics. -npo after MN and consent.   Ryhanna Dunsmore, PA-C [facility] [phone] 10/27/2015 7:55 AM   Agree with above.  Non viable muscle yesterday.  Option of AKA discussed with pt.  He wishes to proceed with AKA tomorrow.  Charles Fields, MD [facility] [place] Office: [phone] Pager: [phone]  

## 2015-10-28 NOTE — Progress Notes (Signed)
Physical medicine rehabilitation consult requested. Patient with right AKA this morning 10/28/2015. Will await physical occupational therapy evaluations and follow-up Monday with appropriate recommendations and formal rehabilitation consult

## 2015-10-28 NOTE — Anesthesia Postprocedure Evaluation (Signed)
Anesthesia Post Note  Patient: Bernette RedbirdKevin Leoni  Procedure(s) Performed: Procedure(s) (LRB): AMPUTATION ABOVE KNEE (Right)  Patient location during evaluation: PACU Anesthesia Type: General Level of consciousness: awake and alert Pain management: pain level controlled Vital Signs Assessment: post-procedure vital signs reviewed and stable Respiratory status: spontaneous breathing, nonlabored ventilation, respiratory function stable and patient connected to nasal cannula oxygen Cardiovascular status: blood pressure returned to baseline and stable Postop Assessment: no signs of nausea or vomiting Anesthetic complications: no    Last Vitals:  Filed Vitals:   10/28/15 1017 10/28/15 1026  BP: 136/79   Pulse: 119   Temp: 36.7 C   Resp: 12 7    Last Pain:  Filed Vitals:   10/28/15 1031  PainSc: 10-Worst pain ever                 Reino KentJudd, Arieon Corcoran J

## 2015-10-28 NOTE — Anesthesia Procedure Notes (Signed)
Procedure Name: Intubation Date/Time: 10/28/2015 7:54 AM Performed by: Darcey NoraJAMES, Annarose Ouellet B Pre-anesthesia Checklist: Patient identified, Emergency Drugs available, Suction available and Patient being monitored Patient Re-evaluated:Patient Re-evaluated prior to inductionOxygen Delivery Method: Circle system utilized Preoxygenation: Pre-oxygenation with 100% oxygen Intubation Type: IV induction Ventilation: Mask ventilation without difficulty Laryngoscope Size: Mac and 3 Grade View: Grade II Tube type: Oral Tube size: 7.5 mm Number of attempts: 1 Airway Equipment and Method: Stylet Placement Confirmation: ETT inserted through vocal cords under direct vision,  breath sounds checked- equal and bilateral and positive ETCO2 Secured at: 23 (cm at teeth) cm Tube secured with: Tape Dental Injury: Teeth and Oropharynx as per pre-operative assessment

## 2015-10-28 NOTE — Progress Notes (Signed)
PT Cancellation Note  Patient Details Name: Edward [name] MRN: [id number] DOB: 11/27/1970   Cancelled Treatment:    Reason Eval/Treat Not Completed: Other (comment) (Surgery performed today.  Will evaluate for PT tomorrow.)   Donnella ShamSawulski, Salvatore Shear J 10/28/2015, 12:05 PM Lavona MoundMark Kohlton Gilpatrick, PT  [phone] 10/28/2015

## 2015-10-29 LAB — CBC
HCT: 34.5 % — ABNORMAL LOW (ref 39.0–52.0)
Hemoglobin: 10.7 g/dL — ABNORMAL LOW (ref 13.0–17.0)
MCH: 30.8 pg (ref 26.0–34.0)
MCHC: 31 g/dL (ref 30.0–36.0)
MCV: 99.4 fL (ref 78.0–100.0)
Platelets: 274 10*3/uL (ref 150–400)
RBC: 3.47 MIL/uL — ABNORMAL LOW (ref 4.22–5.81)
RDW: 13 % (ref 11.5–15.5)
WBC: 5.9 10*3/uL (ref 4.0–10.5)

## 2015-10-29 LAB — BASIC METABOLIC PANEL
Anion gap: 9 (ref 5–15)
BUN: 6 mg/dL (ref 6–20)
CO2: 37 mmol/L — ABNORMAL HIGH (ref 22–32)
Calcium: 8.4 mg/dL — ABNORMAL LOW (ref 8.9–10.3)
Chloride: 96 mmol/L — ABNORMAL LOW (ref 101–111)
Creatinine, Ser: 0.66 mg/dL (ref 0.61–1.24)
GFR calc Af Amer: >60 mL/min (ref >60)
GFR calc non Af Amer: >60 mL/min (ref >60)
Glucose, Bld: 108 mg/dL — ABNORMAL HIGH (ref 65–99)
Potassium: 3.8 mmol/L (ref 3.5–5.1)
Sodium: 142 mmol/L (ref 135–145)

## 2015-10-29 NOTE — Evaluation (Signed)
Physical Therapy Evaluation Patient Details Name: Edward [name] MRN: [id number] DOB: 24-Feb-1971 Today's Date: 10/29/2015   History of Present Illness  45 y.o. 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 Adm with some drainage, swelling, erythema in the stump. s/p I&D 7/12 with hematoma, infarcted gastroc, and purulent drainage. 7/14 to OR for Rt AKA  Clinical Impression  Patient is s/p above surgery resulting in functional limitations due to the deficits listed below (see PT Problem List). Patient lethargic (due to meds) however moving at [facility] assist level (bed to chair). Patient states his insurance denied rehab stay on last admission and he went home with HHPT. Denies falls. Patient will benefit from skilled PT to increase their independence and safety with mobility to allow discharge to the venue listed below.       Follow Up Recommendations Home health PT;Supervision for mobility/OOB    Equipment Recommendations  None recommended by PT    Recommendations for Other Services OT consult     Precautions / Restrictions Precautions Precautions: Fall      Mobility  Bed Mobility Overal bed mobility: Modified Independent             General bed mobility comments: incr time due to pain  Transfers Overall transfer level: Needs assistance Equipment used: None Transfers: Stand Pivot Transfers   Stand pivot transfers: Min guard       General transfer comment: minguard due to lethargy; moving slowly but steady  Ambulation/Gait             General Gait Details: deferred with order OOB to chair POD 1; pt lethargy  Stairs            Wheelchair Mobility    Modified Rankin (Stroke Patients Only)       Balance Overall balance assessment: Needs assistance Sitting-balance support: No upper extremity supported;Feet supported Sitting balance-Leahy Scale: Fair     Standing balance support: Single extremity supported Standing balance-Leahy Scale: Poor                                Pertinent Vitals/Pain Pain Assessment: 0-10 Pain Score: 10-Worst pain ever Pain Location: Rt leg Pain Descriptors / Indicators: Constant;Operative site guarding Pain Intervention(s): Limited activity within patient's tolerance;Monitored during session;Premedicated before session;PCA encouraged;Repositioned    Home Living Family/patient expects to be discharged to:: Private residence Living Arrangements: Parent;Non-relatives/Friends Available Help at Discharge: Family;Available 24 hours/day (mom, sister, friend) Type of Home: Mobile home Home Access: Stairs to enter Entrance Stairs-Rails: Can reach both;Left;Right Entrance Stairs-Number of Steps: 3 Home Layout: One level Home Equipment: Walker - 2 wheels;Crutches;Shower seat;Adaptive equipment;Wheelchair - manual (w/c cushion) 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 mother lives next door     Prior Function Level of Independence: Needs assistance   Gait / Transfers Assistance Needed: modified independent with RW; two person assist on steps (?uses RW forwards, per pt)           Hand Dominance   Dominant Hand: Right    Extremity/Trunk Assessment   Upper Extremity Assessment: Overall WFL for tasks assessed           Lower Extremity Assessment:  (Rt AKA; LLE WFL)      Cervical / Trunk Assessment: Normal  Communication   Communication: No difficulties  Cognition Arousal/Alertness: Lethargic;Suspect due to medications Behavior During Therapy: Flat affect Overall Cognitive Status: Within Functional Limits for tasks assessed  General Comments      Exercises Other Exercises Other Exercises: Educated OK to use pillow for elevation and edema control (especially in light of hematoma post-BKA), however need to remove pillow and lower HOB to achieve hip extension and rationale for this. 2X/day at least      Assessment/Plan    PT  Assessment Patient needs continued PT services  PT Diagnosis Acute pain;Difficulty walking   PT Problem List Decreased activity tolerance;Decreased balance;Decreased mobility;Decreased knowledge of use of DME;Pain;Decreased skin integrity  PT Treatment Interventions DME instruction;Gait training;Stair training;Functional mobility training;Therapeutic activities;Therapeutic exercise;Balance training;Patient/family education;Wheelchair mobility training   PT Goals (Current goals can be found in the Care Plan section) Acute Rehab PT Goals Patient Stated Goal: to go home with help PT Goal Formulation: With patient Time For Goal Achievement: 11/05/15 Potential to Achieve Goals: Good    Frequency Min 5X/week   Barriers to discharge        Co-evaluation               End of Session   Activity Tolerance: Patient limited by pain;Patient limited by lethargy Patient left: in chair;with call bell/phone within reach;with family/visitor present;Other (comment) (PCA; alarm pad under pt, no alarm box available) Nurse Communication: Mobility status;Other (comment) (no alarm box; mother in room)         Time: [phone] PT Time Calculation (min) (ACUTE ONLY): 21 min   Charges:   PT Evaluation $PT Eval Low Complexity: 1 Procedure     PT G Codes:        Aadya Kindler 10/29/2015, 12:05 PM Pager [phone]

## 2015-10-29 NOTE — Progress Notes (Addendum)
[facility] Progress Note  Subjective  - POD #1  Having pain right AKA.   Objective Filed Vitals:   10/29/15 0500 10/29/15 0800  BP: 119/70   Pulse: 118   Temp: 97.6 F (36.4 C)   Resp: 16 18   No intake or output data in the 24 hours ending 10/29/15 0936  Right AKA dressing dry.   Assessment/Planning: 45 y.o. male is s/p: right AKA for nonhealing BKA. 1 Day Post-Op   Will take down dressing tomorrow Tachycardia likely secondary to pain.  Patient is annoyed with constant beeping associated with PCA. Discussed that he may be better off with discontinuing PCA and switching to oral pain medications. Will leave decision up to patient.  CIR to evaluate.   Raymond Gurney 10/29/2015 9:36 AM --  Laboratory CBC    Component Value Date/Time   WBC 5.9 10/29/2015 0602   WBC 6.7 06/22/2011 1506   HGB 10.7* 10/29/2015 0602   HGB 16.2 06/22/2011 1506   HCT 34.5* 10/29/2015 0602   HCT 46.3 06/22/2011 1506   PLT 274 10/29/2015 0602   PLT 192 06/22/2011 1506    BMET    Component Value Date/Time   NA 142 10/29/2015 0602   NA 143 06/22/2011 1506   K 3.8 10/29/2015 0602   K 3.9 06/22/2011 1506   CL 96* 10/29/2015 0602   CL 106 06/22/2011 1506   CO2 37* 10/29/2015 0602   CO2 27 06/22/2011 1506   GLUCOSE 108* 10/29/2015 0602   GLUCOSE 84 06/22/2011 1506   BUN 6 10/29/2015 0602   BUN 16 06/22/2011 1506   CREATININE 0.66 10/29/2015 0602   CREATININE 1.11 06/22/2011 1506   CALCIUM 8.4* 10/29/2015 0602   CALCIUM 9.1 06/22/2011 1506   GFRNONAA >60 10/29/2015 0602   GFRNONAA >60 06/22/2011 1506   GFRAA >60 10/29/2015 0602   GFRAA >60 06/22/2011 1506    COAG Lab Results  Component Value Date   INR 1.54* 10/26/2015   INR 1.04 09/27/2015   INR 1.42 06/30/2015   No results found for: PTT  Antibiotics Anti-infectives    Start     Dose/Rate Route Frequency Ordered Stop   10/26/15 0100  vancomycin (VANCOCIN) IVPB 750 mg/150 ml premix     750  mg 150 mL/hr over 60 Minutes Intravenous Every 12 hours 10/25/15 1232     10/25/15 1830  piperacillin-tazobactam (ZOSYN) IVPB 3.375 g     3.375 g 12.5 mL/hr over 240 Minutes Intravenous Every 8 hours 10/25/15 1232     10/25/15 1400  piperacillin-tazobactam (ZOSYN) IVPB 3.375 g  Status:  Discontinued     3.375 g 12.5 mL/hr over 240 Minutes Intravenous Every 8 hours 10/25/15 1229 10/25/15 1232   10/25/15 1215  piperacillin-tazobactam (ZOSYN) IVPB 3.375 g     3.375 g 100 mL/hr over 30 Minutes Intravenous  Once 10/25/15 1210 10/25/15 1329   10/25/15 1145  aztreonam (AZACTAM) 2 g in dextrose 5 % 50 mL IVPB  Status:  Discontinued     2 g 100 mL/hr over 30 Minutes Intravenous  Once 10/25/15 1138 10/25/15 1205   10/25/15 1145  metroNIDAZOLE (FLAGYL) IVPB 500 mg  Status:  Discontinued     500 mg 100 mL/hr over 60 Minutes Intravenous  Once 10/25/15 1138 10/25/15 1205   10/25/15 1145  vancomycin (VANCOCIN) IVPB 1000 mg/200 mL premix     1,000 mg 200 mL/hr over 60 Minutes Intravenous  Once 10/25/15 1138 10/25/15 1427  Maris BergerKimberly Trinh, PA-C [facility] Office: [phone] Pager: [phone] 10/29/2015 9:36 AM  Addendum  I have independently interviewed and examined the patient, and I agree with the physician assistant's findings.  PCA off tomorrow or today if pt willing.  Dressing off tomorrow  Leonides SakeBrian Tabb Croghan, MD [facility] [place] Office: [phone] Pager: [phone]  10/29/2015, 9:57 AM

## 2015-10-30 LAB — BASIC METABOLIC PANEL
Anion gap: 5 (ref 5–15)
BUN: 6 mg/dL (ref 6–20)
CO2: 37 mmol/L — ABNORMAL HIGH (ref 22–32)
Calcium: 8.6 mg/dL — ABNORMAL LOW (ref 8.9–10.3)
Chloride: 94 mmol/L — ABNORMAL LOW (ref 101–111)
Creatinine, Ser: 0.61 mg/dL (ref 0.61–1.24)
GFR calc Af Amer: >60 mL/min (ref >60)
GFR calc non Af Amer: >60 mL/min (ref >60)
Glucose, Bld: 92 mg/dL (ref 65–99)
Potassium: 4.4 mmol/L (ref 3.5–5.1)
Sodium: 136 mmol/L (ref 135–145)

## 2015-10-30 LAB — CULTURE, BLOOD (ROUTINE X 2)
Culture: NO GROWTH
Culture: NO GROWTH

## 2015-10-30 LAB — CBC
HCT: 34.9 % — ABNORMAL LOW (ref 39.0–52.0)
Hemoglobin: 10.9 g/dL — ABNORMAL LOW (ref 13.0–17.0)
MCH: 30.5 pg (ref 26.0–34.0)
MCHC: 31.2 g/dL (ref 30.0–36.0)
MCV: 97.8 fL (ref 78.0–100.0)
Platelets: 314 10*3/uL (ref 150–400)
RBC: 3.57 MIL/uL — ABNORMAL LOW (ref 4.22–5.81)
RDW: 12.7 % (ref 11.5–15.5)
WBC: 6.5 10*3/uL (ref 4.0–10.5)

## 2015-10-30 MED ORDER — OXYCODONE-ACETAMINOPHEN 5-325 MG PO TABS
1.0000 | ORAL_TABLET | Freq: Four times a day (QID) | ORAL | Status: DC | PRN
  Administered 2015-10-30 – 2015-10-31 (×5): 2 via ORAL
  Filled 2015-10-30 (×4): qty 2

## 2015-10-30 MED ORDER — HYDROMORPHONE HCL 1 MG/ML IJ SOLN
1.0000 mg | INTRAMUSCULAR | Status: DC | PRN
  Administered 2015-10-30 – 2015-10-31 (×5): 1 mg via INTRAVENOUS
  Filled 2015-10-30 (×5): qty 1

## 2015-10-30 NOTE — Progress Notes (Signed)
   Daily Progress Note  Assessment/Planning: POD #2 s/p R AKA   Pain control still marginal.  Problems with PCA overnight  Switch to PO rx with IV breakthrough  Home with home PT after pain better controlled  Subjective  - 2 Days Post-Op  C/o continued pain, problems with PCA malfunctioning overnight  Objective Filed Vitals:   10/30/15 0400 10/30/15 0405 10/30/15 0444 10/30/15 0800  BP:  122/80    Pulse:  108    Temp:  98.8 F (37.1 C)    TempSrc:  Oral    Resp: 16 16 16 16   Height:      Weight:      SpO2: 97% 98% 98% 96%    Intake/Output Summary (Last 24 hours) at 10/30/15 0913 Last data filed at 10/30/15 0033  Gross per 24 hour  Intake    240 ml  Output    520 ml  Net   -280 ml    PULM  CTAB CV  RRR GI  soft, NTND VASC  R AKA viable, inc c/d/i  Laboratory CBC    Component Value Date/Time   WBC 6.5 10/30/2015 0238   WBC 6.7 06/22/2011 1506   HGB 10.9* 10/30/2015 0238   HGB 16.2 06/22/2011 1506   HCT 34.9* 10/30/2015 0238   HCT 46.3 06/22/2011 1506   PLT 314 10/30/2015 0238   PLT 192 06/22/2011 1506    BMET    Component Value Date/Time   NA 136 10/30/2015 0238   NA 143 06/22/2011 1506   K 4.4 10/30/2015 0238   K 3.9 06/22/2011 1506   CL 94* 10/30/2015 0238   CL 106 06/22/2011 1506   CO2 37* 10/30/2015 0238   CO2 27 06/22/2011 1506   GLUCOSE 92 10/30/2015 0238   GLUCOSE 84 06/22/2011 1506   BUN 6 10/30/2015 0238   BUN 16 06/22/2011 1506   CREATININE 0.61 10/30/2015 0238   CREATININE 1.11 06/22/2011 1506   CALCIUM 8.6* 10/30/2015 0238   CALCIUM 9.1 06/22/2011 1506   GFRNONAA >60 10/30/2015 0238   GFRNONAA >60 06/22/2011 1506   GFRAA >60 10/30/2015 0238   GFRAA >60 06/22/2011 1506    Leonides SakeBrian Hatsumi Steinhart, MD [facility] [place] Office: [phone] Pager: [phone]  10/30/2015, 9:13 AM

## 2015-10-30 NOTE — Progress Notes (Signed)
OT Cancellation Note  Patient Details Name: Edward RedbirdKevin [name] MRN: [id number] DOB: Oct 26, 1970   Cancelled Treatment:    Reason Eval/Treat Not Completed: Patient declined, no reason specified. Pt refusing to work with OT at this time despite max verbal encouragement; no reason stated. Will follow up for OT eval as time allows.  Gaye AlkenBailey A Manami Tutor M.S., OTR/L Pager: [phone]  10/30/2015, 4:45 PM

## 2015-10-31 ENCOUNTER — Encounter (HOSPITAL_COMMUNITY): Payer: Self-pay | Admitting: Vascular Surgery

## 2015-10-31 ENCOUNTER — Telehealth: Payer: Self-pay | Admitting: Vascular Surgery

## 2015-10-31 DIAGNOSIS — L03115 Cellulitis of right lower limb: Secondary | ICD-10-CM | POA: Insufficient documentation

## 2015-10-31 DIAGNOSIS — I739 Peripheral vascular disease, unspecified: Secondary | ICD-10-CM | POA: Insufficient documentation

## 2015-10-31 DIAGNOSIS — M79604 Pain in right leg: Secondary | ICD-10-CM | POA: Insufficient documentation

## 2015-10-31 DIAGNOSIS — Z515 Encounter for palliative care: Secondary | ICD-10-CM | POA: Insufficient documentation

## 2015-10-31 LAB — AEROBIC/ANAEROBIC CULTURE W GRAM STAIN (SURGICAL/DEEP WOUND)

## 2015-10-31 MED ORDER — RIVAROXABAN 20 MG PO TABS
20.0000 mg | ORAL_TABLET | Freq: Every day | ORAL | Status: DC
  Administered 2015-10-31: 20 mg via ORAL
  Filled 2015-10-31 (×2): qty 1

## 2015-10-31 MED ORDER — OXYCODONE HCL 5 MG PO TABS
10.0000 mg | ORAL_TABLET | ORAL | Status: DC | PRN
Start: 2015-10-31 — End: 2015-11-01
  Administered 2015-10-31 – 2015-11-01 (×7): 10 mg via ORAL
  Filled 2015-10-31 (×7): qty 2

## 2015-10-31 MED ORDER — HYDROMORPHONE HCL 1 MG/ML IJ SOLN: 1.0000 mg | INTRAMUSCULAR | Status: DC | PRN

## 2015-10-31 NOTE — Progress Notes (Signed)
Orthopedic Tech Progress Note Patient Details:  Bernette RedbirdKevin Locicero 1970/08/05 [id number]  Patient ID: Bernette RedbirdKevin Few, male   DOB: 1970/08/05, 45 y.o.   MRN: [id number] Called in bio-tech brace order; spoke with Anderson MaltaStephanie  Shawnique Mariotti 10/31/2015, 10:21 AM

## 2015-10-31 NOTE — Progress Notes (Signed)
[name] is a 45 y.o. 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 He was admitted on 10/25/15 with pain and swelling of R-BKA with concerns of hematoma v/s infection. He was started on IV antibiotics and on 07/12 underwent R-BKA I & D with cultures and was found to have infarcted gastrocnemius. He was taken OR on 07/14 for conversion to R-AKA. Pain control remains and issue requiring IV narcotics. Therapy evaluation done this weekend and HHPT recommended.  Patient seen and reports that he was independent with walker PTA--biggest complaint currently regarding pain management. He was standing in the room with walker and was able to take 2 pivoting steps to bed without assistance. Mother reports that patient is at baseline concur with HHPT for follow up after discharge. Will defer CIR consult.

## 2015-10-31 NOTE — Telephone Encounter (Signed)
-----   Message from Sharee PimpleMarilyn K McChesney, RN sent at 10/28/2015 12:12 PM EDT ----- Regarding: 4 weeks postop BKA, at least 4 please thanks   ----- Message -----    From: Dara LordsSamantha J Rhyne, PA-C    Sent: 10/28/2015   8:18 AM      To: [facility] Charge Pool  S/p right AKA.  F/u with Dr. Darrick PennaFields in 4 weeks.  Thanks, Lelon MastSamantha

## 2015-10-31 NOTE — Progress Notes (Signed)
Physical Therapy Treatment Patient Details Name: Edward RedbirdKevin [name] MRN: [id number] DOB: November 23, 1970 Today's Date: 10/31/2015    History of Present Illness 45 y.o. 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 Adm with some drainage, swelling, erythema in the stump. s/p I&D 7/12 with hematoma, infarcted gastroc, and purulent drainage. 7/14 to OR for Rt AKA    PT Comments    Pt admitted with above diagnosis. Pt currently with functional limitations due to balance and endurance deficits. Pt refused to practice steps today.  Pt and mom were educated regarding exercise program for home and given handout.  Mom wants HH services to continue at home at least initially.  Pt self limiting today.  Pt will benefit from skilled PT to increase their independence and safety with mobility to allow discharge to the venue listed below.    Follow Up Recommendations  Home health PT;Supervision for mobility/OOB     Equipment Recommendations  Other (comment) (pressure relieving cushion )    Recommendations for Other Services OT consult     Precautions / Restrictions Precautions Precautions: Fall Restrictions Weight Bearing Restrictions: Yes RLE Weight Bearing: Non weight bearing    Mobility  Bed Mobility Overal bed mobility: Modified Independent             General bed mobility comments: HOB elevated, increased time and effort to relax R residual limb to achieve neutral hip position.  Transfers Overall transfer level: Needs assistance Equipment used: Rolling walker (2 wheeled) Transfers: Sit to/from Stand Sit to Stand: Supervision         General transfer comment: Supervision for safety. No physical assist required, no LOB observed or reports of dizziness upon standing. Pt with good demonstration of safe hand placement, proper RW use, and overall safe strategies when mobilizing.  Ambulation/Gait                 Stairs            Wheelchair Mobility    Modified Rankin (Stroke Patients  Only)       Balance Overall balance assessment: Needs assistance Sitting-balance support: No upper extremity supported;Feet supported Sitting balance-Leahy Scale: Fair Sitting balance - Comments: able to weight shift with use of BUE for support   Standing balance support: Bilateral upper extremity supported;During functional activity Standing balance-Leahy Scale: Poor Standing balance comment: reliant on BUE support on RW or leaning trunk against surface to maintain static standing balance.                    Cognition Arousal/Alertness: Awake/alert Behavior During Therapy: Flat affect Overall Cognitive Status: Within Functional Limits for tasks assessed                      Exercises General Exercises - Lower Extremity Quad Sets: AROM;Both;10 reps;Supine Amputee Exercises Gluteal Sets: AROM;Both;10 reps;Supine Towel Squeeze: AROM;Both;5 reps;Supine Hip Extension: AROM;Right;5 reps;Sidelying Hip ABduction/ADduction: AROM;Both;5 reps;Sidelying Hip Flexion/Marching: AROM;Both;10 reps;Supine Chair Push Up: Strengthening;5 reps;Seated (for pressure relief) Other Exercises Other Exercises: Pt given handout with full exercises regimen    General Comments General comments (skin integrity, edema, etc.): Pt refuses to practice steps.  Mom states pt does well on steps and she feels ok if he doesn't practice.  OT discussed pressure relief with pt and family and they feel good about this.  Gave pt a handout regarding positioning for AKA to prevent contractures as well as exercises that pt can perform.  Reviewed exercises with mom as pt had fallen asleep. She  states they were doing some of these exercises when pt had BKA.        Pertinent Vitals/Pain Pain Assessment: Faces Faces Pain Scale: Hurts even more Pain Location: right residual limb Pain Descriptors / Indicators: Aching;Spasm Pain Intervention(s): Limited activity within patient's tolerance;Monitored during  session;Premedicated before session;Repositioned  VSS    Home Living Family/patient expects to be discharged to:: Private residence Living Arrangements: Parent;Non-relatives/Friends Available Help at Discharge: Family;Available 24 hours/day (mom, sister, friend) Type of Home: Mobile home Home Access: Stairs to enter Entrance Stairs-Rails: Can reach both;Left;Right Home Layout: One level Home Equipment: Walker - 2 wheels;Crutches;Toilet riser;Tub bench;Adaptive equipment Additional Comments: Worked as a Physiological scientist. Mother lives next door.    Prior Function Level of Independence: Needs assistance  Gait / Transfers Assistance Needed: modified independent with RW; two person assist on steps (?uses RW forwards, per pt) ADL's / Homemaking Assistance Needed: Independent with all ADLs. Family assists for IADLs.     PT Goals (current goals can now be found in the care plan section) Acute Rehab PT Goals Patient Stated Goal: to eventually get a prosthesis and walk again Progress towards PT goals: Progressing toward goals    Frequency  Min 5X/week    PT Plan Current plan remains appropriate    Co-evaluation             End of Session Equipment Utilized During Treatment: Gait belt Activity Tolerance: Patient limited by pain;Patient limited by lethargy Patient left: in bed;with call bell/phone within reach;with family/visitor present     Time: 1610-9604 PT Time Calculation (min) (ACUTE ONLY): 9 min  Charges:  $Therapeutic Exercise: 8-22 mins                    G CodesTawni Millers [name] 11-14-2015, 12:34 PM Edward [name],PT Acute Rehabilitation [phone] (pager)

## 2015-10-31 NOTE — Progress Notes (Addendum)
  Progress Note    10/31/2015 7:18 AM 3 Days Post-Op  Subjective:  Pain better po pain medication; c/o muscle cramping  Afebrile HR 100's NSR 130's systolic 96% RA   Filed Vitals:   10/30/15 2004 10/31/15 0409  BP: 132/84 137/89  Pulse: 103 100  Temp: 98.3 F (36.8 C) 98.4 F (36.9 C)  Resp: 18 19    Physical Exam: Incisions:  Clean and dry with staples in tact   CBC    Component Value Date/Time   WBC 6.5 10/30/2015 0238   WBC 6.7 06/22/2011 1506   RBC 3.57* 10/30/2015 0238   RBC 4.77 06/22/2011 1506   HGB 10.9* 10/30/2015 0238   HGB 16.2 06/22/2011 1506   HCT 34.9* 10/30/2015 0238   HCT 46.3 06/22/2011 1506   PLT 314 10/30/2015 0238   PLT 192 06/22/2011 1506   MCV 97.8 10/30/2015 0238   MCV 97 06/22/2011 1506   MCH 30.5 10/30/2015 0238   MCH 34.0 06/22/2011 1506   MCHC 31.2 10/30/2015 0238   MCHC 34.9 06/22/2011 1506   RDW 12.7 10/30/2015 0238   RDW 12.4 06/22/2011 1506   LYMPHSABS 1.6 10/25/2015 1106   MONOABS 1.2* 10/25/2015 1106   EOSABS 0.1 10/25/2015 1106   BASOSABS 0.0 10/25/2015 1106    BMET    Component Value Date/Time   NA 136 10/30/2015 0238   NA 143 06/22/2011 1506   K 4.4 10/30/2015 0238   K 3.9 06/22/2011 1506   CL 94* 10/30/2015 0238   CL 106 06/22/2011 1506   CO2 37* 10/30/2015 0238   CO2 27 06/22/2011 1506   GLUCOSE 92 10/30/2015 0238   GLUCOSE 84 06/22/2011 1506   BUN 6 10/30/2015 0238   BUN 16 06/22/2011 1506   CREATININE 0.61 10/30/2015 0238   CREATININE 1.11 06/22/2011 1506   CALCIUM 8.6* 10/30/2015 0238   CALCIUM 9.1 06/22/2011 1506   GFRNONAA >60 10/30/2015 0238   GFRNONAA >60 06/22/2011 1506   GFRAA >60 10/30/2015 0238   GFRAA >60 06/22/2011 1506    INR    Component Value Date/Time   INR 1.54* 10/26/2015 0403     Intake/Output Summary (Last 24 hours) at 10/31/15 0718 Last data filed at 10/31/15 0535  Gross per 24 hour  Intake    600 ml  Output   2275 ml  Net  -1675 ml     Assessment/Plan:  45 y.o.  male is s/p right above knee amputation  3 Days Post-Op  -stump is viable- clean and dry with staples in tact -will order a retention sock and that way, biotech can already be involved regarding prosthesis in the future.   -pain is fairly controlled with po pain medication  -mobilize   Doreatha MassedSamantha Rhyne, PA-C [facility] [phone] 10/31/2015 7:18 AM  Addendum  Pt still requiring IV narcotics.  Given pt came in on 10 mg oxycodone, suspect will have problems with titration of pain regimen.  Will get Palliative Care's assistance with an oral pain regimen.  - Pt can be discharged once pain tolerable with oral medications.  Leonides SakeBrian Chen, MD [facility] [place] Office: [phone] Pager: [phone]  10/31/2015, 9:19 AM

## 2015-10-31 NOTE — Progress Notes (Signed)
Orthopedic Tech Progress Note Patient Details:  Edward [name] [birth year] [id number] Brace order completed by bio-tech vendor. [name], male   DOB: [birth year], 45 y.o.   MRN: [id number]   Edward [name], Edward [name] 10/31/2015, 3:49 PM

## 2015-10-31 NOTE — Consult Note (Addendum)
Consultation Note Date: 10/31/2015   Patient Name: Edward [name]  DOB: [birth year]  MRN: [id number]  Age / Sex: 45 y.o., male  PCP: Avis Epley, PA-C Referring Physician: Sherren Kerns, MD  Reason for Consultation: Pain control  HPI/Patient Profile: 45 y.o. male  admitted on 10/25/2015   Clinical Assessment and Goals of Care:   45 year old gentleman with coronary artery disease gout history of DVT/PE maintained on Xarelto history of emphysema ongoing tobacco use. Past medical history also positive for peripheral vascular disease status post right lower shotty femoral to popliteal bypass with redo and left lower extremity thrombectomy in March 2017. Patient has had ongoing problems with his right lower extremity for the past several months. On 6-23 he underwent right-sided BKA, however had poor healing wound with drainage and cellulitis. This was treated with antibiotics however patient was admitted on 7-11 for pain and swelling and concerns of hematoma versus infection. On 7-14 he underwent right-sided AKA. Postoperatively, pain control has been a challenge. Patient has not been able to participate much with therapies as much as he would've liked. Palliative care consultation for adequate pain management recommendations has been requested.  Patient is a young gentleman resting in bed. His mother is present at the bedside. He can. Patient is not a long-term opioid user. He has remote history of substance use, none current.  He was only recently given oxycodone in the outpatient setting after his BKA. Patient complains of phantom limb pain. Patient complains of muscle cramps and excruciating pain in his right lower extremity. He does not believe that the Percocet helps. He states he go straight to the IV Dilaudid because it provides the most relief. At home, the patient was taking at least a total of 50 mg of  oral oxycodone in a 24-hour period.  Patient's medication history in this hospitalization reviewed. He has required 5 dosages of 1 mg each of IV Dilaudid in the last 24 hours. He has required a total of 10 tablets of Percocet 5-3 25 in the past 24 hours. This equates to 50 mg of oxycodone as well as 3250 mg of Tylenol.  Patient's pain regimen discussed with the patient in great detail. He knows he cannot get IV Dilaudid outside of the hospital setting. Will recommend switching to oxycodone immediate release 10 mg every 3 hours to be taken on an as-needed basis. We'll recommend continuation of Tylenol on an as-needed basis monitor total daily acetaminophen usage. Patient is to go home with home health and home physical therapy. I have called Dr. Odette Fraction office to set the patient up with the pain management specialist in the outpatient setting. Continue Cymbalta and Lyrica for now. See recommendations below. Thank you for the consult.  NEXT OF KIN  mother is primary caregiver. Reportedly has a spouse and sister.   SUMMARY OF RECOMMENDATIONS    1. Resume OxyIR 10 mg. Patient was taking 10 mg PO every 4-6 hours or so. Will not start long acting pain medications.   2. D/C  Percocet, patient feels it is not effective, hence, he is asking primarily for IV Dilaudid, the total tylenol he is ingesting via percocet and prn tylenol might be approaching maximum daily dosing levels.   3. Agree with bowel and anti emetic regimen which the patient is already on.   4. Change IV Dilaudid to Q 6 hour prn intervals. Patient has been instructed to first take Oxy IR PO PRN, try tylenol PRN and only ask for IV Dilaudid for break through if pain is more than 6/10. He states he is aware that he will not get IV Dilaudid at home. Discussed with him that the goal is to get adequate pain relief so that he may participate in therapies and increase his functional status.   5. Possible element of phantom limb pain,  neuropathy: continue Cymbalta and patient also on Lyrica.   6. Recommend out patient pain management folllow up. Mother states she has been unsuccessful in obtaining a pain management referral. I have called Dr Odette Fraction ( pain management through [facility]). His office has asked that a referral form be faxed to them at [phone] on D/C. Please coordinate this at the time of d/c.   Thank you for the consult. I have taken the liberty to enter the medication changes as mentioned above.   Code Status/Advance Care Planning:  Full code    Symptom Management:    see above   Palliative Prophylaxis:   Bowel Regimen  Additional Recommendations (Limitations, Scope, Preferences):  Full Scope Treatment  Psycho-social/Spiritual:   Desire for further Chaplaincy support:no  Additional Recommendations: Referral to Community Resources   Prognosis:   Expect patient to gain a good degree of functionality through adequate pain control and therapies in the post d/c phase.   Discharge Planning: Home with Home health care, home PT      Primary Diagnoses: Present on Admission:  . Leg hematoma  I have reviewed the medical record, interviewed the patient and family, and examined the patient. The following aspects are pertinent.  Past Medical History  Diagnosis Date  . Pneumothorax 2006    s/p chest tube placement x 2   . Emphysema   . DVT (deep venous thrombosis) (HCC)     right leg-takes Xarelto daily bridging with Lovenox for surgery  . Coronary artery disease   . Peripheral vascular disease (HCC)   . Pulmonary embolism (HCC) 2016  . Emphysema lung (HCC)   . Shortness of breath dyspnea     with exertion  . Vertigo     rarely but doesn't take any meds  . Peripheral neuropathy (HCC)     takes Gabapentin daily  . History of gout     not on any meds  . Collagen vascular disease (HCC)   . Dizziness 07-16-2015  . Myocardial infarction (HCC) 2012  .  Depression   . Arthritis     "hands" (10/17/2015)  . Hematoma hospitalized 10/25/2015    diffuse circumferential edema right BKA tender to palpation   Social History   Social History  . Marital Status: Divorced    Spouse Name: N/A  . Number of Children: N/A  . Years of Education: N/A   Social History Main Topics  . Smoking status: Current Every Day Smoker -- 1.00 packs/day for 25 years    Types: Cigarettes  . Smokeless tobacco: Never Used  . Alcohol Use: 3.6 oz/week    6 Cans of beer, 0 Standard drinks or equivalent per  week  . Drug Use: Yes    Special: Marijuana, Cocaine     Comment: 10/17/2015 "last drug use was in the 1990s"  . Sexual Activity: Not Currently   Other Topics Concern  . None   Social History Narrative   Family History  Problem Relation Age of Onset  . Family history unknown: Yes   Scheduled Meds: . docusate sodium  100 mg Oral BID  . DULoxetine  60 mg Oral Daily  . gabapentin  300 mg Oral TID  . lactose free nutrition  237 mL Oral TID BM  . pantoprazole  40 mg Oral Daily  . potassium chloride  20-40 mEq Oral Once  . pregabalin  75 mg Oral BID  . rivaroxaban  20 mg Oral Q lunch  . sodium chloride flush  3 mL Intravenous Q12H   Continuous Infusions: . sodium chloride 10 mL/hr at 10/29/15 2221   PRN Meds:.sodium chloride, acetaminophen **OR** acetaminophen, alum & mag hydroxide-simeth, bisacodyl, guaiFENesin-dextromethorphan, hydrALAZINE, HYDROmorphone (DILAUDID) injection, labetalol, nitroGLYCERIN, ondansetron, oxyCODONE, phenol, potassium chloride, sodium chloride flush Medications Prior to Admission:  Prior to Admission medications   Medication Sig Start Date End Date Taking? Authorizing Provider  DULoxetine (CYMBALTA) 60 MG capsule Take 60 mg by mouth daily.   Yes Historical Provider, MD  gabapentin (NEURONTIN) 300 MG capsule Take 300 mg by mouth 3 (three) times daily.   Yes Historical Provider, MD  LYRICA 75 MG capsule Take 75 mg by mouth 2 (two)  times daily. 10/03/15  Yes Historical Provider, MD  nitroGLYCERIN (NITROSTAT) 0.4 MG SL tablet Place 0.4 mg under the tongue every 5 (five) minutes as needed for chest pain.    Yes Historical Provider, MD  rivaroxaban (XARELTO) 20 MG TABS tablet Take 20 mg by mouth daily with lunch.    Yes Historical Provider, MD   Allergies  Allergen Reactions  . Keflex [Cephalexin] Nausea And Vomiting  . Lisinopril Other (See Comments)    HYPOTENSION   Review of Systems + for pain.   Physical Exam Young gentleman resting in bed S1 S2 Clear Abdomen soft R AKA dressing clean Non focal  Vital Signs: BP 141/86 mmHg  Pulse 88  Temp(Src) 97.9 F (36.6 C) (Oral)  Resp 18  Ht 5\' 6"  (1.676 m)  Wt 51.5 kg (113 lb 8.6 oz)  BMI 18.33 kg/m2  SpO2 96% Pain Assessment: 0-10 POSS *See Group Information*: S-Acceptable,Sleep, easy to arouse Pain Score: 7    SpO2: SpO2: 96 % O2 Device:SpO2: 96 % O2 Flow Rate: .O2 Flow Rate (L/min): 2 L/min  IO: Intake/output summary:  Intake/Output Summary (Last 24 hours) at 10/31/15 1335 Last data filed at 10/31/15 0535  Gross per 24 hour  Intake    240 ml  Output   1950 ml  Net  -1710 ml    LBM: Last BM Date: 10/30/15 Baseline Weight: Weight: 51.5 kg (113 lb 8.6 oz) Most recent weight: Weight: 51.5 kg (113 lb 8.6 oz)     Palliative Assessment/Data:   Flowsheet Rows        Most Recent Value   Intake Tab    Referral Department  Surgery   Unit at Time of Referral  Med/Surg Unit   Palliative Care Primary Diagnosis  Other (Comment)   Palliative Care Type  New Palliative care   Reason for referral  Pain   Date first seen by Palliative Care  10/31/15   Clinical Assessment    Palliative Performance Scale Score  40%   Pain  Max last 24 hours  8   Pain Min Last 24 hours  6   Psychosocial & Spiritual Assessment    Palliative Care Outcomes    Patient/Family meeting held?  Yes   Who was at the meeting?  patient, mother    Other Treatment Preference  Instructions  referred to Dr Odette Fraction pain management office       Time In:  1230 Time Out:  1330 Time Total:  60 min  Greater than 50%  of this time was spent counseling and coordinating care related to the above assessment and plan.  Signed by: Rosalin Hawking, MD  [phone]  Please contact Palliative Medicine Team phone at [phone] for questions and concerns.  For individual provider: See Loretha Stapler

## 2015-10-31 NOTE — Telephone Encounter (Signed)
Sched appt 8/3 at 3:00. Ph# has no vm, emergency contact is a wrong #, mailed appt letter through regular mail to inform pt.

## 2015-10-31 NOTE — Progress Notes (Signed)
Occupational Therapy Evaluation Patient Details Name: Edward RedbirdKevin [name] MRN: [id number] DOB: April 30, 1970 Today's Date: 10/31/2015    History of Present Illness 45 y.o. 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 Adm with some drainage, swelling, erythema in the stump. s/p I&D 7/12 with hematoma, infarcted gastroc, and purulent drainage. 7/14 to OR for Rt AKA   Clinical Impression   PTA, pt was independent with ADLs and used RW for mobility. Pt currently requires min guard assist for all ADLs and basic transfers due to increase R residual limb pain and resulting balance deficits. Educated pt/mother on pressure relief strategies and edema management. Pt plans to d/c home with 24/7 assistance from his wife, sister, and friend. Pt will benefit from continued acute OT to increase independence and safety with ADLs and mobility to allow for safe discharge home. Recommend HHOT.    Follow Up Recommendations  Home health OT;Supervision/Assistance - 24 hour    Equipment Recommendations  None recommended by OT    Recommendations for Other Services       Precautions / Restrictions Precautions Precautions: Fall Restrictions Weight Bearing Restrictions: Yes RLE Weight Bearing: Non weight bearing      Mobility Bed Mobility Overal bed mobility: Modified Independent             General bed mobility comments: HOB elevated, increased time and effort to relax R residual limb to achieve neutral hip position.  Transfers Overall transfer level: Needs assistance Equipment used: Rolling walker (2 wheeled) Transfers: Sit to/from Stand Sit to Stand: Supervision         General transfer comment: Supervision for safety. No physical assist required, no LOB observed or reports of dizziness upon standing. Pt with good demonstration of safe hand placement, proper RW use, and overall safe strategies when mobilizing.    Balance Overall balance assessment: Needs assistance Sitting-balance support: No upper  extremity supported;Feet supported Sitting balance-Leahy Scale: Fair Sitting balance - Comments: able to weight shift with use of BUE for support   Standing balance support: Bilateral upper extremity supported;During functional activity Standing balance-Leahy Scale: Poor Standing balance comment: reliant on BUE support on RW or leaning trunk against surface to maintain static standing balance.                            ADL Overall ADL's : Needs assistance/impaired     Grooming: Wash/dry hands;Wash/dry face;Oral care;Supervision/safety;Standing   Upper Body Bathing: Supervision/ safety;Sitting   Lower Body Bathing: Min guard;Sit to/from stand Lower Body Bathing Details (indicate cue type and reason): able to reach L foot  Upper Body Dressing : Supervision/safety;Sitting   Lower Body Dressing: Min guard;Sit to/from stand Lower Body Dressing Details (indicate cue type and reason): able to reach L foot Toilet Transfer: Supervision/safety;Comfort height toilet;RW;Ambulation   Toileting- Clothing Manipulation and Hygiene: Supervision/safety;Sit to/from stand       Functional mobility during ADLs: Supervision/safety;Rolling walker General ADL Comments: Began education on pressure relief strategies (leaning side-side to offload back side, chair push ups, use of pressure relief pad, mattress options).      Vision Vision Assessment?: No apparent visual deficits   Perception     Praxis      Pertinent Vitals/Pain Pain Assessment: Faces Faces Pain Scale: Hurts whole lot Pain Location: R residual limb in sitting position Pain Descriptors / Indicators: Aching;Spasm Pain Intervention(s): Monitored during session;Limited activity within patient's tolerance;Premedicated before session;Repositioned     Hand Dominance Right   Extremity/Trunk Assessment Upper Extremity Assessment  Upper Extremity Assessment: Overall WFL for tasks assessed (overall 4/5 strength)   Lower  Extremity Assessment Lower Extremity Assessment: RLE deficits/detail RLE Deficits / Details: R AKA, 40 (standing)-100 (sitting) hip flexion when attempting transfers or bed mobility - requires increased time/effort to achieve neutral hip position and encourage hip extension RLE Coordination: decreased gross motor   Cervical / Trunk Assessment Cervical / Trunk Assessment: Normal   Communication Communication Communication: No difficulties   Cognition Arousal/Alertness: Awake/alert Behavior During Therapy: Flat affect Overall Cognitive Status: Within Functional Limits for tasks assessed                     General Comments       Exercises Exercises: Amputee Other Exercises Other Exercises: Reviewed edema management - pt reported that Dr. Darrick Penna does not want pt to place pillow under R residual limb at all. Began education on pressure relief strategies including leaning side-side when sitting in chair, chair push ups, use of pressure relief pad, and mattress adaptations.    Shoulder Instructions      Home Living Family/patient expects to be discharged to:: Private residence Living Arrangements: Parent;Non-relatives/Friends Available Help at Discharge: Family;Available 24 hours/day (mom, sister, friend) Type of Home: Mobile home Home Access: Stairs to enter Entrance Stairs-Number of Steps: 3 Entrance Stairs-Rails: Can reach both;Left;Right Home Layout: One level     Bathroom Shower/Tub: Tub/shower unit Shower/tub characteristics: Curtain Firefighter: Standard     Home Equipment: Environmental consultant - 2 wheels;Crutches;Toilet riser;Tub bench;Adaptive equipment Adaptive Equipment: Reacher;Sock aid Additional Comments: Worked as a Physiological scientist. Mother lives next door.      Prior Functioning/Environment Level of Independence: Needs assistance  Gait / Transfers Assistance Needed: modified independent with RW; two person assist on steps (?uses RW forwards,  per pt) ADL's / Homemaking Assistance Needed: Independent with all ADLs. Family assists for IADLs.        OT Diagnosis: Acute pain   OT Problem List: Decreased strength;Decreased range of motion;Decreased activity tolerance;Impaired balance (sitting and/or standing);Decreased coordination;Decreased safety awareness;Pain   OT Treatment/Interventions: Self-care/ADL training;Therapeutic exercise;DME and/or AE instruction;Therapeutic activities;Energy conservation;Patient/family education;Balance training    OT Goals(Current goals can be found in the care plan section) Acute Rehab OT Goals Patient Stated Goal: to eventually get a prosthesis and walk again OT Goal Formulation: With patient Time For Goal Achievement: 11/14/15 Potential to Achieve Goals: Good ADL Goals Pt Will Perform Lower Body Dressing: with modified independence;sit to/from stand Pt Will Transfer to Toilet: with modified independence;ambulating;regular height toilet Pt Will Perform Toileting - Clothing Manipulation and hygiene: with modified independence;sit to/from stand Pt Will Perform Tub/Shower Transfer: Tub transfer;with modified independence;ambulating;tub bench;rolling walker Additional ADL Goal #1: Pt will independently complete 3 pressure relief strategies with no verbal cues.  OT Frequency: Min 3X/week   Barriers to D/C:            Co-evaluation              End of Session Equipment Utilized During Treatment: Gait belt;Rolling walker Nurse Communication: Mobility status;Other (comment) (No chair alarm pad on due to pressure relief strategies)  Activity Tolerance: Patient tolerated treatment well Patient left: in chair;with call bell/phone within reach;with family/visitor present   Time: [id number] OT Time Calculation (min): 35 min Charges:  OT General Charges $OT Visit: 1 Procedure OT Evaluation $OT Eval Moderate Complexity: 1 Procedure OT Treatments $Self Care/Home Management : 8-22  mins G-Codes:    Nils Pyle, OTR/L Pager: [phone] 10/31/2015, 9:51  AM    

## 2015-11-01 DIAGNOSIS — Z515 Encounter for palliative care: Secondary | ICD-10-CM

## 2015-11-01 DIAGNOSIS — L03115 Cellulitis of right lower limb: Secondary | ICD-10-CM

## 2015-11-01 DIAGNOSIS — M79604 Pain in right leg: Secondary | ICD-10-CM

## 2015-11-01 DIAGNOSIS — I739 Peripheral vascular disease, unspecified: Secondary | ICD-10-CM

## 2015-11-01 LAB — ACID FAST SMEAR (AFB, MYCOBACTERIA): Acid Fast Smear: NEGATIVE

## 2015-11-01 MED ORDER — OXYCODONE HCL 10 MG PO TABS: ORAL_TABLET | ORAL | Status: DC

## 2015-11-01 NOTE — Progress Notes (Signed)
  Progress Note    11/01/2015 7:46 AM 4 Days Post-Op  Subjective:  Pain is better controlled.  No IV pain medication.  Feels sweaty this morning  Afebrile   Filed Vitals:   10/31/15 2008 11/01/15 0509  BP: 150/93 137/83  Pulse: 113 95  Temp: 98.2 F (36.8 C) 98.3 F (36.8 C)  Resp: 18 15    Physical Exam: Incisions:  Clean and dry and healing nicely with staples in tact.  CBC    Component Value Date/Time   WBC 6.5 10/30/2015 0238   WBC 6.7 06/22/2011 1506   RBC 3.57* 10/30/2015 0238   RBC 4.77 06/22/2011 1506   HGB 10.9* 10/30/2015 0238   HGB 16.2 06/22/2011 1506   HCT 34.9* 10/30/2015 0238   HCT 46.3 06/22/2011 1506   PLT 314 10/30/2015 0238   PLT 192 06/22/2011 1506   MCV 97.8 10/30/2015 0238   MCV 97 06/22/2011 1506   MCH 30.5 10/30/2015 0238   MCH 34.0 06/22/2011 1506   MCHC 31.2 10/30/2015 0238   MCHC 34.9 06/22/2011 1506   RDW 12.7 10/30/2015 0238   RDW 12.4 06/22/2011 1506   LYMPHSABS 1.6 10/25/2015 1106   MONOABS 1.2* 10/25/2015 1106   EOSABS 0.1 10/25/2015 1106   BASOSABS 0.0 10/25/2015 1106    BMET    Component Value Date/Time   NA 136 10/30/2015 0238   NA 143 06/22/2011 1506   K 4.4 10/30/2015 0238   K 3.9 06/22/2011 1506   CL 94* 10/30/2015 0238   CL 106 06/22/2011 1506   CO2 37* 10/30/2015 0238   CO2 27 06/22/2011 1506   GLUCOSE 92 10/30/2015 0238   GLUCOSE 84 06/22/2011 1506   BUN 6 10/30/2015 0238   BUN 16 06/22/2011 1506   CREATININE 0.61 10/30/2015 0238   CREATININE 1.11 06/22/2011 1506   CALCIUM 8.6* 10/30/2015 0238   CALCIUM 9.1 06/22/2011 1506   GFRNONAA >60 10/30/2015 0238   GFRNONAA >60 06/22/2011 1506   GFRAA >60 10/30/2015 0238   GFRAA >60 06/22/2011 1506    INR    Component Value Date/Time   INR 1.54* 10/26/2015 0403    No intake or output data in the 24 hours ending 11/01/15 0746   Assessment/Plan:  45 y.o. male is s/p right above knee amputation  4 Days Post-Op  -stump is viable -pt's pain is  controlled -will ask case management to make sure HHPT is still active. -d/c later this morning. -fu with Dr. Darrick PennaFields in 3-4 weeks.   Doreatha MassedSamantha Cameryn Chrisley, PA-C [facility] [phone] 11/01/2015 7:46 AM

## 2015-11-01 NOTE — Progress Notes (Signed)
Daily Progress Note   Patient Name: Edward [name]       Date: 11/01/2015 DOB: [birth year]  Age: 45 y.o. MRN#: [id number] Attending Physician: Elam Dutch, MD Primary Care Physician: Jana Half Admit Date: 10/25/2015  Reason for Consultation/Follow-up: Pain control  Subjective: Met with patient and his sister today.  He reports that his pain is much better controlled on current regimen. Reports looking forward to being d/c soon. We discussed need for continued f/u as OP for continued titration of his pain regimen.   Length of Stay: 7  Current Medications: Scheduled Meds:  . docusate sodium  100 mg Oral BID  . DULoxetine  60 mg Oral Daily  . gabapentin  300 mg Oral TID  . lactose free nutrition  237 mL Oral TID BM  . pantoprazole  40 mg Oral Daily  . potassium chloride  20-40 mEq Oral Once  . pregabalin  75 mg Oral BID  . rivaroxaban  20 mg Oral Q lunch  . sodium chloride flush  3 mL Intravenous Q12H    Continuous Infusions: . sodium chloride 10 mL/hr at 10/29/15 2221    PRN Meds: sodium chloride, acetaminophen **OR** acetaminophen, alum & mag hydroxide-simeth, bisacodyl, guaiFENesin-dextromethorphan, hydrALAZINE, HYDROmorphone (DILAUDID) injection, labetalol, nitroGLYCERIN, ondansetron, oxyCODONE, phenol, potassium chloride, sodium chloride flush  Physical Exam         General: Alert, awake, in no acute distress.  HEENT: No bruits, no goiter, no JVD Heart: Regular rate and rhythm. No murmur appreciated. Lungs: Good air movement, clear Abdomen: Soft, nontender, nondistended, positive bowel sounds.  Ext: R AKA dressing clean Skin: Warm and dry Neuro: Grossly intact, nonfocal.   Vital Signs: BP 137/83 mmHg  Pulse 95  Temp(Src) 98.3 F (36.8 C) (Oral)   Resp 15  Ht _0  (1.676 m)  Wt 51.5 kg (113 lb 8.6 oz)  BMI 18.33 kg/m2  SpO2 96% SpO2: SpO2: 96 % O2 Device: O2 Device: Not Delivered O2 Flow Rate: O2 Flow Rate (L/min): 2 L/min  Intake/output summary:  Intake/Output Summary (Last 24 hours) at 11/01/15 1009 Last data filed at 11/01/15 0800  Gross per 24 hour  Intake      0 ml  Output    650 ml  Net   -650 ml   LBM: Last BM Date: 10/31/15 Baseline Weight: Weight:  51.5 kg (113 lb 8.6 oz) Most recent weight: Weight: 51.5 kg (113 lb 8.6 oz)       Flowsheet Rows        Most Recent Value   Intake Tab    Referral Department  Surgery   Unit at Time of Referral  Med/Surg Unit   Palliative Care Primary Diagnosis  Other (Comment)   Date Notified  10/31/15   Palliative Care Type  New Palliative care   Reason for referral  Pain   Date of Admission  10/25/15   Date first seen by Palliative Care  10/31/15   # of days Palliative referral response time  0 Day(s)   # of days IP prior to Palliative referral  6   Clinical Assessment    Palliative Performance Scale Score  40%   Pain Max last 24 hours  8   Pain Min Last 24 hours  6   Psychosocial & Spiritual Assessment    Palliative Care Outcomes    Patient/Family meeting held?  Yes   Who was at the meeting?  patient, mother    Other Treatment Preference Instructions  referred to Dr Clydell Hakim pain management office       Patient Active Problem List   Diagnosis Date Noted  . Cellulitis of right lower extremity   . PVD (peripheral vascular disease) ([place])   . Encounter for palliative care   . Pain In Right Leg   . Leg hematoma 10/25/2015  . Cellulitis of right leg without foot 10/17/2015  . Ischemic rest pain of lower extremity ([place]) 09/27/2015  . PAD (peripheral artery disease) ([place]) 07/04/2015  . Atherosclerosis of extremity with rest pain ([place]) 07/01/2015  . Atherosclerotic peripheral vascular disease of extremity ([place]) 05/30/2015  . Pre-operative cardiovascular examination  05/26/2015  . Pulmonary embolism ([place]) 09/17/2014  . NSTEMI (non-ST elevated myocardial infarction) ([place]) 05/02/2011  . Hyperlipidemia 05/02/2011  . Gout 05/02/2011  . CAD (coronary artery disease) 10/13/2010  . Smoking 10/13/2010    Palliative Care Assessment & Plan   Recommendations/Plan: 1. Pain much better controlled this AM.  Recommend continue current regimen on d/c and he will need to f/u with OP pain management.  Dr. Rowe Pavy spoke with office of Dr Clydell Hakim ( pain management through [facility]). His office has asked that a referral form be faxed to them at [phone] on D/C. Please coordinate this at the time of d/c.   2. Possible element of phantom limb pain, neuropathy: continue Cymbalta and patient also on Lyrica.   3. Agree with bowel and anti emetic regimen which the patient is already on.   Goals of Care and Additional Recommendations:  Limitations on Scope of Treatment: Full Scope Treatment  Code Status:    Code Status Orders        Start     Ordered   10/25/15 1225  Full code   Continuous     10/25/15 1229    Code Status History    Date Active Date Inactive Code Status Order ID Comments User Context   10/17/2015 12:54 PM 10/19/2015  5:06 PM Full Code [id number]  Elam Dutch, MD ED   07/01/2015  1:25 AM 07/08/2015  3:29 PM Full Code [id number]  Conrad Weingarten, MD ED   05/18/2015 11:16 AM 05/18/2015  5:36 PM Full Code [id number]  Serafina Mitchell, MD Inpatient   05/18/2015 11:15 AM 05/18/2015 11:16 AM Full Code [id number]  Serafina Mitchell, MD  Inpatient   09/18/2014 12:16 AM 09/18/2014  3:55 PM Full Code [id number]  Oswald Hillock, MD Inpatient       Prognosis:   Expect patient to gain a good degree of functionality through adequate pain control and therapies in the post d/c phase  Discharge Planning:  Home with Home Health, home PT  Care plan was discussed with patient  Thank you for allowing the Palliative Medicine Team to assist in the care of  this patient.   Time In: 0850 Time Out: 0910 Total Time 20 Prolonged Time Billed No      Greater than 50%  of this time was spent counseling and coordinating care related to the above assessment and plan.  Micheline Rough, MD  Please contact Palliative Medicine Team phone at [phone] for questions and concerns.

## 2015-11-01 NOTE — Care Management Note (Signed)
Case Management Note Donn PieriniKristi Rayford Williamsen RN, BSN Unit 2W-Case Manager [phone]  Patient Details  Name: Edward RedbirdKevin [name] MRN: [id number] Date of Birth: 11-Sep-1970  Subjective/Objective:  Pt admitted - Poorly healing right BKA ? Hematoma from xarelto versus infection PLAN: Admit. IV antibiotics. Plan for I and D right BKA on 7/12                 Action/Plan: PTA pt lived at home- [facility] - will need resumption orders on discharge- CM to follow  Expected Discharge Date:    11/01/15              Expected Discharge Plan:  Home w Home Health Services  In-House Referral:     Discharge planning Services  CM Consult  Post Acute Care Choice:  Home Health, Resumption of Svcs/PTA Provider Choice offered to:  Patient  DME Arranged:    DME Agency:     HH Arranged:  PT HH Agency:  [facility]  Status of Service:  Completed, signed off  If discussed at Long Length of Stay Meetings, dates discussed:    Additional Comments:  11/01/15- 1000- Edward Poirier RN, CM- pt for d/c home today- [facility]- [facility]- [facility]ikesSusan [facility] and [facility].   Edward [name], Edward Jarrett Hall, RN 11/01/2015, 10:14 AM

## 2015-11-01 NOTE — Discharge Summary (Signed)
Discharge Summary    Edward [name] Sep 05, 1970 45 y.o. male  [id number]  Admission Date: 10/25/2015  Discharge Date: 11/01/15  Physician: Sherren Kernsharles E Fields, MD  Admission Diagnosis: Swelling [R60.9] Cellulitis of right lower extremity [L03.115]   HPI:   This is a 45 y.o. male S/p Right BKA about a month ago. He was in the hospital last week with some drainage and erythema in the stump. This improved and he was sent home. He now has worsening swelling and pain in the stump with some bloody drainage. He is on xarelto for PE about 1 year ago. He states he did have a fever yesterday.  Hospital Course:  The patient was admitted to the hospital and taken to the operating room on 10/26/2015 & underwent an I&D of right BKA with debridement.  Operative findings included #1 old blood with some purulent component of aerobic and anaerobic cultures taken #2 infarcted gastrocnemius muscle.  On 10/28/2015 he was taken back to the operating room and underwent: Right AKA.    The pt tolerated the procedure well and was transported to the PACU in good condition.   On POD 1, he did have some tachycardia, which was most likely due to pain.  He was evaluated by PT and recommended HHPT.  By POD 2, his pain control was still marginal.  His PCA was discontinued and po pain medication was started.   POD 3, biotech was consulted for retention sock and education about prosthesis.  Palliative care was consulted for pain management.  OxyIR was resumed and Percocet discontinued due to being close to reaching maximum doses of Tylenol.  Possible element of phantom pain/neuropathy and he was continued on his Cymbalta and Lyrica.  Palliative care also recommended he return and f/u with outpatient pain management.   He did well with this and was discharged the next day.  The remainder of the hospital course consisted of increasing mobilization and increasing intake of solids without difficulty.  CBC      Component Value Date/Time   WBC 6.5 10/30/2015 0238   WBC 6.7 06/22/2011 1506   RBC 3.57* 10/30/2015 0238   RBC 4.77 06/22/2011 1506   HGB 10.9* 10/30/2015 0238   HGB 16.2 06/22/2011 1506   HCT 34.9* 10/30/2015 0238   HCT 46.3 06/22/2011 1506   PLT 314 10/30/2015 0238   PLT 192 06/22/2011 1506   MCV 97.8 10/30/2015 0238   MCV 97 06/22/2011 1506   MCH 30.5 10/30/2015 0238   MCH 34.0 06/22/2011 1506   MCHC 31.2 10/30/2015 0238   MCHC 34.9 06/22/2011 1506   RDW 12.7 10/30/2015 0238   RDW 12.4 06/22/2011 1506   LYMPHSABS 1.6 10/25/2015 1106   MONOABS 1.2* 10/25/2015 1106   EOSABS 0.1 10/25/2015 1106   BASOSABS 0.0 10/25/2015 1106    BMET    Component Value Date/Time   NA 136 10/30/2015 0238   NA 143 06/22/2011 1506   K 4.4 10/30/2015 0238   K 3.9 06/22/2011 1506   CL 94* 10/30/2015 0238   CL 106 06/22/2011 1506   CO2 37* 10/30/2015 0238   CO2 27 06/22/2011 1506   GLUCOSE 92 10/30/2015 0238   GLUCOSE 84 06/22/2011 1506   BUN 6 10/30/2015 0238   BUN 16 06/22/2011 1506   CREATININE 0.61 10/30/2015 0238   CREATININE 1.11 06/22/2011 1506   CALCIUM 8.6* 10/30/2015 0238   CALCIUM 9.1 06/22/2011 1506   GFRNONAA >60 10/30/2015 0238   GFRNONAA >60 06/22/2011 1506   GFRAA >  60 10/30/2015 0238   GFRAA >60 06/22/2011 1506      Discharge Instructions    Call MD for:  redness, tenderness, or signs of infection (pain, swelling, bleeding, redness, odor or green/yellow discharge around incision site)    Complete by:  As directed      Call MD for:  severe or increased pain, loss or decreased feeling  in affected limb(s)    Complete by:  As directed      Call MD for:  temperature >100.5    Complete by:  As directed      Resume previous diet    Complete by:  As directed      may wash over wound with mild soap and water    Complete by:  As directed   Shower daily with soap and water starting 7/191/17.           Discharge Diagnosis:  Swelling [R60.9] Cellulitis of right  lower extremity [L03.115]  Secondary Diagnosis: Patient Active Problem List   Diagnosis Date Noted  . Cellulitis of right lower extremity   . PVD (peripheral vascular disease) ([facility])   . Encounter for palliative care   . Pain In Right Leg   . Leg hematoma 10/25/2015  . Cellulitis of right leg without foot 10/17/2015  . Ischemic rest pain of lower extremity ([facility]) 09/27/2015  . PAD (peripheral artery disease) ([facility]) 07/04/2015  . Atherosclerosis of extremity with rest pain ([facility]) 07/01/2015  . Atherosclerotic peripheral vascular disease of extremity ([facility]) 05/30/2015  . Pre-operative cardiovascular examination 05/26/2015  . Pulmonary embolism ([facility]) 09/17/2014  . NSTEMI (non-ST elevated myocardial infarction) ([facility]) 05/02/2011  . Hyperlipidemia 05/02/2011  . Gout 05/02/2011  . CAD (coronary artery disease) 10/13/2010  . Smoking 10/13/2010   Past Medical History  Diagnosis Date  . Pneumothorax 2006    s/p chest tube placement x 2   . Emphysema   . DVT (deep venous thrombosis) ([facility])     right leg-takes Xarelto daily bridging with Lovenox for surgery  . Coronary artery disease   . Peripheral vascular disease ([facility])   . Pulmonary embolism ([facility]) 2016  . Emphysema lung ([facility])   . Shortness of breath dyspnea     with exertion  . Vertigo     rarely but doesn't take any meds  . Peripheral neuropathy ([facility])     takes Gabapentin daily  . History of gout     not on any meds  . Collagen vascular disease ([facility])   . Dizziness 07-16-2015  . Myocardial infarction ([facility]) 2012  . Depression   . Arthritis     "hands" (10/17/2015)  . Hematoma hospitalized 10/25/2015    diffuse circumferential edema right BKA tender to palpation       Medication List    TAKE these medications        DULoxetine 60 MG capsule  Commonly known as:  CYMBALTA  Take 60 mg by mouth daily.     gabapentin 300 MG capsule  Commonly known as:  NEURONTIN  Take 300 mg by mouth 3 (three) times daily.     LYRICA 75 MG  capsule  Generic drug:  pregabalin  Take 75 mg by mouth 2 (two) times daily.     nitroGLYCERIN 0.4 MG SL tablet  Commonly known as:  NITROSTAT  Place 0.4 mg under the tongue every 5 (five) minutes as needed for chest pain.     Oxycodone HCl 10 MG Tabs  10mg  every 4-6 hours prn pain.  rivaroxaban 20 MG Tabs tablet  Commonly known as:  XARELTO  Take 20 mg by mouth daily with lunch.        Prescriptions given: Oxycodone #30 No Refill  Instructions: 1.  Shower daily with soap and water starting 11/01/15   Disposition: home with HHPT  Patient's condition: is Good  Follow up: 1. Dr. Darrick Penna in 3-4 weeks   Doreatha Massed, PA-C [facility] [phone] 11/01/2015  10:04 AM

## 2015-11-07 ENCOUNTER — Telehealth: Payer: Self-pay | Admitting: Vascular Surgery

## 2015-11-07 ENCOUNTER — Telehealth: Payer: Self-pay | Admitting: *Deleted

## 2015-11-07 NOTE — Telephone Encounter (Signed)
I left a message for Clydie Braun to call us back to discuss this request. Patient was Rx'd #30 of Oxycodone 10mg  at discharge on 11-01-15. I will offer to move his appt up to see  Rosalita Chessman on Wednesday or Thursday when Dr. Darrick Penna is in the office

## 2015-11-07 NOTE — Telephone Encounter (Signed)
Duplicate encounter

## 2015-11-07 NOTE — Telephone Encounter (Signed)
Edward [name] is supposed to see Dr. Darrick Penna on 8/3 and pain management on 9/6.  The patient is requesting pain medication to last until he sees Dr. Darrick Penna on 8/3.   The patient fell this morning. He has a red spot on the outside of the surgical, site, but no other complications.  His caregiver says that he has a lot of phantom pain in his leg.  Edward [name]

## 2015-11-08 ENCOUNTER — Encounter: Payer: Self-pay | Admitting: Family

## 2015-11-09 ENCOUNTER — Telehealth: Payer: Self-pay

## 2015-11-09 ENCOUNTER — Ambulatory Visit: Payer: PRIVATE HEALTH INSURANCE | Admitting: Family

## 2015-11-09 NOTE — Telephone Encounter (Signed)
rec'd call from pt's mother.  Reported the pt. fell on the right AKA stump on Monday, 11/07/15.  Reported he landed on the incision.  Stated today there is "redness of the incision, and a pus-type drainage on the right side of the stump incision."  Stated the staples are intact. Denied fever/ chills.  Reported the pt. Had an appt. This morning but was unable to keep the appt., due to a court date.  Advised to keep incision clean/ dry, and offered appt. tomorrow at 1:45 PM, with NP.  Verb. Understanding.

## 2015-11-10 ENCOUNTER — Encounter: Payer: PRIVATE HEALTH INSURANCE | Admitting: Vascular Surgery

## 2015-11-10 ENCOUNTER — Encounter: Payer: Self-pay | Admitting: Family

## 2015-11-10 ENCOUNTER — Ambulatory Visit (INDEPENDENT_AMBULATORY_CARE_PROVIDER_SITE_OTHER): Payer: No Typology Code available for payment source | Admitting: Family

## 2015-11-10 VITALS — BP 144/96 | HR 131 | Temp 97.2°F | Resp 18 | Ht 66.0 in | Wt 110.0 lb

## 2015-11-10 DIAGNOSIS — F17200 Nicotine dependence, unspecified, uncomplicated: Secondary | ICD-10-CM

## 2015-11-10 DIAGNOSIS — Z720 Tobacco use: Secondary | ICD-10-CM

## 2015-11-10 DIAGNOSIS — Z89511 Acquired absence of right leg below knee: Secondary | ICD-10-CM

## 2015-11-10 DIAGNOSIS — I779 Disorder of arteries and arterioles, unspecified: Secondary | ICD-10-CM

## 2015-11-10 MED ORDER — OXYCODONE HCL 10 MG PO TABS: ORAL_TABLET | ORAL | 0 refills | Status: DC

## 2015-11-10 NOTE — Patient Instructions (Signed)
Peripheral Vascular Disease Peripheral vascular disease (PVD) is a disease of the blood vessels that are not part of your heart and brain. A simple term for PVD is poor circulation. In most cases, PVD narrows the blood vessels that carry blood from your heart to the rest of your body. This can result in a decreased supply of blood to your arms, legs, and internal organs, like your stomach or kidneys. However, it most often affects a person's lower legs and feet. There are two types of PVD.  Organic PVD. This is the more common type. It is caused by damage to the structure of blood vessels.  Functional PVD. This is caused by conditions that make blood vessels contract and tighten (spasm). Without treatment, PVD tends to get worse over time. PVD can also lead to acute ischemic limb. This is when an arm or limb suddenly has trouble getting enough blood. This is a medical emergency. CAUSES Each type of PVD has many different causes. The most common cause of PVD is buildup of a fatty material (plaque) inside of your arteries (atherosclerosis). Small amounts of plaque can break off from the walls of the blood vessels and become lodged in a smaller artery. This blocks blood flow and can cause acute ischemic limb. Other common causes of PVD include:  Blood clots that form inside of blood vessels.  Injuries to blood vessels.  Diseases that cause inflammation of blood vessels or cause blood vessel spasms.  Health behaviors and health history that increase your risk of developing PVD. RISK FACTORS  You may have a greater risk of PVD if you:  Have a family history of PVD.  Have certain medical conditions, including:  High cholesterol.  Diabetes.  High blood pressure (hypertension).  Coronary heart disease.  Past problems with blood clots.  Past injury, such as burns or a broken bone. These may have damaged blood vessels in your limbs.  Buerger disease. This is caused by inflamed blood  vessels in your hands and feet.  Some forms of arthritis.  Rare birth defects that affect the arteries in your legs.  Use tobacco.  Do not get enough exercise.  Are obese.  Are age 50 or older. SIGNS AND SYMPTOMS  PVD may cause many different symptoms. Your symptoms depend on what part of your body is not getting enough blood. Some common signs and symptoms include:  Cramps in your lower legs. This may be a symptom of poor leg circulation (claudication).  Pain and weakness in your legs while you are physically active that goes away when you rest (intermittent claudication).  Leg pain when at rest.  Leg numbness, tingling, or weakness.  Coldness in a leg or foot, especially when compared with the other leg.  Skin or hair changes. These can include:  Hair loss.  Shiny skin.  Pale or bluish skin.  Thick toenails.  Inability to get or maintain an erection (erectile dysfunction). People with PVD are more prone to developing ulcers and sores on their toes, feet, or legs. These may take longer than normal to heal. DIAGNOSIS Your health care provider may diagnose PVD from your signs and symptoms. The health care provider will also do a physical exam. You may have tests to find out what is causing your PVD and determine its severity. Tests may include:  Blood pressure recordings from your arms and legs and measurements of the strength of your pulses (pulse volume recordings).  Imaging studies using sound waves to take pictures of   the blood flow through your blood vessels (Doppler ultrasound).  Injecting a dye into your blood vessels before having imaging studies using:  X-rays (angiogram or arteriogram).  Computer-generated X-rays (CT angiogram).  A powerful electromagnetic field and a computer (magnetic resonance angiogram or MRA). TREATMENT Treatment for PVD depends on the cause of your condition and the severity of your symptoms. It also depends on your age. Underlying  causes need to be treated and controlled. These include long-lasting (chronic) conditions, such as diabetes, high cholesterol, and high blood pressure. You may need to first try making lifestyle changes and taking medicines. Surgery may be needed if these do not work. Lifestyle changes may include:  Quitting smoking.  Exercising regularly.  Following a low-fat, low-cholesterol diet. Medicines may include:  Blood thinners to prevent blood clots.  Medicines to improve blood flow.  Medicines to improve your blood cholesterol levels. Surgical procedures may include:  A procedure that uses an inflated balloon to open a blocked artery and improve blood flow (angioplasty).  A procedure to put in a tube (stent) to keep a blocked artery open (stent implant).  Surgery to reroute blood flow around a blocked artery (peripheral bypass surgery).  Surgery to remove dead tissue from an infected wound on the affected limb.  Amputation. This is surgical removal of the affected limb. This may be necessary in cases of acute ischemic limb that are not improved through medical or surgical treatments. HOME CARE INSTRUCTIONS  Take medicines only as directed by your health care provider.  Do not use any tobacco products, including cigarettes, chewing tobacco, or electronic cigarettes. If you need help quitting, ask your health care provider.  Lose weight if you are overweight, and maintain a healthy weight as directed by your health care provider.  Eat a diet that is low in fat and cholesterol. If you need help, ask your health care provider.  Exercise regularly. Ask your health care provider to suggest some good activities for you.  Use compression stockings or other mechanical devices as directed by your health care provider.  Take good care of your feet.  Wear comfortable shoes that fit well.  Check your feet often for any cuts or sores. SEEK MEDICAL CARE IF:  You have cramps in your legs  while walking.  You have leg pain when you are at rest.  You have coldness in a leg or foot.  Your skin changes.  You have erectile dysfunction.  You have cuts or sores on your feet that are not healing. SEEK IMMEDIATE MEDICAL CARE IF:  Your arm or leg turns cold and blue.  Your arms or legs become red, warm, swollen, painful, or numb.  You have chest pain or trouble breathing.  You suddenly have weakness in your face, arm, or leg.  You become very confused or lose the ability to speak.  You suddenly have a very bad headache or lose your vision.   This information is not intended to replace advice given to you by your health care provider. Make sure you discuss any questions you have with your health care provider.   Document Released: 05/10/2004 Document Revised: 04/23/2014 Document Reviewed: 09/10/2013 Elsevier Interactive Patient Education 2016 Elsevier Inc[employer]     Steps to Quit Smoking  Smoking tobacco can be harmful to your health and can affect almost every organ in your body. Smoking puts you, and those around you, at risk for developing many serious chronic diseases. Quitting smoking is difficult, but it is one of   the best things that you can do for your health. It is never too late to quit. WHAT ARE THE BENEFITS OF QUITTING SMOKING? When you quit smoking, you lower your risk of developing serious diseases and conditions, such as:  Lung cancer or lung disease, such as COPD.  Heart disease.  Stroke.  Heart attack.  Infertility.  Osteoporosis and bone fractures. Additionally, symptoms such as coughing, wheezing, and shortness of breath may get better when you quit. You may also find that you get sick less often because your body is stronger at fighting off colds and infections. If you are pregnant, quitting smoking can help to reduce your chances of having a baby of low birth weight. HOW DO I GET READY TO QUIT? When you decide to quit smoking, create a plan to  make sure that you are successful. Before you quit:  Pick a date to quit. Set a date within the next two weeks to give you time to prepare.  Write down the reasons why you are quitting. Keep this list in places where you will see it often, such as on your bathroom mirror or in your car or wallet.  Identify the people, places, things, and activities that make you want to smoke (triggers) and avoid them. Make sure to take these actions:  Throw away all cigarettes at home, at work, and in your car.  Throw away smoking accessories, such as ashtrays and lighters.  Clean your car and make sure to empty the ashtray.  Clean your home, including curtains and carpets.  Tell your family, friends, and coworkers that you are quitting. Support from your loved ones can make quitting easier.  Talk with your health care provider about your options for quitting smoking.  Find out what treatment options are covered by your health insurance. WHAT STRATEGIES CAN I USE TO QUIT SMOKING?  Talk with your healthcare provider about different strategies to quit smoking. Some strategies include:  Quitting smoking altogether instead of gradually lessening how much you smoke over a period of time. Research shows that quitting "cold turkey" is more successful than gradually quitting.  Attending in-person counseling to help you build problem-solving skills. You are more likely to have success in quitting if you attend several counseling sessions. Even short sessions of 10 minutes can be effective.  Finding resources and support systems that can help you to quit smoking and remain smoke-free after you quit. These resources are most helpful when you use them often. They can include:  Online chats with a counselor.  Telephone quitlines.  Printed self-help materials.  Support groups or group counseling.  Text messaging programs.  Mobile phone applications.  Taking medicines to help you quit smoking. (If you are  pregnant or breastfeeding, talk with your health care provider first.) Some medicines contain nicotine and some do not. Both types of medicines help with cravings, but the medicines that include nicotine help to relieve withdrawal symptoms. Your health care provider may recommend:  Nicotine patches, gum, or lozenges.  Nicotine inhalers or sprays.  Non-nicotine medicine that is taken by mouth. Talk with your health care provider about combining strategies, such as taking medicines while you are also receiving in-person counseling. Using these two strategies together makes you more likely to succeed in quitting than if you used either strategy on its own. If you are pregnant or breastfeeding, talk with your health care provider about finding counseling or other support strategies to quit smoking. Do not take medicine to help you   quit smoking unless told to do so by your health care provider. WHAT THINGS CAN I DO TO MAKE IT EASIER TO QUIT? Quitting smoking might feel overwhelming at first, but there is a lot that you can do to make it easier. Take these important actions:  Reach out to your family and friends and ask that they support and encourage you during this time. Call telephone quitlines, reach out to support groups, or work with a counselor for support.  Ask people who smoke to avoid smoking around you.  Avoid places that trigger you to smoke, such as bars, parties, or smoke-break areas at work.  Spend time around people who do not smoke.  Lessen stress in your life, because stress can be a smoking trigger for some people. To lessen stress, try:  Exercising regularly.  Deep-breathing exercises.  Yoga.  Meditating.  Performing a body scan. This involves closing your eyes, scanning your body from head to toe, and noticing which parts of your body are particularly tense. Purposefully relax the muscles in those areas.  Download or purchase mobile phone or tablet apps (applications)  that can help you stick to your quit plan by providing reminders, tips, and encouragement. There are many free apps, such as QuitGuide from the CDC (Centers for Disease Control and Prevention). You can find other support for quitting smoking (smoking cessation) through smokefree.gov and other websites. HOW WILL I FEEL WHEN I QUIT SMOKING? Within the first 24 hours of quitting smoking, you may start to feel some withdrawal symptoms. These symptoms are usually most noticeable 2-3 days after quitting, but they usually do not last beyond 2-3 weeks. Changes or symptoms that you might experience include:  Mood swings.  Restlessness, anxiety, or irritation.  Difficulty concentrating.  Dizziness.  Strong cravings for sugary foods in addition to nicotine.  Mild weight gain.  Constipation.  Nausea.  Coughing or a sore throat.  Changes in how your medicines work in your body.  A depressed mood.  Difficulty sleeping (insomnia). After the first 2-3 weeks of quitting, you may start to notice more positive results, such as:  Improved sense of smell and taste.  Decreased coughing and sore throat.  Slower heart rate.  Lower blood pressure.  Clearer skin.  The ability to breathe more easily.  Fewer sick days. Quitting smoking is very challenging for most people. Do not get discouraged if you are not successful the first time. Some people need to make many attempts to quit before they achieve long-term success. Do your best to stick to your quit plan, and talk with your health care provider if you have any questions or concerns.   This information is not intended to replace advice given to you by your health care provider. Make sure you discuss any questions you have with your health care provider.   Document Released: 03/27/2001 Document Revised: 08/17/2014 Document Reviewed: 08/17/2014 Elsevier Interactive Patient Education 2016 Elsevier Inc.  

## 2015-11-10 NOTE — Progress Notes (Signed)
    Postoperative Visit   History of Present Illness  Edward [name] is a 45 y.o. 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  Pt returns today after a call from pt's mother yesterday.  Reported the pt. fell on the right AKA stump on Monday, 11/07/15.  Reported he landed on the incision.  Stated yesterday there was "redness of the incision, and a pus-type drainage on the right side of the stump incision."  Stated the staples are intact. Denied fever/ chills.  Reported the pt. Had an appt yesterday morning but was unable to keep the appt., due to a court date.  Pt is states he is having continued significant pain of his right AKA stump.  He has a 4 weeks follow up appointment scheduled with Dr. Darrick Penna on 11/17/15.  He has a December 21, 2015 appointment with pain management.   The patient's right AKA incision is healing, staples in place.    For VQI Use Only  PRE-ADM LIVING: Home  AMB STATUS: Ambulatory with walker  Physical Examination  Vitals:   11/10/15 1357 11/10/15 1402 11/10/15 1403  BP: (!) 143/91 (!) 144/96 (!) 144/96  Pulse: (!) 130 (!) 131   Resp: 18    Temp: 97.2 F (36.2 C)    Weight: 110 lb (49.9 kg)    Height: 5\' 6"  (1.676 m)     Body mass index is 17.75 kg/m.  Right LE: Incision with staples in place; skin reaction to a few of the staples with mild erythema, minimal swelling after he fell on his right AKA stump on 11/07/15.  Medical Decision Making  Edward [name] is a 45 y.o. 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 Controlled substance data base inquiry was run. Pt is also receiving oxycodone from a provider in [place]. The last controlled substance he received from a pharmacy was oxycodone 10 mg, Qty.30, on 11/01/15.  The patient's stump is healing appropriately with resolution of pre-operative symptoms.  Dr. Darrick Penna spoke with pt and his mother and examined pt.  Dr. Darrick Penna suggested it  is appropriate to prescribe 10 mg oxycodone, 1 tablet po every 4-6 hours prn pain, disp #30, 0 refills, and he must attend his appointment for pain management.  Continue daily soap and warm water cleansing of right AKA incision, pat dry with clean cloth.   The patient was counseled re smoking cessation and given several free resources re smoking cessation.  Follow up with Dr. Darrick Penna as already scheduled on 11/17/15.   Sahithi Ordoyne, Carma Lair, RN, MSN, FNP-C [facility] Office: [phone]  11/10/2015, 2:19 PM  Clinic MD: Darrick Penna

## 2015-11-11 ENCOUNTER — Encounter: Payer: Self-pay | Admitting: Vascular Surgery

## 2015-11-17 ENCOUNTER — Encounter: Payer: Self-pay | Admitting: Vascular Surgery

## 2015-11-17 ENCOUNTER — Ambulatory Visit (INDEPENDENT_AMBULATORY_CARE_PROVIDER_SITE_OTHER): Payer: Self-pay | Admitting: Vascular Surgery

## 2015-11-17 VITALS — BP 134/60 | HR 129 | Temp 97.5°F | Resp 16 | Ht 66.0 in | Wt 105.0 lb

## 2015-11-17 DIAGNOSIS — I739 Peripheral vascular disease, unspecified: Secondary | ICD-10-CM

## 2015-11-17 MED ORDER — OXYCODONE-ACETAMINOPHEN 5-325 MG PO TABS: 1.0000 | ORAL_TABLET | ORAL | 0 refills | Status: DC | PRN

## 2015-11-17 NOTE — Progress Notes (Signed)
Patient is a 45 year old male who is 3 weeks status post right above-knee amputation. He denies any drainage from the incision. Overall he is doing well. He is still requiring some narcotic pain medication but his needs are much less than previous.  Physical exam:  Vitals:   11/17/15 1511  BP: 134/60  Pulse: (!) 129  Resp: 16  Temp: 97.5 F (36.4 C)  TempSrc: Oral  SpO2: 99%  Weight: 105 lb (47.6 kg)  Height: 5\' 6"  (1.676 m)    Extremities: Well healed right above-knee amputation staples removed today  Assessment: Healed right above-knee amputation  Plan: Patient given one last prescription for Percocet 5/325 #30 dispensed discussed with him tapering this off and switching over and transitioning to either extremity strength Tylenol or perhaps ibuprofen. He was also given a prescription today for shrinker and evaluation for a prosthetic limb on the right leg. Otherwise he'll follow-up on as-needed basis.  Fabienne Bruns, MD [facility] Office: [phone] Pager: [phone]

## 2015-11-28 ENCOUNTER — Telehealth: Payer: Self-pay | Admitting: Cardiovascular Disease

## 2015-11-28 NOTE — Telephone Encounter (Signed)
Patient wants to know who read his recent stress test.  Also wants to know who did his stent at [facility] in 2012. Please call to discuss questions.

## 2015-11-28 NOTE — Telephone Encounter (Signed)
Returned pt call. No answer, no VM set up. Will call again.

## 2015-11-28 NOTE — Telephone Encounter (Signed)
Patient wants to know md that read recent stress test.  [facility].  Please call to discuss questions.

## 2015-11-28 NOTE — Telephone Encounter (Signed)
S/w pt who states he needs name of MD who put in stent in 2012 as he is applying for disability. His stent card has Dr. Glennis Brinkallwood's name but he is unsure if that is who performed procedure. [facility] and was told to call our office. Advised pt I will need to review and try and locate information but states he will list Dr. Juliann Paresallwood. He also inquires who read February 2017 myoview. Advised pt Dr. Kirke CorinArida noted "Low risk nuclear stress test. The patient is at low risk for surgery." Pt verbalized understanding and is appreciative of the help.

## 2015-12-09 DIAGNOSIS — Z0279 Encounter for issue of other medical certificate: Secondary | ICD-10-CM | POA: Diagnosis not present

## 2015-12-09 LAB — ACID FAST CULTURE WITH REFLEXED SENSITIVITIES (MYCOBACTERIA): Acid Fast Culture: NEGATIVE

## 2015-12-13 NOTE — Progress Notes (Signed)
Cardiology Office Note   Date:  12/14/2015   ID:  Edward [name], DOB 10/17/1970, MRN [id number]  PCP:  Pershing Proud  Cardiologist:   Charlton Haws, MD   No chief complaint on file.     History of Present Illness: Edward [name] is a 45 y.o. 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  He had had a previous right fem pop bypass which became occluded Has been on xarelto for history of PE about a year ago .  Noted some sinus tachycardia due to pain in post op period.    He has previously been seen by Dr Mariah Milling for CAD.  Presented to [facility] September 28 2010 with chest pain. Troponin was elevated to 0.78 with repeat troponin greater than 2. He was taken to the cardiac catheterization lab where he was found to have a severely stenotic 80% proximal ramus disease, 50% mid LAD disease. He had a bare-metal stent placed in his ramus vessel.   Then seen by Dr Kirke Corin 05/26/15 for preop clearance before his initial BKA by Dr Marylen Ponto was ordered:  Non ischemic with EF 55-65%   Has had some post op anemia Hct 34.9   Past Medical History:  Diagnosis Date  . Arthritis    "hands" (10/17/2015)  . Collagen vascular disease (HCC)   . Coronary artery disease   . Depression   . Dizziness 07-16-2015  . DVT (deep venous thrombosis) (HCC)    right leg-takes Xarelto daily bridging with Lovenox for surgery  . Emphysema   . Emphysema lung (HCC)   . Hematoma hospitalized 10/25/2015   diffuse circumferential edema right BKA tender to palpation  . History of gout    not on any meds  . Myocardial infarction (HCC) 2012  . Peripheral neuropathy (HCC)    takes Gabapentin daily  . Peripheral vascular disease (HCC)   . Pneumothorax 2006   s/p chest tube placement x 2   . Pulmonary embolism (HCC) 2016  . Shortness of breath dyspnea    with exertion  . Vertigo    rarely but doesn't take any meds     Past Surgical History:  Procedure Laterality Date  . AMPUTATION Right 09/27/2015   Procedure: AMPUTATION BELOW KNEE;  Surgeon: Sherren Kerns, MD;  Location: [facility] OR;  Service: Vascular;  Laterality: Right;  . AMPUTATION Right 10/28/2015   Procedure: AMPUTATION ABOVE KNEE;  Surgeon: Sherren Kerns, MD;  Location: [facility] OR;  Service: Vascular;  Laterality: Right;  . CARDIAC CATHETERIZATION  2012   s/p stent placement  . CHEST TUBE INSERTION    . CORONARY ANGIOPLASTY WITH STENT PLACEMENT     1 stent  . FEMORAL-POPLITEAL BYPASS GRAFT Right 05/30/2015   Procedure: RIGHT SUPERFICIAL FEMORAL TO BELOW KNEE POPLITEAL ARTERY BYPASS GRAFT;  Surgeon: Sherren Kerns, MD;  Location: [facility] OR;  Service: Vascular;  Laterality: Right;  . FEMORAL-POPLITEAL BYPASS GRAFT Right 05/30/2015   Procedure: RIGHT BYPASS GRAFT FEMORAL-POPLITEAL ARTERY USING PROPATEN 62mmx80cm  GRAFT; WITH VEIN CUFF;  Surgeon: Sherren Kerns, MD;  Location: [facility] OR;  Service: Vascular;  Laterality: Right;  . FEMORAL-POPLITEAL BYPASS GRAFT Right 07/04/2015   Procedure: REDO RIGHT FEMORAL TO BELOW KNEE POPLITEAL BYPASS GRAFT USING CONTRALATERAL  REVERSED GREATER SAPHENOUS VEIN;  Surgeon: Sherren Kerns, MD;  Location: [facility] OR;  Service: Vascular;  Laterality: Right;  . I&D EXTREMITY Right  10/26/2015   Procedure: IRRIGATION AND DEBRIDEMENT BELOW KNEE AMPUTATION;  Surgeon: Sherren Kerns, MD;  Location: [facility] OR;  Service: Vascular;  Laterality: Right;  . INGUINAL HERNIA REPAIR Right ~ 2012  . INTRAOPERATIVE ARTERIOGRAM Right 05/30/2015   Procedure: INTRA OPERATIVE ARTERIOGRAM;  Surgeon: Sherren Kerns, MD;  Location: [facility] OR;  Service: Vascular;  Laterality: Right;  . INTRAOPERATIVE ARTERIOGRAM Right 05/30/2015   Procedure: INTRA OPERATIVE ARTERIOGRAM;  Surgeon: Sherren Kerns, MD;  Location: [facility] OR;  Service: Vascular;  Laterality: Right;  . INTRAOPERATIVE ARTERIOGRAM  07/04/2015   Procedure: INTRA OPERATIVE ARTERIOGRAM LEFT LOWER EXTREMITY;   Surgeon: Sherren Kerns, MD;  Location: [facility] OR;  Service: Vascular;;  . PERIPHERAL VASCULAR CATHETERIZATION N/A 05/18/2015   Procedure: Abdominal Aortogram;  Surgeon: Nada Libman, MD;  Location: [facility] INVASIVE CV LAB;  Service: Cardiovascular;  Laterality: N/A;  . THROMBECTOMY FEMORAL ARTERY Left 07/04/2015   Procedure: THROMBECTOMY LEFT  TIBIAL ARTERY;  Surgeon: Sherren Kerns, MD;  Location: [facility] OR;  Service: Vascular;  Laterality: Left;  Marland Kitchen VEIN HARVEST Right 05/30/2015   Procedure: RIGHT GREAT SAPHENOUS VEIN HARVEST;  Surgeon: Sherren Kerns, MD;  Location: [facility] OR;  Service: Vascular;  Laterality: Right;  . VEIN HARVEST Left 07/04/2015   Procedure: VEIN HARVEST LEFT GREATER SAPHENOUS ;  Surgeon: Sherren Kerns, MD;  Location: [facility] OR;  Service: Vascular;  Laterality: Left;     Current Outpatient Prescriptions  Medication Sig Dispense Refill  . DULoxetine (CYMBALTA) 60 MG capsule Take 60 mg by mouth daily.    Marland Kitchen gabapentin (NEURONTIN) 300 MG capsule Take 300 mg by mouth 3 (three) times daily.    Marland Kitchen levETIRAcetam (KEPPRA) 250 MG tablet Take 250 mg by mouth daily.     . nitroGLYCERIN (NITROSTAT) 0.4 MG SL tablet Place 0.4 mg under the tongue every 5 (five) minutes as needed for chest pain.     Marland Kitchen oxyCODONE-acetaminophen (PERCOCET/ROXICET) 5-325 MG tablet Take 1 tablet by mouth every 4 (four) hours as needed for severe pain. 30 tablet 0  . rivaroxaban (XARELTO) 20 MG TABS tablet Take 20 mg by mouth daily with lunch.      No current facility-administered medications for this visit.     Allergies:   Keflex [cephalexin] and Lisinopril    Social History:  The patient  reports that he has been smoking Cigarettes.  He has a 25.00 pack-year smoking history. He has never used smokeless tobacco. He reports that he drinks about 3.6 oz of alcohol per week . He reports that he uses drugs, including Marijuana and Cocaine.   Family History:  The patient's family history is not on file.    ROS:  Please  see the history of present illness.   Otherwise, review of systems are positive for none.   All other systems are reviewed and negative.    PHYSICAL EXAM: VS:  BP 112/80   Pulse (!) 107   Ht 5\' 5"  (1.651 m)   Wt 112 lb (50.8 kg)   SpO2 98%   BMI 18.64 kg/m  , BMI Body mass index is 18.64 kg/m. Affect appropriate Healthy:  appears stated age HEENT: normal Neck supple with no adenopathy JVP normal no bruits no thyromegaly Lungs clear with no wheezing and good diaphragmatic motion Heart:  S1/S2 no murmur, no rub, gallop or click PMI normal Abdomen: benighn, BS positve, no tenderness, no AAA no bruit.  No HSM or HJR Right Above knee amputation palpable PT on left old  right fempop occluded  No edema Neuro non-focal Skin warm and dry No muscular weakness    EKG:   10/26/15  ST rate 117 othewise normal    Recent Labs: 10/25/2015: ALT 13 10/30/2015: BUN 6; Creatinine, Ser 0.61; Hemoglobin 10.9; Platelets 314; Potassium 4.4; Sodium 136    Lipid Panel No results found for: CHOL, TRIG, HDL, CHOLHDL, VLDL, LDLCALC, LDLDIRECT    Wt Readings from Last 3 Encounters:  12/14/15 112 lb (50.8 kg)  11/17/15 105 lb (47.6 kg)  11/10/15 110 lb (49.9 kg)      Other studies Reviewed: Additional studies/ records that were reviewed today include: Notes Dr Mariah MillingGollan and Dr Kirke CorinArida as well as numerous PV notes DR Early and Fields Along with CT, myovue and ECG;s .    ASSESSMENT AND PLAN:  1.  Tacycardia: etiology not clear not symptomatic repeat TSH/T4 and Hct. Start Toprol 50 mg EF has Been normal with no angina or chest pain f/u [facility] 6-8 weeks to see how he does on beta blocker May be sequelae from infection still Not arrhythmia mechanism is sinus 2. PVD:  Stump seems healed done with antibiotics and PT had casting for prosthetic device done yesterday 3. PE:  On xarelto with no bleeding issues   Current medicines are reviewed at length with the patient today.  The patient  does not have concerns regarding medicines.  The following changes have been made:  Toprol 50 mg   Labs/ tests ordered today include: Hb/Hct TSH/T4 No orders of the defined types were placed in this encounter.    Disposition:   FU with  clinic 6-8 weeks      Signed, Charlton HawsPeter Daisha Filosa, MD  12/14/2015 11:10 AM    [facility] [address], [place], [place]  [address] Phone: [phone]; Fax: [phone]

## 2015-12-14 ENCOUNTER — Encounter: Payer: Self-pay | Admitting: Cardiovascular Disease

## 2015-12-14 ENCOUNTER — Ambulatory Visit (INDEPENDENT_AMBULATORY_CARE_PROVIDER_SITE_OTHER): Payer: No Typology Code available for payment source | Admitting: Cardiovascular Disease

## 2015-12-14 VITALS — BP 112/80 | HR 107 | Ht 65.0 in | Wt 112.0 lb

## 2015-12-14 DIAGNOSIS — R000 Tachycardia, unspecified: Secondary | ICD-10-CM | POA: Diagnosis not present

## 2015-12-14 LAB — HEMATOCRIT: HCT: 44.6 % (ref 38.5–50.0)

## 2015-12-14 LAB — TSH: TSH: 2.38 mIU/L (ref 0.40–4.50)

## 2015-12-14 LAB — HEMOGLOBIN: Hemoglobin: 15.2 g/dL (ref 13.2–17.1)

## 2015-12-14 MED ORDER — METOPROLOL SUCCINATE ER 50 MG PO TB24: 50.0000 mg | ORAL_TABLET | Freq: Every day | ORAL | 3 refills | Status: DC

## 2015-12-14 NOTE — Patient Instructions (Signed)
Your physician recommends that you schedule a follow-up appointment in: 4-6 Weeks  Your physician has recommended you make the following change in your medication: Start Toprol 50 mg Daily   Your physician recommends that you return for lab work in: Today  If you need a refill on your cardiac medications before your next appointment, please call your pharmacy.  Thank you for choosing [facility]!

## 2015-12-15 LAB — T4: T4, Total: 8.4 ug/dL (ref 4.5–12.0)

## 2015-12-20 ENCOUNTER — Telehealth: Payer: Self-pay | Admitting: Cardiovascular Disease

## 2015-12-21 NOTE — Telephone Encounter (Signed)
Normal labs,had mailed letter as pt hs no vm set up.attempt to reach again after pt called, no vm,will mail letter that labs are normal

## 2016-01-23 NOTE — Progress Notes (Signed)
Cardiology Office Note   Date:  01/26/2016   ID:  Edward [name], DOB 07-15-1970, MRN [id number]  PCP:  Pershing ProudJACKSON,SAMANTHA, PA-C  Cardiologist:   Charlton HawsPeter Elyjah Hazan, MD   No chief complaint on file.     History of Present Illness: Edward [name] is a 45 y.o. 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  He had had a previous right fem pop bypass which became occluded Has been on xarelto for history of PE about a year ago .  Noted some sinus tachycardia due to pain in post op period.    He has previously been seen by Dr Mariah MillingGollan for CAD.  [facility] 2010 with chest pain. Troponin was elevated to 0.78 with repeat troponin greater than 2. He was taken to the cardiac catheterization lab where he was found to have a severely stenotic 80% proximal ramus disease, 50% mid LAD disease. He had a bare-metal stent placed in his ramus vessel.   Then seen by Dr Kirke CorinArida 05/26/15 for preop clearance before his initial BKA by Dr Marylen PontoFields Myovue was ordered:  Non ischemic with EF 55-65%   Has had some post op anemia Hct 34.9 but improved last f/u 44.6   And TSH normal on 8/30   Past Medical History:  Diagnosis Date  . Arthritis    "hands" (10/17/2015)  . Collagen vascular disease (HCC)   . Coronary artery disease   . Depression   . Dizziness 07-16-2015  . DVT (deep venous thrombosis) (HCC)    right leg-takes Xarelto daily bridging with Lovenox for surgery  . Emphysema   . Emphysema lung (HCC)   . Hematoma hospitalized 10/25/2015   diffuse circumferential edema right BKA tender to palpation  . History of gout    not on any meds  . Myocardial infarction 2012  . Peripheral neuropathy (HCC)    takes Gabapentin daily  . Peripheral vascular disease (HCC)   . Pneumothorax 2006   s/p chest tube placement x 2   . Pulmonary embolism (HCC) 2016  . Shortness of breath dyspnea    with exertion  .  Vertigo    rarely but doesn't take any meds    Past Surgical History:  Procedure Laterality Date  . AMPUTATION Right 09/27/2015   Procedure: AMPUTATION BELOW KNEE;  Surgeon: Sherren Kernsharles E Fields, MD;  [facility];  Service: Vascular;  Laterality: Right;  . AMPUTATION Right 10/28/2015   Procedure: AMPUTATION ABOVE KNEE;  Surgeon: Sherren Kernsharles E Fields, MD;  [facility];  Service: Vascular;  Laterality: Right;  . CARDIAC CATHETERIZATION  2012   s/p stent placement  . CHEST TUBE INSERTION    . CORONARY ANGIOPLASTY WITH STENT PLACEMENT     1 stent  . FEMORAL-POPLITEAL BYPASS GRAFT Right 05/30/2015   Procedure: RIGHT SUPERFICIAL FEMORAL TO BELOW KNEE POPLITEAL ARTERY BYPASS GRAFT;  Surgeon: Sherren Kernsharles E Fields, MD;  [facility];  Service: Vascular;  Laterality: Right;  . FEMORAL-POPLITEAL BYPASS GRAFT Right 05/30/2015   Procedure: RIGHT BYPASS GRAFT FEMORAL-POPLITEAL ARTERY USING PROPATEN 236mmx80cm  GRAFT; WITH VEIN CUFF;  Surgeon: Sherren Kernsharles E Fields, MD;  [facility];  Service: Vascular;  Laterality: Right;  . FEMORAL-POPLITEAL BYPASS GRAFT Right 07/04/2015   Procedure: REDO RIGHT FEMORAL TO BELOW KNEE POPLITEAL BYPASS GRAFT USING CONTRALATERAL  REVERSED GREATER SAPHENOUS VEIN;  Surgeon: Sherren Kernsharles E Fields, MD;  [facility]  OR;  Service: Vascular;  Laterality: Right;  . I&D EXTREMITY Right 10/26/2015   Procedure: IRRIGATION AND DEBRIDEMENT BELOW KNEE AMPUTATION;  Surgeon: Sherren Kerns, MD;  Location: [facility] OR;  Service: Vascular;  Laterality: Right;  . INGUINAL HERNIA REPAIR Right ~ 2012  . INTRAOPERATIVE ARTERIOGRAM Right 05/30/2015   Procedure: INTRA OPERATIVE ARTERIOGRAM;  Surgeon: Sherren Kerns, MD;  Location: [facility] OR;  Service: Vascular;  Laterality: Right;  . INTRAOPERATIVE ARTERIOGRAM Right 05/30/2015   Procedure: INTRA OPERATIVE ARTERIOGRAM;  Surgeon: Sherren Kerns, MD;  Location: [facility] OR;  Service: Vascular;  Laterality: Right;  . INTRAOPERATIVE ARTERIOGRAM  07/04/2015   Procedure: INTRA  OPERATIVE ARTERIOGRAM LEFT LOWER EXTREMITY;  Surgeon: Sherren Kerns, MD;  Location: [facility] OR;  Service: Vascular;;  . PERIPHERAL VASCULAR CATHETERIZATION N/A 05/18/2015   Procedure: Abdominal Aortogram;  Surgeon: Nada Libman, MD;  [facility] INV[facility] CV LAB;  Service: Cardiovascular;  Laterality: N/A;  . THROMBECTOMY FEMORAL ARTERY Left 07/04/2015   Procedure: THROMBECTOMY LEFT  TIBIAL ARTERY;  Surgeon: Sherren Kerns, MD;  Location: [facility] OR;  Service: Vascular;  Laterality: Left;  Marland Kitchen VEIN HARVEST Right 05/30/2015   Procedure: RIGHT GREAT SAPHENOUS VEIN HARVEST;  Surgeon: Sherren Kerns, MD;  Location: [facility] OR;  Service: Vascular;  Laterality: Right;  . VEIN HARVEST Left 07/04/2015   Procedure: VEIN HARVEST LEFT GREATER SAPHENOUS ;  Surgeon: Sherren Kerns, MD;  Location: [facility] OR;  Service: Vascular;  Laterality: Left;     Current Outpatient Prescriptions  Medication Sig Dispense Refill  . DULoxetine (CYMBALTA) 60 MG capsule Take 60 mg by mouth daily.    Marland Kitchen gabapentin (NEURONTIN) 300 MG capsule Take 300 mg by mouth 3 (three) times daily.    Marland Kitchen levETIRAcetam (KEPPRA) 250 MG tablet Take 250 mg by mouth daily.     . metoprolol succinate (TOPROL-XL) 50 MG 24 hr tablet Take 1 tablet (50 mg total) by mouth daily. 90 tablet 3  . nitroGLYCERIN (NITROSTAT) 0.4 MG SL tablet Place 0.4 mg under the tongue every 5 (five) minutes as needed for chest pain.     Marland Kitchen oxyCODONE-acetaminophen (PERCOCET/ROXICET) 5-325 MG tablet Take 1 tablet by mouth every 4 (four) hours as needed for severe pain. 30 tablet 0  . rivaroxaban (XARELTO) 20 MG TABS tablet Take 20 mg by mouth daily with lunch.      No current facility-administered medications for this visit.     Allergies:   Keflex [cephalexin] and Lisinopril    Social History:  The patient  reports that he has been smoking Cigarettes.  He started smoking about 30 years ago. He has a 25.00 pack-year smoking history. He has never used smokeless tobacco. He reports that  he drinks about 3.6 oz of alcohol per week . He reports that he uses drugs, including Marijuana and Cocaine.   Family History:  The patient's family history is not on file.    ROS:  Please see the history of present illness.   Otherwise, review of systems are positive for none.   All other systems are reviewed and negative.    PHYSICAL EXAM: VS:  BP 104/64   Pulse 91   Ht 5\' 6"  (1.676 m)   Wt 50.8 kg (112 lb)   SpO2 97%   BMI 18.08 kg/m  , BMI Body mass index is 18.08 kg/m. Affect appropriate Healthy:  appears stated age HEENT: normal Neck supple with no adenopathy JVP normal no bruits no thyromegaly Lungs clear with no wheezing  and good diaphragmatic motion Heart:  S1/S2 no murmur, no rub, gallop or click PMI normal Abdomen: benighn, BS positve, no tenderness, no AAA no bruit.  No HSM or HJR Right Above knee amputation palpable PT on left old right fempop occluded  No edema Neuro non-focal Skin warm and dry No muscular weakness    EKG:   10/26/15  ST rate 117 othewise normal    Recent Labs: 10/25/2015: ALT 13 10/30/2015: BUN 6; Creatinine, Ser 0.61; Platelets 314; Potassium 4.4; Sodium 136 12/14/2015: Hemoglobin 15.2; TSH 2.38    Lipid Panel No results found for: CHOL, TRIG, HDL, CHOLHDL, VLDL, LDLCALC, LDLDIRECT    Wt Readings from Last 3 Encounters:  01/26/16 50.8 kg (112 lb)  12/14/15 50.8 kg (112 lb)  11/17/15 47.6 kg (105 lb)      Other studies Reviewed: Additional studies/ records that were reviewed today include: Notes Dr Mariah Milling and Dr Kirke Corin as well as numerous PV notes DR Early and Fields Along with CT, myovue and ECG;s .    ASSESSMENT AND PLAN:  1.  Tacycardia: Started on beta blocker in August improved did not take this am  2. PVD:  Stump seems healed done with antibiotics and PT had casting for prosthetic device per primary  3. PE:  On xarelto with no bleeding issues Been been 16 months and per guidelines could consider stopping Told him to  discuss with primary Terie Purser at [facility] 4. COPD: discussed smoking cessation yearly ECG    Current medicines are reviewed at length with the patient today.  The patient does not have concerns regarding medicines.  The following changes have been made:  Toprol 50 mg    Disposition:   FU with [facility] a year     Signed, Charlton Haws, MD  01/26/2016 9:45 AM    [facility] [address] [place], [place], [place]  [address] Phone: [phone]; Fax: [phone]

## 2016-01-26 ENCOUNTER — Encounter: Payer: Self-pay | Admitting: Cardiovascular Disease

## 2016-01-26 ENCOUNTER — Ambulatory Visit (INDEPENDENT_AMBULATORY_CARE_PROVIDER_SITE_OTHER): Payer: Self-pay | Admitting: Cardiovascular Disease

## 2016-01-26 VITALS — BP 104/64 | HR 91 | Ht 66.0 in | Wt 112.0 lb

## 2016-01-26 DIAGNOSIS — I2510 Atherosclerotic heart disease of native coronary artery without angina pectoris: Secondary | ICD-10-CM

## 2016-01-26 NOTE — Patient Instructions (Signed)

## 2017-05-13 IMAGING — CT CT ANGIO AOBIFEM WO/W CM
1 of 6 series · 7 of 16 positions shown, 9 images · IV contrast (omnipaque)
Comparison: None.

CLINICAL DATA: Acute onset of right leg claudication. Assess for
patency of right femoral-popliteal bypass graft. Initial encounter.

EXAM:
CT ANGIOGRAPHY OF ABDOMINAL AORTA WITH ILIOFEMORAL RUNOFF
TECHNIQUE: Multidetector CT imaging of the abdomen, pelvis and lower
extremities was performed using the standard protocol during bolus
administration of intravenous contrast. Multiplanar CT image
reconstructions and MIPs were obtained to evaluate the vascular
anatomy.
CONTRAST:  100mL OMNIPAQUE IOHEXOL 350 MG/ML SOLN

[Series 4: cta runoff (id) · axial · 0.83mm/px · z∈[+195,+1191]mm · 7 of 444 slices shown, 9 images]
[im 56/444  soft-tissue]
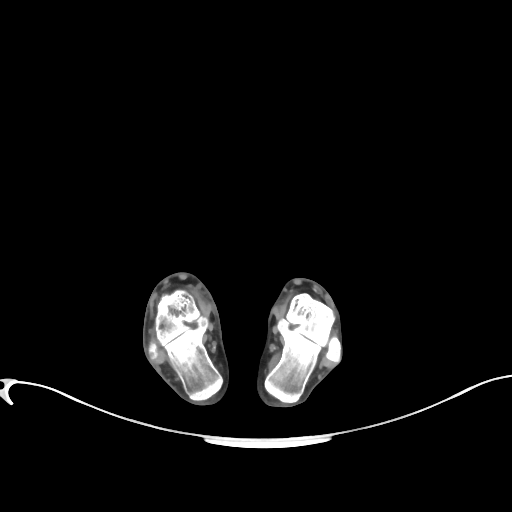
[im 56/444  bone]
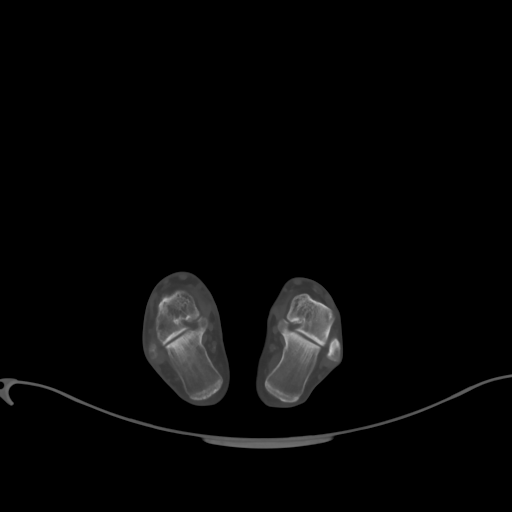
[im 111/444  soft-tissue]
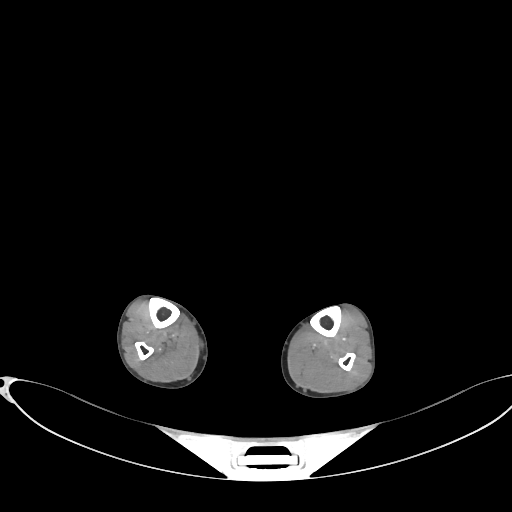
[im 167/444  soft-tissue]
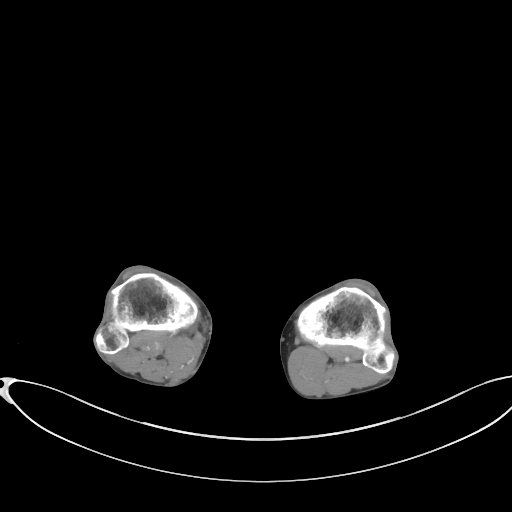
[im 222/444  soft-tissue]
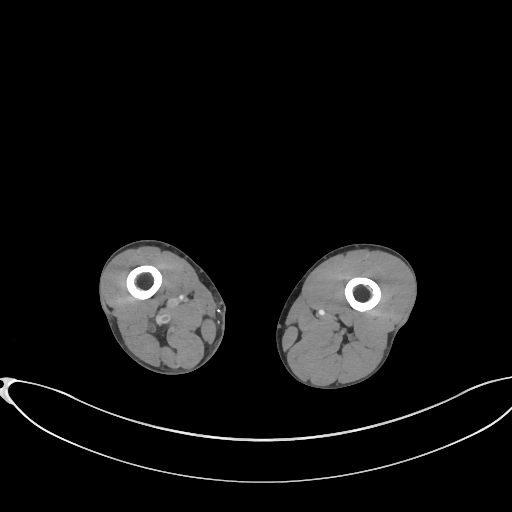
[im 277/444  soft-tissue]
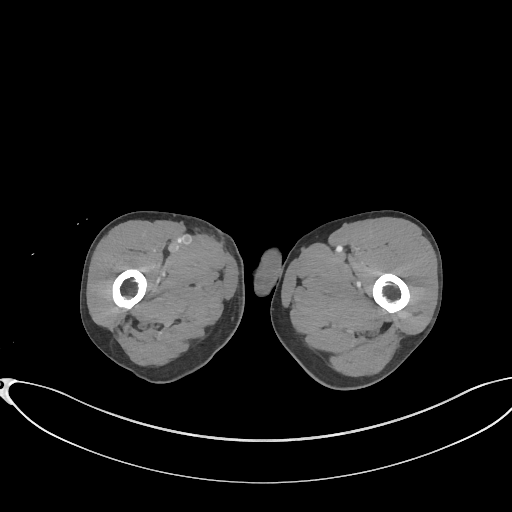
[im 277/444  bone]
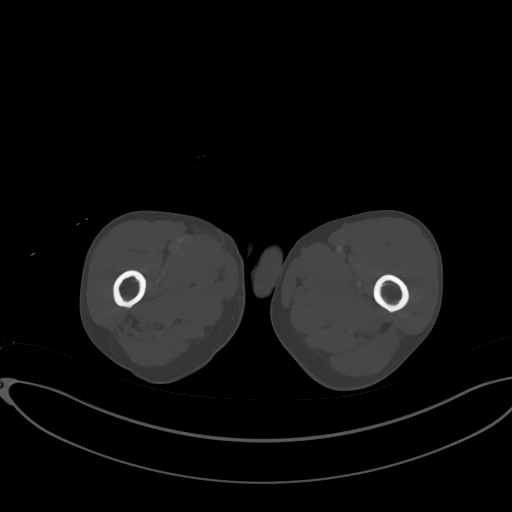
[im 333/444  soft-tissue]
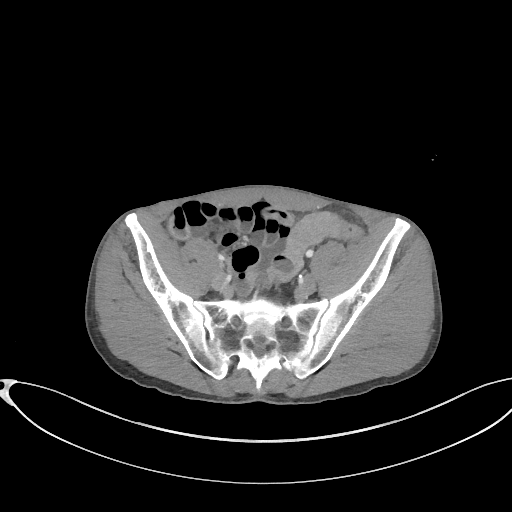
[im 388/444  soft-tissue]
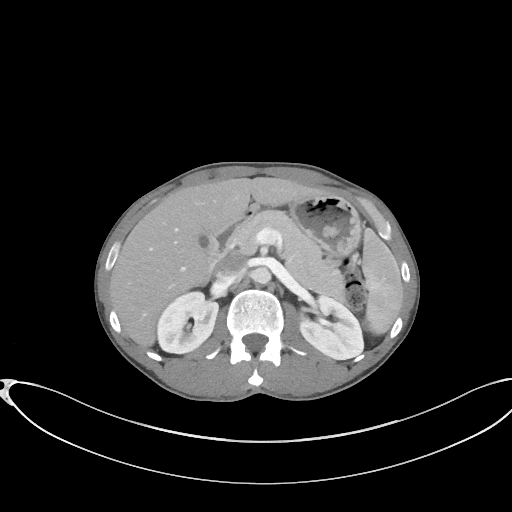

[7 of 16 positions shown; findings below may reference images not displayed]

FINDINGS: Aorta: There is mild mural thrombus along the course of the
abdominal aorta, particularly at the distal abdominal aorta just
proximal to the aortic bifurcation. Mild scattered calcification is
noted along the abdominal aorta.

The celiac trunk, superior mesenteric artery, bilateral renal
arteries and inferior mesenteric artery remain fully patent. There
is minimal mural thrombus along the common iliac arteries
bilaterally.

Right Lower Extremity: The right common femoral artery remains
patent. The profunda femoris is unremarkable in appearance. A
femoral-popliteal bypass graft is noted arising at the proximal
superficial femoral artery. The bypass graft appears fully occluded.

The native superficial femoral artery remains patent, though it
appears to occlude just above the level of the popliteal artery,
with collateral vessels filling along the medial aspect of the right
knee. Just distal to the occluded bypass graft, there is
reconstitution of the branches of the popliteal artery, with
apparent variant three-vessel runoff to the level of the right
ankle.

Left Lower Extremity: The left common femoral artery is patent. The
left profunda femoris artery is unremarkable. The left superficial
femoral artery remains patent, though mild associated calcification
is seen. The left popliteal artery is unremarkable. The branches of
the popliteal artery are within normal limits, with apparent
three-vessel runoff to the level of the left ankle.

Other Findings:

The visualized lung bases are clear.

The liver and spleen are unremarkable in appearance. The gallbladder
is within normal limits. The pancreas and adrenal glands are
unremarkable.

The kidneys are unremarkable in appearance. There is no evidence of
hydronephrosis. No renal or ureteral stones are seen. No perinephric
stranding is appreciated.

No free fluid is identified. The small bowel is unremarkable in
appearance. The stomach is within normal limits. No acute vascular
abnormalities are seen.

The appendix is normal in caliber, without evidence for
appendicitis. The colon is unremarkable in appearance.

The bladder is mildly distended and grossly unremarkable. The
prostate is enlarged, measuring 5.2 cm in transverse dimension. No
inguinal lymphadenopathy is seen.

No acute osseous abnormalities are identified.

Review of the MIP images confirms the above findings.
IMPRESSION: 1. Occlusion of the right-sided femoral popliteal bypass graft along
its entire course, from the proximal superficial femoral artery to
the popliteal artery.
2. Underlying chronic occlusion of the popliteal artery, with
collaterals filling along the medial aspect of the right knee. There
is reconstitution of the branches of the popliteal artery from
surrounding vessels, with apparent variant three-vessel runoff to
the level of the right ankle.
3. Normal three-vessel runoff to the level of the left ankle.
4. Mild mural thrombus along the course of the abdominal aorta and
minimal mural thrombus along the common iliac arteries bilaterally.
5. Enlarged prostate noted.

These results were called by telephone at the time of interpretation
on 07/01/2015 at [DATE] to Dr. Messin, who verbally acknowledged
these results.

## 2017-11-19 DIAGNOSIS — I739 Peripheral vascular disease, unspecified: Secondary | ICD-10-CM | POA: Diagnosis not present

## 2017-11-19 DIAGNOSIS — R030 Elevated blood-pressure reading, without diagnosis of hypertension: Secondary | ICD-10-CM | POA: Diagnosis not present

## 2017-11-19 DIAGNOSIS — Z681 Body mass index (BMI) 19 or less, adult: Secondary | ICD-10-CM | POA: Diagnosis not present

## 2017-11-19 DIAGNOSIS — G546 Phantom limb syndrome with pain: Secondary | ICD-10-CM | POA: Diagnosis not present

## 2018-02-07 DIAGNOSIS — Z89611 Acquired absence of right leg above knee: Secondary | ICD-10-CM | POA: Diagnosis not present

## 2018-02-07 DIAGNOSIS — Z681 Body mass index (BMI) 19 or less, adult: Secondary | ICD-10-CM | POA: Diagnosis not present

## 2018-02-19 DIAGNOSIS — Z681 Body mass index (BMI) 19 or less, adult: Secondary | ICD-10-CM | POA: Diagnosis not present

## 2018-02-19 DIAGNOSIS — Z0000 Encounter for general adult medical examination without abnormal findings: Secondary | ICD-10-CM | POA: Diagnosis not present

## 2018-02-24 DIAGNOSIS — R030 Elevated blood-pressure reading, without diagnosis of hypertension: Secondary | ICD-10-CM | POA: Diagnosis not present

## 2018-02-24 DIAGNOSIS — I739 Peripheral vascular disease, unspecified: Secondary | ICD-10-CM | POA: Diagnosis not present

## 2018-02-24 DIAGNOSIS — Z681 Body mass index (BMI) 19 or less, adult: Secondary | ICD-10-CM | POA: Diagnosis not present

## 2018-02-24 DIAGNOSIS — G546 Phantom limb syndrome with pain: Secondary | ICD-10-CM | POA: Diagnosis not present

## 2018-02-27 ENCOUNTER — Other Ambulatory Visit: Payer: Self-pay

## 2018-02-27 ENCOUNTER — Ambulatory Visit (INDEPENDENT_AMBULATORY_CARE_PROVIDER_SITE_OTHER): Payer: Medicare Other | Admitting: Vascular Surgery

## 2018-02-27 ENCOUNTER — Encounter: Payer: Self-pay | Admitting: Vascular Surgery

## 2018-02-27 VITALS — BP 118/82 | HR 85 | Temp 97.1°F | Resp 16 | Ht 66.0 in | Wt 116.0 lb

## 2018-02-27 DIAGNOSIS — Z89611 Acquired absence of right leg above knee: Secondary | ICD-10-CM

## 2018-02-27 DIAGNOSIS — Z89619 Acquired absence of unspecified leg above knee: Secondary | ICD-10-CM | POA: Insufficient documentation

## 2018-02-27 DIAGNOSIS — I739 Peripheral vascular disease, unspecified: Secondary | ICD-10-CM

## 2018-02-27 NOTE — Progress Notes (Signed)
    Established PAD Patient   History of Present Illness   Edward RedbirdKevin [name] is a 47 y.o. (07-27-1970) male, height 5\' 5" , and weight of 110 pounds who presents for evaluation of a new right transfemoral prosthesis.  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  He is currently wearing a prosthesis that has a hydraulic knee and an energy storing foot.  Since being fit with prosthesis, and receiving gait training with physical therapy, he has been able to return to a productive and active lifestyle.  He currently ambulates with no assistive devices.  Patient also reports while wearing his current hydraulic knee, he has fallen a couple of times on level ground, and has had to catch himself to prevent additional falls on a regular basis.  He denies any claudication, rest pain, or active tissue ischemia of left lower extremity.  Current Outpatient Medications  Medication Sig Dispense Refill  . nitroGLYCERIN (NITROSTAT) 0.4 MG SL tablet Place 0.4 mg under the tongue every 5 (five) minutes as needed for chest pain.     Marland Kitchen. oxyCODONE-acetaminophen (PERCOCET/ROXICET) 5-325 MG tablet Take 1 tablet by mouth every 4 (four) hours as needed for severe pain. 30 tablet 0   No current facility-administered medications for this visit.     On ROS today: 10 system ROS is negative unless otherwise noted in HPI   Physical Examination   Vitals:   02/27/18 1402  BP: 118/82  Pulse: 85  Resp: 16  Temp: (!) 97.1 F (36.2 C)  TempSrc: Oral  SpO2: 99%  Weight: 116 lb (52.6 kg)  Height: 5\' 6"  (1.676 m)   Body mass index is 18.72 kg/m.  General Alert, O x 3, WD, NAD  Pulmonary Sym exp, good B air movt,   Cardiac RRR, Nl S1, S2,  Vascular Vessel Right Left  Radial Palpable Palpable  Aorta Not palpable N/A  Femoral Palpable Palpable  Popliteal Not palpable Not palpable    Gastro- intestinal soft, non-distended, non-tender to palpation,   Musculo- skeletal A&O  Neurologic     Medical  Decision Making   Edward RedbirdKevin Cerullo is a 47 y.o. male who presents for evaluation of R AKA stump for new R transfemoral prosthesis  Right AKA residual limb without signs of skin irritation or contracture.  Left lower extremity also without skin irritation or any other medical issues to prevent him from ambulating at a K3 level which would allow him to ambulate with variable cadence and on uneven terrain, descend stairs and ramps.  Bilateral upper extremity evaluation reveals the patient has full range of motion and full strength.  I feel Edward [name] is a good candidate for being fit with a new above-the-knee prosthesis with suction socket, micro-processing knee technology, and energy storing foot.  This will allow him to return to a more productive and active lifestyle that he is accustomed to.  Patient is very motivated and likes to stay active.  Once fit with a new prosthesis, I feel the patient will be able to achieve all his goals.  A new prosthesis will also allow patient to ambulate more safely and with more confidence.  I will also recommend more prosthetic gait training with physical therapy to assist him with reaching his full potential as a K3 level prosthetic ambulator using the new prosthesis with a micro-processing technology knee.    Emilie RutterMatthew Keymora Grillot PA-C [facility] [place] Office: [phone]

## 2018-05-16 DIAGNOSIS — Z681 Body mass index (BMI) 19 or less, adult: Secondary | ICD-10-CM | POA: Diagnosis not present

## 2018-05-16 DIAGNOSIS — R030 Elevated blood-pressure reading, without diagnosis of hypertension: Secondary | ICD-10-CM | POA: Diagnosis not present

## 2018-05-16 DIAGNOSIS — I739 Peripheral vascular disease, unspecified: Secondary | ICD-10-CM | POA: Diagnosis not present

## 2018-05-16 DIAGNOSIS — G546 Phantom limb syndrome with pain: Secondary | ICD-10-CM | POA: Diagnosis not present

## 2018-05-29 ENCOUNTER — Ambulatory Visit: Payer: Medicare Other | Admitting: Physical Therapy

## 2018-06-25 ENCOUNTER — Ambulatory Visit: Payer: Medicare Other | Attending: Vascular Surgery | Admitting: Physical Therapy

## 2018-09-05 DIAGNOSIS — I739 Peripheral vascular disease, unspecified: Secondary | ICD-10-CM | POA: Diagnosis not present

## 2018-09-05 DIAGNOSIS — G546 Phantom limb syndrome with pain: Secondary | ICD-10-CM | POA: Diagnosis not present

## 2018-10-23 DIAGNOSIS — G546 Phantom limb syndrome with pain: Secondary | ICD-10-CM | POA: Diagnosis not present

## 2018-10-23 DIAGNOSIS — I739 Peripheral vascular disease, unspecified: Secondary | ICD-10-CM | POA: Diagnosis not present

## 2019-03-20 DIAGNOSIS — Z0001 Encounter for general adult medical examination with abnormal findings: Secondary | ICD-10-CM | POA: Diagnosis not present

## 2019-03-20 DIAGNOSIS — Z681 Body mass index (BMI) 19 or less, adult: Secondary | ICD-10-CM | POA: Diagnosis not present

## 2019-03-20 DIAGNOSIS — I209 Angina pectoris, unspecified: Secondary | ICD-10-CM | POA: Diagnosis not present

## 2019-04-01 ENCOUNTER — Ambulatory Visit: Payer: Medicare Other | Admitting: Cardiology

## 2019-04-22 ENCOUNTER — Ambulatory Visit: Payer: Medicare Other | Admitting: Cardiology

## 2020-05-05 DIAGNOSIS — Z0001 Encounter for general adult medical examination with abnormal findings: Secondary | ICD-10-CM | POA: Diagnosis not present

## 2020-05-05 DIAGNOSIS — F329 Major depressive disorder, single episode, unspecified: Secondary | ICD-10-CM | POA: Diagnosis not present

## 2020-05-05 DIAGNOSIS — Z89611 Acquired absence of right leg above knee: Secondary | ICD-10-CM | POA: Diagnosis not present

## 2020-05-05 DIAGNOSIS — E441 Mild protein-calorie malnutrition: Secondary | ICD-10-CM | POA: Diagnosis not present

## 2020-05-05 DIAGNOSIS — Z681 Body mass index (BMI) 19 or less, adult: Secondary | ICD-10-CM | POA: Diagnosis not present

## 2020-05-05 DIAGNOSIS — Z1389 Encounter for screening for other disorder: Secondary | ICD-10-CM | POA: Diagnosis not present

## 2020-05-05 DIAGNOSIS — L308 Other specified dermatitis: Secondary | ICD-10-CM | POA: Diagnosis not present

## 2020-05-05 DIAGNOSIS — I739 Peripheral vascular disease, unspecified: Secondary | ICD-10-CM | POA: Diagnosis not present

## 2020-07-22 ENCOUNTER — Other Ambulatory Visit: Payer: Self-pay

## 2020-07-22 ENCOUNTER — Ambulatory Visit (HOSPITAL_COMMUNITY)
Admission: RE | Admit: 2020-07-22 | Discharge: 2020-07-22 | Disposition: A | Discharge status: Home / Self Care | Payer: Medicare Other | Source: Ambulatory Visit | Attending: Family Medicine | Admitting: Family Medicine

## 2020-07-22 ENCOUNTER — Other Ambulatory Visit (HOSPITAL_COMMUNITY): Payer: Self-pay | Admitting: Family Medicine

## 2020-07-22 DIAGNOSIS — I209 Angina pectoris, unspecified: Secondary | ICD-10-CM | POA: Diagnosis not present

## 2020-07-22 DIAGNOSIS — R0602 Shortness of breath: Secondary | ICD-10-CM | POA: Diagnosis not present

## 2020-07-22 DIAGNOSIS — Z89611 Acquired absence of right leg above knee: Secondary | ICD-10-CM | POA: Diagnosis not present

## 2020-07-22 DIAGNOSIS — R0600 Dyspnea, unspecified: Secondary | ICD-10-CM | POA: Diagnosis not present

## 2020-07-22 DIAGNOSIS — Z681 Body mass index (BMI) 19 or less, adult: Secondary | ICD-10-CM | POA: Diagnosis not present

## 2020-08-12 ENCOUNTER — Encounter: Payer: Self-pay | Admitting: *Deleted

## 2020-08-15 ENCOUNTER — Other Ambulatory Visit: Payer: Self-pay

## 2020-08-15 ENCOUNTER — Encounter: Payer: Self-pay | Admitting: Cardiology

## 2020-08-15 ENCOUNTER — Ambulatory Visit (INDEPENDENT_AMBULATORY_CARE_PROVIDER_SITE_OTHER): Payer: Medicare Other | Admitting: Cardiology

## 2020-08-15 VITALS — BP 112/80 | HR 81 | Ht 66.0 in | Wt 115.0 lb

## 2020-08-15 DIAGNOSIS — I209 Angina pectoris, unspecified: Secondary | ICD-10-CM

## 2020-08-15 DIAGNOSIS — J439 Emphysema, unspecified: Secondary | ICD-10-CM

## 2020-08-15 DIAGNOSIS — R0602 Shortness of breath: Secondary | ICD-10-CM | POA: Diagnosis not present

## 2020-08-15 DIAGNOSIS — I25119 Atherosclerotic heart disease of native coronary artery with unspecified angina pectoris: Secondary | ICD-10-CM

## 2020-08-15 DIAGNOSIS — E782 Mixed hyperlipidemia: Secondary | ICD-10-CM

## 2020-08-15 MED ORDER — METOPROLOL SUCCINATE ER 25 MG PO TB24: 12.5000 mg | ORAL_TABLET | Freq: Every day | ORAL | 3 refills | Status: AC

## 2020-08-15 NOTE — Progress Notes (Signed)
Cardiology Office Note  Date: 08/15/2020   ID: Edward [name], DOB [birth year], MRN [id number]  PCP:  Avis Epley, PA-C  Cardiologist:  Nona Dell, MD Electrophysiologist:  None   Chief Complaint  Patient presents with  . Chest Pain    History of Present Illness: Edward [name] is a 50 y.o. male referred for cardiology consultation by Ms. Jean Rosenthal PA-C with [facility] Lis for the evaluation of chest pain.  He is a former patient of Dr. Eden Emms and prior to that Dr. Mariah Milling, last seen in 2017.  He states that about a month ago he developed a left-sided chest discomfort and associated shortness of breath over baseline dyspnea.  He has emphysema with previous history of pneumothoraces, does not recall his previous angina symptoms.  He has continued to have intermittent discomfort in his thorax, more recently central.  He has taken nitroglycerin on a few occasions with improvement.  He states that he had lab work obtained at his most recent visit with PCP, we are requesting the results for review.  He was started on Lipitor 10 mg at that time.  Reports being on aspirin prior to that and has a fresh bottle of nitroglycerin.  I personally reviewed his ECG today which shows sinus rhythm with left atrial enlargement and rightward axis.  Past Medical History:  Diagnosis Date  . Arthritis   . Coronary artery disease    BMS to ramus 2012 [facility]), residual 50% LAD  . Depression   . DVT (deep venous thrombosis) (HCC)   . Emphysema   . History of gout   . Myocardial infarction (HCC) 2012  . PAD (peripheral artery disease) (HCC)    Occluded right femoral to popliteal bypass and subsequent right BKA  . Peripheral neuropathy   . Pneumothorax 2006   Prior chest tube placement x2  . Pulmonary embolism (HCC) 2016    Past Surgical History:  Procedure Laterality Date  . AMPUTATION Right 09/27/2015   Procedure: AMPUTATION BELOW KNEE;  Surgeon: Sherren Kerns, MD;  Location: [facility] OR;   Service: Vascular;  Laterality: Right;  . AMPUTATION Right 10/28/2015   Procedure: AMPUTATION ABOVE KNEE;  Surgeon: Sherren Kerns, MD;  Location: [facility] OR;  Service: Vascular;  Laterality: Right;  . CARDIAC CATHETERIZATION  2012   s/p stent placement  . CHEST TUBE INSERTION    . CORONARY ANGIOPLASTY WITH STENT PLACEMENT     1 stent  . FEMORAL-POPLITEAL BYPASS GRAFT Right 05/30/2015   Procedure: RIGHT SUPERFICIAL FEMORAL TO BELOW KNEE POPLITEAL ARTERY BYPASS GRAFT;  Surgeon: Sherren Kerns, MD;  Location: [facility] OR;  Service: Vascular;  Laterality: Right;  . FEMORAL-POPLITEAL BYPASS GRAFT Right 05/30/2015   Procedure: RIGHT BYPASS GRAFT FEMORAL-POPLITEAL ARTERY USING PROPATEN 44mmx80cm  GRAFT; WITH VEIN CUFF;  Surgeon: Sherren Kerns, MD;  Location: [facility] OR;  Service: Vascular;  Laterality: Right;  . FEMORAL-POPLITEAL BYPASS GRAFT Right 07/04/2015   Procedure: REDO RIGHT FEMORAL TO BELOW KNEE POPLITEAL BYPASS GRAFT USING CONTRALATERAL  REVERSED GREATER SAPHENOUS VEIN;  Surgeon: Sherren Kerns, MD;  Location: [facility] OR;  Service: Vascular;  Laterality: Right;  . I & D EXTREMITY Right 10/26/2015   Procedure: IRRIGATION AND DEBRIDEMENT BELOW KNEE AMPUTATION;  Surgeon: Sherren Kerns, MD;  Location: [facility] OR;  Service: Vascular;  Laterality: Right;  . INGUINAL HERNIA REPAIR Right ~ 2012  . INTRAOPERATIVE ARTERIOGRAM Right 05/30/2015   Procedure: INTRA OPERATIVE ARTERIOGRAM;  Surgeon: Sherren Kerns, MD;  Location: [facility] OR;  Service: Vascular;  Laterality: Right;  . INTRAOPERATIVE ARTERIOGRAM Right 05/30/2015   Procedure: INTRA OPERATIVE ARTERIOGRAM;  Surgeon: Sherren Kerns, MD;  Location: [facility] OR;  Service: Vascular;  Laterality: Right;  . INTRAOPERATIVE ARTERIOGRAM  07/04/2015   Procedure: INTRA OPERATIVE ARTERIOGRAM LEFT LOWER EXTREMITY;  Surgeon: Sherren Kerns, MD;  Location: [facility] OR;  Service: Vascular;;  . PERIPHERAL VASCULAR CATHETERIZATION N/A 05/18/2015   Procedure: Abdominal Aortogram;  Surgeon: Nada Libman, MD;  Location: [facility] INVASIVE CV LAB;  Service: Cardiovascular;  Laterality: N/A;  . THROMBECTOMY FEMORAL ARTERY Left 07/04/2015   Procedure: THROMBECTOMY LEFT  TIBIAL ARTERY;  Surgeon: Sherren Kerns, MD;  Location: [facility] OR;  Service: Vascular;  Laterality: Left;  Marland Kitchen VEIN HARVEST Right 05/30/2015   Procedure: RIGHT GREAT SAPHENOUS VEIN HARVEST;  Surgeon: Sherren Kerns, MD;  Location: [facility] OR;  Service: Vascular;  Laterality: Right;  . VEIN HARVEST Left 07/04/2015   Procedure: VEIN HARVEST LEFT GREATER SAPHENOUS ;  Surgeon: Sherren Kerns, MD;  Location: [facility] OR;  Service: Vascular;  Laterality: Left;    Current Outpatient Medications  Medication Sig Dispense Refill  . albuterol (VENTOLIN HFA) 108 (90 Base) MCG/ACT inhaler Inhale 2 puffs into the lungs every 4 (four) hours as needed.    Marland Kitchen aspirin EC 81 MG tablet Take 81 mg by mouth daily. Swallow whole.    Marland Kitchen atorvastatin (LIPITOR) 10 MG tablet Take 10 mg by mouth daily.    . metoprolol succinate (TOPROL XL) 25 MG 24 hr tablet Take 0.5 tablets (12.5 mg total) by mouth daily. 45 tablet 3  . nitroGLYCERIN (NITROSTAT) 0.4 MG SL tablet Place 0.4 mg under the tongue every 5 (five) minutes as needed for chest pain.      No current facility-administered medications for this visit.   Allergies:  Keflex [cephalexin] and Lisinopril   Social History: The patient  reports that he has been smoking cigarettes. He started smoking about 34 years ago. He has a 25.00 pack-year smoking history. He has never used smokeless tobacco. He reports previous alcohol use. He reports previous drug use. Drugs: Marijuana and Cocaine.   Family History: The patient's family history includes Brain cancer in his father; COPD in his mother.   ROS: No palpitations or syncope.  Physical Exam: VS:  BP 112/80 (BP Location: Left Arm, Patient Position: Sitting, Cuff Size: Normal)   Pulse 81   Ht 5\' 6"  (1.676 m)   Wt 115 lb (52.2 kg)   SpO2 97%   BMI 18.56 kg/m , BMI  Body mass index is 18.56 kg/m.  Wt Readings from Last 3 Encounters:  08/15/20 115 lb (52.2 kg)  02/27/18 116 lb (52.6 kg)  01/26/16 112 lb (50.8 kg)    General: Patient appears comfortable at rest. HEENT: Conjunctiva and lids normal, wearing a mask. Neck: Supple, no elevated JVP or carotid bruits, no thyromegaly. Lungs: Clear to auscultation, nonlabored breathing at rest. Cardiac: Regular rate and rhythm, no S3, 1/6 systolic murmur, no pericardial rub. Abdomen: Soft, nontender, bowel sounds present. Extremities: Status post right AKA. Skin: Warm and dry. Musculoskeletal: No kyphosis. Neuropsychiatric: Alert and oriented x3, affect grossly appropriate.  ECG:  An ECG dated 10/25/2015 was personally reviewed today and demonstrated:  Sinus tachycardia with rightward axis.  Recent Labwork:  December 2020: Cholesterol 211, HDL 74, LDL 121, triglycerides 93  Other Studies Reviewed Today:  No interval cardiac testing for review today.  Assessment and Plan:  1.  Recurrent chest pain consistent with angina.  Patient  has a known history of CAD status post BMS to the ramus intermedius in 2012 with residual 50% LAD stenosis managed medically at that time.  He has not had interval ischemic testing.  ECG reviewed and nonspecific.  Continue aspirin and statin, requesting interval lab work for review.  Also starting Toprol-XL 12.5 mg daily.  He has nitroglycerin available.  Scheduling echocardiogram and Lexiscan Myoview as next step.  2.  PAD with history of occluded right femoral to popliteal bypass and subsequent right AKA.  3.  Longstanding history of tobacco abuse.  4.  Emphysema with prior history of pneumothoraces.  5.  Prior history of DVT and pulmonary embolus, treated with course of Xarelto.  Medication Adjustments/Labs and Tests Ordered: Current medicines are reviewed at length with the patient today.  Concerns regarding medicines are outlined above.   Tests Ordered: Orders  Placed This Encounter  Procedures  . NM Myocar Multi W/Spect W/Wall Motion / EF  . EKG 12-Lead  . ECHOCARDIOGRAM COMPLETE    Medication Changes: Meds ordered this encounter  Medications  . metoprolol succinate (TOPROL XL) 25 MG 24 hr tablet    Sig: Take 0.5 tablets (12.5 mg total) by mouth daily.    Dispense:  45 tablet    Refill:  3    Disposition:  Follow up 1 month in the [place] office.  Signed, Jonelle Sidle, MD, [facility] 08/15/2020 2:03 PM    [facility] [address] [place], [place], [place] [address] Phone: [phone]; Fax: [phone]

## 2020-08-15 NOTE — Patient Instructions (Signed)
Medication Instructions:  START Toprol XL 12.5 mg daily   *If you need a refill on your cardiac medications before your next appointment, please call your pharmacy*   Lab Work: None today If you have labs (blood work) drawn today and your tests are completely normal, you will receive your results only by: Marland Kitchen MyChart Message (if you have MyChart) OR . A paper copy in the mail If you have any lab test that is abnormal or we need to change your treatment, we will call you to review the results.   Testing/Procedures: Your physician has requested that you have an echocardiogram. Echocardiography is a painless test that uses sound waves to create images of your heart. It provides your doctor with information about the size and shape of your heart and how well your heart's chambers and valves are working. This procedure takes approximately one hour. There are no restrictions for this procedure.  Your physician has requested that you have a lexiscan myoview. For further information please visit [email or web address] Please follow instruction sheet, as given.     Follow-Up: At [facility], you and your health needs are our priority.  As part of our continuing mission to provide you with exceptional heart care, we have created designated Provider Care Teams.  These Care Teams include your primary Cardiologist (physician) and Advanced Practice Providers (APPs -  Physician Assistants and Nurse Practitioners) who all work together to provide you with the care you need, when you need it.  We recommend signing up for the patient portal called "MyChart".  Sign up information is provided on this After Visit Summary.  MyChart is used to connect with patients for Virtual Visits (Telemedicine).  Patients are able to view lab/test results, encounter notes, upcoming appointments, etc.  Non-urgent messages can be sent to your provider as well.   To learn more about what you can do with MyChart, go to  [email or web address].    Your next appointment:   1 month(s)  The format for your next appointment:   In Person  Provider:   Randall An, PA-C   Other Instructions None

## 2020-08-18 ENCOUNTER — Encounter (HOSPITAL_COMMUNITY): Payer: Self-pay

## 2020-08-18 ENCOUNTER — Encounter (HOSPITAL_COMMUNITY)
Admission: RE | Admit: 2020-08-18 | Discharge: 2020-08-18 | Disposition: A | Discharge status: Home / Self Care | Payer: Medicare Other | Source: Ambulatory Visit | Attending: Cardiology | Admitting: Cardiology

## 2020-08-18 ENCOUNTER — Ambulatory Visit (HOSPITAL_COMMUNITY)
Admission: RE | Admit: 2020-08-18 | Discharge: 2020-08-18 | Disposition: A | Discharge status: Home / Self Care | Payer: Medicare Other | Source: Ambulatory Visit | Attending: Cardiology | Admitting: Cardiology

## 2020-08-18 DIAGNOSIS — I25119 Atherosclerotic heart disease of native coronary artery with unspecified angina pectoris: Secondary | ICD-10-CM | POA: Insufficient documentation

## 2020-08-18 DIAGNOSIS — R0602 Shortness of breath: Secondary | ICD-10-CM | POA: Diagnosis not present

## 2020-08-18 HISTORY — DX: Unspecified asthma, uncomplicated: J45.909

## 2020-08-18 LAB — NM MYOCAR MULTI W/SPECT W/WALL MOTION / EF
LV dias vol: 107 mL (ref 62–150)
LV sys vol: 60 mL
Peak HR: 103 {beats}/min
RATE: 0.35
Rest HR: 75 {beats}/min
SDS: 3
SRS: 3
SSS: 6
TID: 1.15

## 2020-08-18 MED ORDER — SODIUM CHLORIDE FLUSH 0.9 % IV SOLN
INTRAVENOUS | Status: AC
  Administered 2020-08-18: 10 mL via INTRAVENOUS
  Filled 2020-08-18: qty 10

## 2020-08-18 MED ORDER — TECHNETIUM TC 99M TETROFOSMIN IV KIT
30.0000 | PACK | Freq: Once | INTRAVENOUS | Status: AC | PRN
  Administered 2020-08-18: 32 via INTRAVENOUS

## 2020-08-18 MED ORDER — REGADENOSON 0.4 MG/5ML IV SOLN
INTRAVENOUS | Status: AC
  Administered 2020-08-18: 0.4 mg via INTRAVENOUS
  Filled 2020-08-18: qty 5

## 2020-08-18 MED ORDER — TECHNETIUM TC 99M TETROFOSMIN IV KIT
10.0000 | PACK | Freq: Once | INTRAVENOUS | Status: AC | PRN
  Administered 2020-08-18: 10.8 via INTRAVENOUS

## 2020-08-19 ENCOUNTER — Telehealth: Payer: Self-pay | Admitting: *Deleted

## 2020-08-19 DIAGNOSIS — K0889 Other specified disorders of teeth and supporting structures: Secondary | ICD-10-CM | POA: Diagnosis not present

## 2020-08-19 DIAGNOSIS — I739 Peripheral vascular disease, unspecified: Secondary | ICD-10-CM | POA: Diagnosis not present

## 2020-08-19 DIAGNOSIS — Z681 Body mass index (BMI) 19 or less, adult: Secondary | ICD-10-CM | POA: Diagnosis not present

## 2020-08-19 MED ORDER — ISOSORBIDE MONONITRATE ER 30 MG PO TB24: 15.0000 mg | ORAL_TABLET | Freq: Every day | ORAL | 1 refills | Status: AC

## 2020-08-19 NOTE — Telephone Encounter (Signed)
Patient informed and verbalized understanding of plan. Copy sent to PCP 

## 2020-08-19 NOTE — Telephone Encounter (Signed)
-----   Message from Jonelle Sidle, MD sent at 08/18/2020  4:36 PM EDT ----- Results reviewed.  Stress test showed mild ischemic territory in the anterior apical wall.  He has known moderate disease in the LAD by prior evaluation.  LVEF calculated at 43%, but visually appeared better.  Would suggest getting an echocardiogram to better confirm LVEF.  Continue present medications and go ahead and start Imdur at 15 mg once in the evening as additional antianginal.  Keep follow-up as scheduled.

## 2020-08-23 ENCOUNTER — Other Ambulatory Visit: Payer: Self-pay

## 2020-08-23 ENCOUNTER — Telehealth: Payer: Self-pay | Admitting: *Deleted

## 2020-08-23 ENCOUNTER — Ambulatory Visit (INDEPENDENT_AMBULATORY_CARE_PROVIDER_SITE_OTHER): Payer: Medicare Other

## 2020-08-23 DIAGNOSIS — I739 Peripheral vascular disease, unspecified: Secondary | ICD-10-CM

## 2020-08-23 DIAGNOSIS — R0602 Shortness of breath: Secondary | ICD-10-CM

## 2020-08-23 DIAGNOSIS — I25119 Atherosclerotic heart disease of native coronary artery with unspecified angina pectoris: Secondary | ICD-10-CM | POA: Diagnosis not present

## 2020-08-23 LAB — ECHOCARDIOGRAM COMPLETE
Area-P 1/2: 2.68 cm2
Calc EF: 50 %
MV M vel: 1.63 m/s
MV Peak grad: 10.6 mmHg
S' Lateral: 3.27 cm
Single Plane A2C EF: 47.6 %
Single Plane A4C EF: 49.5 %

## 2020-08-23 NOTE — Telephone Encounter (Signed)
Patient informed. Copy sent to PCP °

## 2020-08-23 NOTE — Telephone Encounter (Signed)
-----   Message from Jonelle Sidle, MD sent at 08/23/2020  4:04 PM EDT ----- Results reviewed.  LVEF low normal range at 50 to 55%.  Continue with current medication adjustments and follow-up plan.

## 2020-09-13 NOTE — Progress Notes (Deleted)
Cardiology Office Note  Date: 09/13/2020   ID: Edward [name], DOB [birth year], MRN [id number]  PCP:  Edward Epley, PA-C  Cardiologist:  Nona Dell, MD Electrophysiologist:  None   Chief Complaint: 1 month follow-up  History of Present Illness: Edward [name] is a 50 y.o. male with a history of CAD/MI, DVT, emphysema, PAD status post right BKA.  Peripheral neuropathy, PE, HLD  Last visit with Dr. Diona Browner on 08/15/2020.  He had been referred for cardiology consult for evaluation of chest pain.  A month prior patient had developed left-sided chest discomfort and associated shortness of breath over baseline dyspnea.  He continued to have intermittent discomfort described as more recently central.  He took sublingual nitroglycerin on a few occasions with improvement.  He had recent lab work with PCP and lab work was requested.  He was started on Lipitor at that time.  He was continuing aspirin, statin.  Toprol-XL 12.5 mg was started.  Echocardiogram and Lexiscan Myoview were scheduled.  History of longstanding tobacco abuse.  Prior history of pneumothoraces with emphysema.  History of DVT and PEs treated with Xarelto.  Patient had an echocardiogram and stress test.  Stress test showed mild ischemic territory in the anterior apical wall.  He has known moderate disease in LAD by prior evaluation.  LVEF calculated at 43% but visually appeared better.  Imdur 15 mg was started daily.  Echocardiogram showed low normal EF 50 to 55%.  Plan was to continue current medication and adjust meds and follow-up plan.     Past Medical History:  Diagnosis Date  . Arthritis   . Asthma   . Coronary artery disease    BMS to ramus 2012 [facility]), residual 50% LAD  . Depression   . DVT (deep venous thrombosis) (HCC)   . Emphysema   . History of gout   . Myocardial infarction (HCC) 2012  . PAD (peripheral artery disease) (HCC)    Occluded right femoral to popliteal bypass and subsequent right BKA  .  Peripheral neuropathy   . Pneumothorax 2006   Prior chest tube placement x2  . Pulmonary embolism (HCC) 2016    Past Surgical History:  Procedure Laterality Date  . AMPUTATION Right 09/27/2015   Procedure: AMPUTATION BELOW KNEE;  Surgeon: Sherren Kerns, MD;  Location: [facility] OR;  Service: Vascular;  Laterality: Right;  . AMPUTATION Right 10/28/2015   Procedure: AMPUTATION ABOVE KNEE;  Surgeon: Sherren Kerns, MD;  Location: [facility] OR;  Service: Vascular;  Laterality: Right;  . CARDIAC CATHETERIZATION  2012   s/p stent placement  . CHEST TUBE INSERTION    . CORONARY ANGIOPLASTY WITH STENT PLACEMENT     1 stent  . FEMORAL-POPLITEAL BYPASS GRAFT Right 05/30/2015   Procedure: RIGHT SUPERFICIAL FEMORAL TO BELOW KNEE POPLITEAL ARTERY BYPASS GRAFT;  Surgeon: Sherren Kerns, MD;  Location: [facility] OR;  Service: Vascular;  Laterality: Right;  . FEMORAL-POPLITEAL BYPASS GRAFT Right 05/30/2015   Procedure: RIGHT BYPASS GRAFT FEMORAL-POPLITEAL ARTERY USING PROPATEN 50mmx80cm  GRAFT; WITH VEIN CUFF;  Surgeon: Sherren Kerns, MD;  Location: [facility] OR;  Service: Vascular;  Laterality: Right;  . FEMORAL-POPLITEAL BYPASS GRAFT Right 07/04/2015   Procedure: REDO RIGHT FEMORAL TO BELOW KNEE POPLITEAL BYPASS GRAFT USING CONTRALATERAL  REVERSED GREATER SAPHENOUS VEIN;  Surgeon: Sherren Kerns, MD;  Location: [facility] OR;  Service: Vascular;  Laterality: Right;  . I & D EXTREMITY Right 10/26/2015   Procedure: IRRIGATION AND DEBRIDEMENT BELOW KNEE AMPUTATION;  Surgeon: Leonette Most  Holland Commons, MD;  Location: [facility] OR;  Service: Vascular;  Laterality: Right;  . INGUINAL HERNIA REPAIR Right ~ 2012  . INTRAOPERATIVE ARTERIOGRAM Right 05/30/2015   Procedure: INTRA OPERATIVE ARTERIOGRAM;  Surgeon: Sherren Kerns, MD;  Location: [facility] OR;  Service: Vascular;  Laterality: Right;  . INTRAOPERATIVE ARTERIOGRAM Right 05/30/2015   Procedure: INTRA OPERATIVE ARTERIOGRAM;  Surgeon: Sherren Kerns, MD;  Location: [facility] OR;  Service: Vascular;  Laterality:  Right;  . INTRAOPERATIVE ARTERIOGRAM  07/04/2015   Procedure: INTRA OPERATIVE ARTERIOGRAM LEFT LOWER EXTREMITY;  Surgeon: Sherren Kerns, MD;  Location: [facility] OR;  Service: Vascular;;  . PERIPHERAL VASCULAR CATHETERIZATION N/A 05/18/2015   Procedure: Abdominal Aortogram;  Surgeon: Nada Libman, MD;  [facility]: [facility] INVASIVE CV LAB;  Service: Cardiovascular;  Laterality: N/A;  . THROMBECTOMY FEMORAL ARTERY Left 07/04/2015   Procedure: THROMBECTOMY LEFT  TIBIAL ARTERY;  Surgeon: Sherren Kerns, MD;  Location: [facility] OR;  Service: Vascular;  Laterality: Left;  Marland Kitchen VEIN HARVEST Right 05/30/2015   Procedure: RIGHT GREAT SAPHENOUS VEIN HARVEST;  Surgeon: Sherren Kerns, MD;  Location: [facility] OR;  Service: Vascular;  Laterality: Right;  . VEIN HARVEST Left 07/04/2015   Procedure: VEIN HARVEST LEFT GREATER SAPHENOUS ;  Surgeon: Sherren Kerns, MD;  Location: [facility] OR;  Service: Vascular;  Laterality: Left;    Current Outpatient Medications  Medication Sig Dispense Refill  . albuterol (VENTOLIN HFA) 108 (90 Base) MCG/ACT inhaler Inhale 2 puffs into the lungs every 4 (four) hours as needed.    Marland Kitchen aspirin EC 81 MG tablet Take 81 mg by mouth daily. Swallow whole.    Marland Kitchen atorvastatin (LIPITOR) 10 MG tablet Take 10 mg by mouth daily.    . isosorbide mononitrate (IMDUR) 30 MG 24 hr tablet Take 0.5 tablets (15 mg total) by mouth daily. 45 tablet 1  . metoprolol succinate (TOPROL XL) 25 MG 24 hr tablet Take 0.5 tablets (12.5 mg total) by mouth daily. 45 tablet 3  . nitroGLYCERIN (NITROSTAT) 0.4 MG SL tablet Place 0.4 mg under the tongue every 5 (five) minutes as needed for chest pain.      No current facility-administered medications for this visit.   Allergies:  Keflex [cephalexin] and Lisinopril   Social History: The patient  reports that he has been smoking cigarettes. He started smoking about 34 years ago. He has a 25.00 pack-year smoking history. He has never used smokeless tobacco. He reports previous alcohol use. He  reports previous drug use. Drugs: Marijuana and Cocaine.   Family History: The patient's family history includes Brain cancer in his father; COPD in his mother.   ROS:  Please see the history of present illness. Otherwise, complete review of systems is positive for none.  All other systems are reviewed and negative.   Physical Exam: VS:  There were no vitals taken for this visit., BMI There is no height or weight on file to calculate BMI.  Wt Readings from Last 3 Encounters:  08/15/20 115 lb (52.2 kg)  02/27/18 116 lb (52.6 kg)  01/26/16 112 lb (50.8 kg)    General: Patient appears comfortable at rest. HEENT: Conjunctiva and lids normal, oropharynx clear with moist mucosa. Neck: Supple, no elevated JVP or carotid bruits, no thyromegaly. Lungs: Clear to auscultation, nonlabored breathing at rest. Cardiac: Regular rate and rhythm, no S3 or significant systolic murmur, no pericardial rub. Abdomen: Soft, nontender, no hepatomegaly, bowel sounds present, no guarding or rebound. Extremities: No pitting edema, distal pulses 2+. Skin:  Warm and dry. Musculoskeletal: No kyphosis. Neuropsychiatric: Alert and oriented x3, affect grossly appropriate.  ECG:  {EKG/Telemetry Strips Reviewed:[phone]}  Recent Labwork: No results found for requested labs within last 8760 hours.  No results found for: CHOL, TRIG, HDL, CHOLHDL, VLDL, LDLCALC, LDLDIRECT  Other Studies Reviewed Today:   Lexiscan Myoview 08/18/2020  Narrative & Impression   No diagnostic ST segment changes to suggest ischemia.  Small, mild intensity, anteroapical defect that is partially reversible and consistent with a small region of ischemia.  This is an intermediate risk study based on calculated LVEF - suggest echocardiogram for confirmation.  Nuclear stress EF: 43%.     Echocardiogram 08/23/2020  1. Left ventricular ejection fraction, by estimation, is 50 to 55%. The left ventricle has low normal function. The left  ventricle demonstrates regional wall motion abnormalities (see scoring diagram/findings for description). Left ventricular diastolic parameters were normal. 2. Right ventricular systolic function is normal. The right ventricular size is normal. 3. The mitral valve is grossly normal. Trivial mitral valve regurgitation. 4. The aortic valve is tricuspid. Aortic valve regurgitation is not visualized. 5. The inferior vena cava is normal in size with greater than 50% respiratory variability, suggesting right atrial pressure of 3 mmHg.  Assessment and Plan:  1. Angina pectoris (HCC)   2. CAD in native artery   3. PAD (peripheral artery disease) (HCC)   4. Tobacco abuse      Medication Adjustments/Labs and Tests Ordered: Current medicines are reviewed at length with the patient today.  Concerns regarding medicines are outlined above.   Disposition: Follow-up with ***  Signed, Rennis Harding, NP 09/13/2020 5:00 PM    [facility] at [facility] [address] [place], [place], [place] [address] Phone: [phone]; Fax: [phone]

## 2020-09-14 ENCOUNTER — Ambulatory Visit: Payer: Medicare Other | Admitting: Family Medicine

## 2020-09-14 ENCOUNTER — Ambulatory Visit: Payer: Medicare Other | Admitting: Cardiology

## 2020-09-14 DIAGNOSIS — I209 Angina pectoris, unspecified: Secondary | ICD-10-CM

## 2020-09-14 DIAGNOSIS — I739 Peripheral vascular disease, unspecified: Secondary | ICD-10-CM

## 2020-09-14 DIAGNOSIS — I2510 Atherosclerotic heart disease of native coronary artery without angina pectoris: Secondary | ICD-10-CM

## 2020-09-14 DIAGNOSIS — Z720 Tobacco use: Secondary | ICD-10-CM

## 2020-09-15 ENCOUNTER — Ambulatory Visit: Payer: Medicare Other

## 2020-09-15 ENCOUNTER — Ambulatory Visit (HOSPITAL_COMMUNITY): Payer: Medicare Other | Attending: Vascular Surgery

## 2020-09-20 ENCOUNTER — Other Ambulatory Visit: Payer: Medicare Other

## 2020-09-22 ENCOUNTER — Other Ambulatory Visit: Payer: Medicare Other

## 2020-09-22 ENCOUNTER — Other Ambulatory Visit: Payer: Self-pay

## 2020-09-22 DIAGNOSIS — I739 Peripheral vascular disease, unspecified: Secondary | ICD-10-CM

## 2020-09-22 NOTE — Addendum Note (Signed)
Addended byWaynetta Pean on: 09/22/2020 08:47 PM   Modules accepted: Orders

## 2020-09-26 ENCOUNTER — Ambulatory Visit: Payer: Medicare Other | Admitting: Family Medicine

## 2020-10-05 ENCOUNTER — Ambulatory Visit (HOSPITAL_COMMUNITY)
Admission: RE | Admit: 2020-10-05 | Discharge: 2020-10-05 | Disposition: A | Discharge status: Home / Self Care | Payer: Medicare Other | Source: Ambulatory Visit | Attending: Vascular Surgery | Admitting: Vascular Surgery

## 2020-10-05 ENCOUNTER — Ambulatory Visit (INDEPENDENT_AMBULATORY_CARE_PROVIDER_SITE_OTHER): Payer: Medicare Other | Admitting: Physician Assistant

## 2020-10-05 ENCOUNTER — Other Ambulatory Visit: Payer: Self-pay

## 2020-10-05 VITALS — BP 138/83 | HR 81 | Temp 96.8°F | Resp 14 | Ht 67.0 in | Wt 112.6 lb

## 2020-10-05 DIAGNOSIS — I739 Peripheral vascular disease, unspecified: Secondary | ICD-10-CM | POA: Diagnosis not present

## 2020-10-05 DIAGNOSIS — F17200 Nicotine dependence, unspecified, uncomplicated: Secondary | ICD-10-CM

## 2020-10-05 DIAGNOSIS — I209 Angina pectoris, unspecified: Secondary | ICD-10-CM | POA: Diagnosis not present

## 2020-10-05 NOTE — Progress Notes (Signed)
HISTORY AND PHYSICAL     CC:  follow up. Requesting Provider:  Avis Epley, PA*  HPI: This is a 50 y.o. male who is here today for follow up for PAD.  He has hx of RLE bypass in February 2017 by Dr. Darrick Penna.  He had acute occlusion and underwent redo bypass with Propaten later that day.   A month later, he had rest pain in the right foot and underwent redo bypass with contralateral GSV from left leg.  In June 2017, his bypass was occluded again and he subsequently underwent right BKA.   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  He has hx of carotid duplex in 2017 that revealed bilateral ICA stenosis of 1-39%.  He was seen in the APP clinic in November 2019 for evaluation of a new right transfemoral prosthesis.  His amp site was healing well and he was referred for new micro processing technology.  The pt returns today for follow up.   He states that he has had some cramping in the left calf with walking but it is not consistent.  He has not had any non healing wounds on the left foot.  He states that he does get some pain in his left foot at night that last about 30-40 minutes but he is able to go back to sleep.  He has had a couple of prosthesis since his last visit and he is using the prosthesis.  He continues to smoke.   He states that he will get occasional swelling in the LLE.  The swelling improves after he has been in bed at night.  His left calf is not tender or painful.    The pt is followed by [facility] for hx of chest pain.  Recent echo revealed LVEF of 50-55%.  He was seen by them in May 2022 and it was recommended to get echo and stress test.    He has hx of DVT and PE and was treated with Xarelto.    The pt is on a statin for cholesterol management.    The pt is on an aspirin.    Other AC:  none The pt is on BB for hypertension.  The pt does not have diabetes. Tobacco hx:  current smoker and has hx of emphysema with hx of  pneumothoraces.   Pt does not have family hx of AAA.  Past Medical History:  Diagnosis Date   Arthritis    Asthma    Coronary artery disease    BMS to ramus 2012 [facility]), residual 50% LAD   Depression    DVT (deep venous thrombosis) (HCC)    Emphysema    History of gout    Myocardial infarction (HCC) 2012   PAD (peripheral artery disease) (HCC)    Occluded right femoral to popliteal bypass and subsequent right BKA   Peripheral neuropathy    Pneumothorax 2006   Prior chest tube placement x2   Pulmonary embolism (HCC) 2016    Past Surgical History:  Procedure Laterality Date   AMPUTATION Right 09/27/2015   Procedure: AMPUTATION BELOW KNEE;  Surgeon: Sherren Kerns, MD;  Location: [facility] OR;  Service: Vascular;  Laterality: Right;   AMPUTATION Right 10/28/2015   Procedure: AMPUTATION ABOVE KNEE;  Surgeon: Sherren Kerns, MD;  Location: [facility] OR;  Service: Vascular;  Laterality: Right;   CARDIAC CATHETERIZATION  2012   s/p stent placement   CHEST TUBE INSERTION  CORONARY ANGIOPLASTY WITH STENT PLACEMENT     1 stent   FEMORAL-POPLITEAL BYPASS GRAFT Right 05/30/2015   Procedure: RIGHT SUPERFICIAL FEMORAL TO BELOW KNEE POPLITEAL ARTERY BYPASS GRAFT;  Surgeon: Sherren Kerns, MD;  Location: [facility] OR;  Service: Vascular;  Laterality: Right;   FEMORAL-POPLITEAL BYPASS GRAFT Right 05/30/2015   Procedure: RIGHT BYPASS GRAFT FEMORAL-POPLITEAL ARTERY USING PROPATEN 55mmx80cm  GRAFT; WITH VEIN CUFF;  Surgeon: Sherren Kerns, MD;  Location: [facility] OR;  Service: Vascular;  Laterality: Right;   FEMORAL-POPLITEAL BYPASS GRAFT Right 07/04/2015   Procedure: REDO RIGHT FEMORAL TO BELOW KNEE POPLITEAL BYPASS GRAFT USING CONTRALATERAL  REVERSED GREATER SAPHENOUS VEIN;  Surgeon: Sherren Kerns, MD;  Location: [facility] OR;  Service: Vascular;  Laterality: Right;   I & D EXTREMITY Right 10/26/2015   Procedure: IRRIGATION AND DEBRIDEMENT BELOW KNEE AMPUTATION;  Surgeon: Sherren Kerns, MD;  Location: [facility] OR;   Service: Vascular;  Laterality: Right;   INGUINAL HERNIA REPAIR Right ~ 2012   INTRAOPERATIVE ARTERIOGRAM Right 05/30/2015   Procedure: INTRA OPERATIVE ARTERIOGRAM;  Surgeon: Sherren Kerns, MD;  Location: [facility] OR;  Service: Vascular;  Laterality: Right;   INTRAOPERATIVE ARTERIOGRAM Right 05/30/2015   Procedure: INTRA OPERATIVE ARTERIOGRAM;  Surgeon: Sherren Kerns, MD;  Location: [facility] OR;  Service: Vascular;  Laterality: Right;   INTRAOPERATIVE ARTERIOGRAM  07/04/2015   Procedure: INTRA OPERATIVE ARTERIOGRAM LEFT LOWER EXTREMITY;  Surgeon: Sherren Kerns, MD;  Location: [facility] OR;  Service: Vascular;;   PERIPHERAL VASCULAR CATHETERIZATION N/A 05/18/2015   Procedure: Abdominal Aortogram;  Surgeon: Nada Libman, MD;  Location: [facility] INVASIVE CV LAB;  Service: Cardiovascular;  Laterality: N/A;   THROMBECTOMY FEMORAL ARTERY Left 07/04/2015   Procedure: THROMBECTOMY LEFT  TIBIAL ARTERY;  Surgeon: Sherren Kerns, MD;  Location: [facility] OR;  Service: Vascular;  Laterality: Left;   VEIN HARVEST Right 05/30/2015   Procedure: RIGHT GREAT SAPHENOUS VEIN HARVEST;  Surgeon: Sherren Kerns, MD;  Location: [facility] OR;  Service: Vascular;  Laterality: Right;   VEIN HARVEST Left 07/04/2015   Procedure: VEIN HARVEST LEFT GREATER SAPHENOUS ;  Surgeon: Sherren Kerns, MD;  Location: [facility] OR;  Service: Vascular;  Laterality: Left;    Allergies  Allergen Reactions   Keflex [Cephalexin] Nausea And Vomiting   Lisinopril Other (See Comments)    HYPOTENSION    Current Outpatient Medications  Medication Sig Dispense Refill   albuterol (VENTOLIN HFA) 108 (90 Base) MCG/ACT inhaler Inhale 2 puffs into the lungs every 4 (four) hours as needed.     aspirin EC 81 MG tablet Take 81 mg by mouth daily. Swallow whole.     atorvastatin (LIPITOR) 10 MG tablet Take 10 mg by mouth daily.     isosorbide mononitrate (IMDUR) 30 MG 24 hr tablet Take 0.5 tablets (15 mg total) by mouth daily. 45 tablet 1   metoprolol succinate (TOPROL XL) 25 MG  24 hr tablet Take 0.5 tablets (12.5 mg total) by mouth daily. 45 tablet 3   nitroGLYCERIN (NITROSTAT) 0.4 MG SL tablet Place 0.4 mg under the tongue every 5 (five) minutes as needed for chest pain.      No current facility-administered medications for this visit.    Family History  Problem Relation Age of Onset   COPD Mother    Brain cancer Father     Social History   Socioeconomic History   Marital status: Divorced    Spouse name: Not on file   Number of children: Not on file  Years of education: Not on file   Highest education level: Not on file  Occupational History   Not on file  Tobacco Use   Smoking status: Every Day    Packs/day: 1.00    Years: 25.00    Pack years: 25.00    Types: Cigarettes    Start date: 01/25/1986   Smokeless tobacco: Never  Substance and Sexual Activity   Alcohol use: Not Currently    Alcohol/week: 0.0 standard drinks   Drug use: Not Currently    Types: Marijuana, Cocaine    Comment: 10/17/2015 "last drug use was in the 1990s"   Sexual activity: Not on file  Other Topics Concern   Not on file  Social History Narrative   Not on file   Social Determinants of Health   Financial Resource Strain: Not on file  Food Insecurity: Not on file  Transportation Needs: Not on file  Physical Activity: Not on file  Stress: Not on file  Social Connections: Not on file  Intimate Partner Violence: Not on file     REVIEW OF SYSTEMS:   [X]  denotes positive finding, [ ]  denotes negative finding Cardiac  Comments:  Chest pain or chest pressure:    Shortness of breath upon exertion:    Short of breath when lying flat:    Irregular heart rhythm:        Vascular    Pain in calf, thigh, or hip brought on by ambulation:    Pain in feet at night that wakes you up from your sleep:     Blood clot in your veins:    Leg swelling:         Pulmonary    Oxygen at home:    Productive cough:     Wheezing:         Neurologic    Sudden weakness in arms or  legs:     Sudden numbness in arms or legs:     Sudden onset of difficulty speaking or slurred speech:    Temporary loss of vision in one eye:     Problems with dizziness:         Gastrointestinal    Blood in stool:     Vomited blood:         Genitourinary    Burning when urinating:     Blood in urine:        Psychiatric    Major depression:         Hematologic    Bleeding problems:    Problems with blood clotting too easily:        Skin    Rashes or ulcers:        Constitutional    Fever or chills:      PHYSICAL EXAMINATION:  Today's Vitals   10/05/20 1133 10/05/20 1135  BP: 138/83   Pulse: 81   Resp: 14   Temp: (!) 96.8 F (36 C)   TempSrc: Temporal   SpO2: 99%   Weight: 112 lb 9.6 oz (51.1 kg)   Height: 5\' 7"  (1.702 m)   PainSc: 0-No pain 0-No pain   Body mass index is 17.64 kg/m.   General:  WDWN in NAD; vital signs documented above Gait: Not observed HENT: WNL, normocephalic Pulmonary: normal non-labored breathing , without wheezing Cardiac: regular HR, without  Murmur; without carotid bruits Abdomen: soft, NT, no masses; aortic pulse is not palpable Skin: without rashes Vascular Exam/Pulses:  Right Left  Radial 2+ (normal) 2+ (  normal)  Femoral 2+ (normal) 2+ (normal)  DP AKA 2+ (normal)  PT AKA Monophasic doppler  Peroneal AKA Brisk monophasic    Extremities: without ischemic changes, without Gangrene , without cellulitis; without open wounds Musculoskeletal: no muscle wasting or atrophy  Neurologic: A&O X 3;  No focal weakness or paresthesias are detected Psychiatric:  The pt has Normal affect.   Non-Invasive Vascular Imaging:   ABI's/TBI's on 10/05/2020: Right:  AKA Left:  1.07/0.54 - Great toe pressure: 75  Previous ABI's/TBI's on 09/22/2015: Right:  0.9  Left:  1.1   ASSESSMENT/PLAN:: 50 y.o. male here for follow up for PAD with hx of RLE bypass x 3 with subsequent AKA  PAD -pt's ABI today within normal range.  He has a palpable  left DP pulse.   His ABI today is 1.07 with biphasic waveforms.    He does describe occasional cramping in the left calf with walking but it is not consistent.  He does have occasional pain at night that will wake him up and lasts for about 30-40 minutes and he is able to go back to sleep.  He does not have non healing wounds.  -discussed with pt to continue with walking everyday to help with creation of new blood vessels.   He will continue his asa/statin.  Current smoker -discussion about importance of smoking cessation and risk of limb loss given he has already had AKA.  He states he feels he is pre-diabetic and discussed that in diabetics, there arteries are already smaller and continued smoking would put him at even greater risk.    -pt will f/u in 6 months with ABI.  He knows to call sooner if he has any rest pain or non healing wounds before then.     Doreatha Massed, [facility] [phone]  Clinic MD:   Darrick Penna

## 2020-10-06 ENCOUNTER — Other Ambulatory Visit: Payer: Self-pay

## 2020-10-06 DIAGNOSIS — I739 Peripheral vascular disease, unspecified: Secondary | ICD-10-CM

## 2020-10-28 DIAGNOSIS — Z681 Body mass index (BMI) 19 or less, adult: Secondary | ICD-10-CM | POA: Diagnosis not present

## 2020-10-28 DIAGNOSIS — F321 Major depressive disorder, single episode, moderate: Secondary | ICD-10-CM | POA: Diagnosis not present

## 2020-10-28 DIAGNOSIS — J449 Chronic obstructive pulmonary disease, unspecified: Secondary | ICD-10-CM | POA: Diagnosis not present

## 2021-06-08 DIAGNOSIS — F17290 Nicotine dependence, other tobacco product, uncomplicated: Secondary | ICD-10-CM | POA: Diagnosis not present

## 2021-06-08 DIAGNOSIS — F321 Major depressive disorder, single episode, moderate: Secondary | ICD-10-CM | POA: Diagnosis not present

## 2021-06-08 DIAGNOSIS — Z1331 Encounter for screening for depression: Secondary | ICD-10-CM | POA: Diagnosis not present

## 2021-06-08 DIAGNOSIS — Z681 Body mass index (BMI) 19 or less, adult: Secondary | ICD-10-CM | POA: Diagnosis not present

## 2021-06-08 DIAGNOSIS — Z0000 Encounter for general adult medical examination without abnormal findings: Secondary | ICD-10-CM | POA: Diagnosis not present

## 2021-06-08 DIAGNOSIS — I739 Peripheral vascular disease, unspecified: Secondary | ICD-10-CM | POA: Diagnosis not present

## 2021-06-08 DIAGNOSIS — J449 Chronic obstructive pulmonary disease, unspecified: Secondary | ICD-10-CM | POA: Diagnosis not present

## 2021-06-08 DIAGNOSIS — E441 Mild protein-calorie malnutrition: Secondary | ICD-10-CM | POA: Diagnosis not present

## 2021-12-28 DIAGNOSIS — I209 Angina pectoris, unspecified: Secondary | ICD-10-CM | POA: Diagnosis not present

## 2021-12-28 DIAGNOSIS — S88111S Complete traumatic amputation at level between knee and ankle, right lower leg, sequela: Secondary | ICD-10-CM | POA: Diagnosis not present

## 2021-12-28 DIAGNOSIS — F17290 Nicotine dependence, other tobacco product, uncomplicated: Secondary | ICD-10-CM | POA: Diagnosis not present

## 2021-12-28 DIAGNOSIS — E7849 Other hyperlipidemia: Secondary | ICD-10-CM | POA: Diagnosis not present

## 2021-12-28 DIAGNOSIS — Z6820 Body mass index (BMI) 20.0-20.9, adult: Secondary | ICD-10-CM | POA: Diagnosis not present

## 2021-12-28 DIAGNOSIS — F321 Major depressive disorder, single episode, moderate: Secondary | ICD-10-CM | POA: Diagnosis not present

## 2021-12-28 DIAGNOSIS — E782 Mixed hyperlipidemia: Secondary | ICD-10-CM | POA: Diagnosis not present

## 2021-12-28 DIAGNOSIS — I739 Peripheral vascular disease, unspecified: Secondary | ICD-10-CM | POA: Diagnosis not present

## 2021-12-28 DIAGNOSIS — J449 Chronic obstructive pulmonary disease, unspecified: Secondary | ICD-10-CM | POA: Diagnosis not present

## 2022-08-24 DIAGNOSIS — Z125 Encounter for screening for malignant neoplasm of prostate: Secondary | ICD-10-CM | POA: Diagnosis not present

## 2022-08-24 DIAGNOSIS — J449 Chronic obstructive pulmonary disease, unspecified: Secondary | ICD-10-CM | POA: Diagnosis not present

## 2022-08-24 DIAGNOSIS — Z6820 Body mass index (BMI) 20.0-20.9, adult: Secondary | ICD-10-CM | POA: Diagnosis not present

## 2022-08-24 DIAGNOSIS — S88111S Complete traumatic amputation at level between knee and ankle, right lower leg, sequela: Secondary | ICD-10-CM | POA: Diagnosis not present

## 2022-08-24 DIAGNOSIS — E7849 Other hyperlipidemia: Secondary | ICD-10-CM | POA: Diagnosis not present

## 2022-08-24 DIAGNOSIS — Z1331 Encounter for screening for depression: Secondary | ICD-10-CM | POA: Diagnosis not present

## 2022-08-24 DIAGNOSIS — I739 Peripheral vascular disease, unspecified: Secondary | ICD-10-CM | POA: Diagnosis not present

## 2022-08-24 DIAGNOSIS — Z0001 Encounter for general adult medical examination with abnormal findings: Secondary | ICD-10-CM | POA: Diagnosis not present

## 2022-08-24 DIAGNOSIS — F17290 Nicotine dependence, other tobacco product, uncomplicated: Secondary | ICD-10-CM | POA: Diagnosis not present

## 2022-08-24 DIAGNOSIS — F321 Major depressive disorder, single episode, moderate: Secondary | ICD-10-CM | POA: Diagnosis not present

## 2022-08-24 DIAGNOSIS — E782 Mixed hyperlipidemia: Secondary | ICD-10-CM | POA: Diagnosis not present

## 2022-09-07 DIAGNOSIS — E871 Hypo-osmolality and hyponatremia: Secondary | ICD-10-CM | POA: Diagnosis not present

## 2023-05-02 DIAGNOSIS — J449 Chronic obstructive pulmonary disease, unspecified: Secondary | ICD-10-CM | POA: Diagnosis not present

## 2023-05-02 DIAGNOSIS — Z6820 Body mass index (BMI) 20.0-20.9, adult: Secondary | ICD-10-CM | POA: Diagnosis not present

## 2023-05-02 DIAGNOSIS — F321 Major depressive disorder, single episode, moderate: Secondary | ICD-10-CM | POA: Diagnosis not present

## 2023-05-02 DIAGNOSIS — S88111S Complete traumatic amputation at level between knee and ankle, right lower leg, sequela: Secondary | ICD-10-CM | POA: Diagnosis not present

## 2023-10-25 DIAGNOSIS — Z125 Encounter for screening for malignant neoplasm of prostate: Secondary | ICD-10-CM | POA: Diagnosis not present

## 2023-10-25 DIAGNOSIS — Z0001 Encounter for general adult medical examination with abnormal findings: Secondary | ICD-10-CM | POA: Diagnosis not present

## 2023-10-25 DIAGNOSIS — Z1331 Encounter for screening for depression: Secondary | ICD-10-CM | POA: Diagnosis not present

## 2023-10-25 DIAGNOSIS — J449 Chronic obstructive pulmonary disease, unspecified: Secondary | ICD-10-CM | POA: Diagnosis not present

## 2023-10-25 DIAGNOSIS — E7849 Other hyperlipidemia: Secondary | ICD-10-CM | POA: Diagnosis not present

## 2023-10-25 DIAGNOSIS — F17290 Nicotine dependence, other tobacco product, uncomplicated: Secondary | ICD-10-CM | POA: Diagnosis not present

## 2023-10-25 DIAGNOSIS — S88111S Complete traumatic amputation at level between knee and ankle, right lower leg, sequela: Secondary | ICD-10-CM | POA: Diagnosis not present

## 2023-10-25 DIAGNOSIS — E782 Mixed hyperlipidemia: Secondary | ICD-10-CM | POA: Diagnosis not present

## 2023-10-25 DIAGNOSIS — Z6820 Body mass index (BMI) 20.0-20.9, adult: Secondary | ICD-10-CM | POA: Diagnosis not present
